# Patient Record
Sex: Female | Born: 1980 | Race: White | Hispanic: No | Smoking: Former smoker
Health system: Southern US, Community
[De-identification: ages and names within clinical notes are randomized; demographics above are authoritative.]

## PROBLEM LIST (undated history)

## (undated) ENCOUNTER — Inpatient Hospital Stay (HOSPITAL_COMMUNITY): Payer: Self-pay

## (undated) DIAGNOSIS — G709 Myoneural disorder, unspecified: Secondary | ICD-10-CM

## (undated) DIAGNOSIS — E669 Obesity, unspecified: Secondary | ICD-10-CM

## (undated) DIAGNOSIS — F319 Bipolar disorder, unspecified: Secondary | ICD-10-CM

## (undated) DIAGNOSIS — I10 Essential (primary) hypertension: Secondary | ICD-10-CM

## (undated) DIAGNOSIS — F32A Depression, unspecified: Secondary | ICD-10-CM

## (undated) DIAGNOSIS — Z8489 Family history of other specified conditions: Secondary | ICD-10-CM

## (undated) DIAGNOSIS — E119 Type 2 diabetes mellitus without complications: Secondary | ICD-10-CM

## (undated) DIAGNOSIS — K219 Gastro-esophageal reflux disease without esophagitis: Secondary | ICD-10-CM

## (undated) DIAGNOSIS — K760 Fatty (change of) liver, not elsewhere classified: Secondary | ICD-10-CM

## (undated) DIAGNOSIS — IMO0002 Reserved for concepts with insufficient information to code with codable children: Secondary | ICD-10-CM

## (undated) DIAGNOSIS — F329 Major depressive disorder, single episode, unspecified: Secondary | ICD-10-CM

## (undated) DIAGNOSIS — Z9851 Tubal ligation status: Secondary | ICD-10-CM

## (undated) DIAGNOSIS — O34219 Maternal care for unspecified type scar from previous cesarean delivery: Secondary | ICD-10-CM

## (undated) DIAGNOSIS — S329XXA Fracture of unspecified parts of lumbosacral spine and pelvis, initial encounter for closed fracture: Secondary | ICD-10-CM

## (undated) DIAGNOSIS — Z98891 History of uterine scar from previous surgery: Secondary | ICD-10-CM

## (undated) DIAGNOSIS — F419 Anxiety disorder, unspecified: Secondary | ICD-10-CM

## (undated) HISTORY — DX: Fatty (change of) liver, not elsewhere classified: K76.0

## (undated) HISTORY — DX: Obesity, unspecified: E66.9

## (undated) HISTORY — DX: Type 2 diabetes mellitus without complications: E11.9

## (undated) HISTORY — PX: CHEST TUBE INSERTION: SHX231

## (undated) HISTORY — PX: TONSILLECTOMY: SUR1361

## (undated) HISTORY — DX: Gastro-esophageal reflux disease without esophagitis: K21.9

## (undated) HISTORY — PX: TRACHEOSTOMY: SUR1362

---

## 2003-06-22 ENCOUNTER — Other Ambulatory Visit: Admission: RE | Admit: 2003-06-22 | Discharge: 2003-06-22 | Payer: Self-pay | Admitting: Obstetrics and Gynecology

## 2003-08-06 ENCOUNTER — Encounter: Admission: RE | Admit: 2003-08-06 | Discharge: 2003-08-07 | Payer: Self-pay | Admitting: Endocrinology

## 2004-02-25 ENCOUNTER — Emergency Department: Payer: Self-pay | Admitting: Emergency Medicine

## 2005-02-20 ENCOUNTER — Emergency Department: Payer: Self-pay | Admitting: Internal Medicine

## 2006-05-07 ENCOUNTER — Ambulatory Visit: Payer: Self-pay | Admitting: Vascular Surgery

## 2006-05-07 ENCOUNTER — Ambulatory Visit (HOSPITAL_COMMUNITY): Admission: RE | Admit: 2006-05-07 | Discharge: 2006-05-07 | Payer: Self-pay | Admitting: Obstetrics and Gynecology

## 2006-05-07 ENCOUNTER — Encounter: Payer: Self-pay | Admitting: Vascular Surgery

## 2006-09-01 ENCOUNTER — Inpatient Hospital Stay (HOSPITAL_COMMUNITY): Admission: AD | Admit: 2006-09-01 | Discharge: 2006-09-01 | Payer: Self-pay | Admitting: Obstetrics and Gynecology

## 2006-09-03 ENCOUNTER — Inpatient Hospital Stay (HOSPITAL_COMMUNITY): Admission: AD | Admit: 2006-09-03 | Discharge: 2006-09-03 | Payer: Self-pay | Admitting: Obstetrics and Gynecology

## 2006-09-04 ENCOUNTER — Inpatient Hospital Stay (HOSPITAL_COMMUNITY): Admission: AD | Admit: 2006-09-04 | Discharge: 2006-09-04 | Payer: Self-pay | Admitting: Obstetrics and Gynecology

## 2006-09-26 ENCOUNTER — Inpatient Hospital Stay (HOSPITAL_COMMUNITY): Admission: AD | Admit: 2006-09-26 | Discharge: 2006-09-26 | Payer: Self-pay | Admitting: Obstetrics and Gynecology

## 2006-09-28 ENCOUNTER — Inpatient Hospital Stay (HOSPITAL_COMMUNITY): Admission: RE | Admit: 2006-09-28 | Discharge: 2006-09-30 | Payer: Self-pay | Admitting: Obstetrics and Gynecology
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
## 2010-10-23 LAB — CBC
Platelets: 234
Platelets: 242
WBC: 11.5 — ABNORMAL HIGH
WBC: 9.3

## 2010-10-23 LAB — RPR: RPR Ser Ql: NONREACTIVE

## 2010-10-23 LAB — SAMPLE TO BLOOD BANK

## 2010-10-23 LAB — CCBB MATERNAL DONOR DRAW

## 2010-10-24 LAB — URINALYSIS, ROUTINE W REFLEX MICROSCOPIC
Nitrite: NEGATIVE
Protein, ur: 30 — AB
Specific Gravity, Urine: >1.03 — ABNORMAL HIGH
Urobilinogen, UA: 1

## 2010-10-24 LAB — URINE MICROSCOPIC-ADD ON

## 2011-10-05 ENCOUNTER — Encounter: Payer: Self-pay | Admitting: Internal Medicine

## 2011-10-23 ENCOUNTER — Other Ambulatory Visit: Payer: Self-pay

## 2011-10-28 ENCOUNTER — Ambulatory Visit: Payer: Self-pay | Admitting: Gastroenterology

## 2011-11-05 ENCOUNTER — Ambulatory Visit: Payer: Self-pay | Admitting: Dietician

## 2011-11-06 DIAGNOSIS — E119 Type 2 diabetes mellitus without complications: Secondary | ICD-10-CM | POA: Insufficient documentation

## 2011-12-03 ENCOUNTER — Encounter: Payer: 59 | Attending: Internal Medicine | Admitting: Dietician

## 2011-12-03 ENCOUNTER — Encounter: Payer: Self-pay | Admitting: Dietician

## 2011-12-03 VITALS — Ht 61.0 in | Wt 214.1 lb

## 2011-12-03 DIAGNOSIS — Z713 Dietary counseling and surveillance: Secondary | ICD-10-CM | POA: Insufficient documentation

## 2011-12-03 DIAGNOSIS — E119 Type 2 diabetes mellitus without complications: Secondary | ICD-10-CM | POA: Insufficient documentation

## 2011-12-03 NOTE — Patient Instructions (Addendum)
   Try to drink less diet pepsi due to the carbonic acid.  Add crystal light to the beverages.  Check some fasting glucose levels as well as post-meal and bedtime levels.    Try to record your levels and take these to MD appointment.   Try to follow the 1400 calorie diet with three carb choices (45 gm ) per meal and 15 gm snacks.

## 2011-12-03 NOTE — Progress Notes (Signed)
  Medical Nutrition Therapy:  Appt start time: 0900 end time:  1000.   Assessment:  Primary concerns today: wants to know how to eat right. Comes today with an A1C at 7.6%.  Since her last MD appointment she has lost 5.0 lbs.  Has some questions.  Expresses a desire to lose weight and to be healthier.  HYPERGLYCEMIA:  Notes that with high blood glucose, tends to be sleepy and thirsty, and her hands and feet are drier.  HYPOGLYCEMIA:  Notes at times she experiences the shakiness and when checking her blood glucose, it will be 80 mg/dl.  To treat, she "just eats something."   BLOOD GLUCOSE MONITORING:  Not consistently monitoring blood glucose.  MEDICATIONS: Medication review.      DIETARY INTAKE:  24-hr recall:  B ( AM): Up at 5:00 AM  Diet Pepsi    8:30 food:  Biscuit biscuit with egg and cheese (15 gm size) more diet pepsi or Nurtragrain bar  Snk ( AM): none  L ( PM): 11:30-12:00 balogona and egg and cheese sandwich (2 breads, white wheat) with mayo. OR leftovers.  Diet Pepsi.  Eats lunch 5 out of 7 days. Snk ( PM): 2:30-3:00 Lantz Cracker 6 pack.  Diet Pepsi D ( PM): 5:30-6:00  Tacos, soft shells, shredded cheese lettuce, tomatoes , hamburger, beans, rice and sour cream.  11/2 cups with 2 ( 8 inch flour tortilla) Diet Pepsi. Snk ( PM): sometimes popcorn or ice cream. Beverages: diet pepsi at about 1 liter per day, water  Usual physical activity: Not currently active  Estimated energy needs:  HT: 61 in  WT: 214.1 lb  BMI: 40.5 kg/m2  Adj  WT: 140 lb  (63 kg) 1300- 1400calories 150-155 g carbohydrates 100-105 g protein 35-38 g fat  Progress Towards Goal(s):  In progress.   Nutritional Diagnosis:  Vanderbilt-2.1 Inpaired nutrition utilization As related to blood glucose.  As evidenced by diagnosis of type 2 diabetes with A1C at 7.6%..    Intervention:  Nutrition review of the food groups and the impact of each on blood glucose levels.  Review of the implications of exercise/activity on  blood glucose levels.  Recommended restricting carbohydrates and more closely monitoring portion sizes.  Recommended aiming for 30-45 gm CHO per meal and 15 gm for snacks.  Recommended exploring ways to become more physically active.  Recommended that she come an use our scales on a routine basis for consistently monitoring her weight.  Recommended regular blood glucose monitoring for indications as to how her plan of action is succeeding. Provided ADA recommendations for blood glucose monitoring  Handouts given during visit include:  Living Well with Diabetes  Non-starchy Vegetable list  30 and 45 GM Carbohydrate Diet menus.  Snack list  Monitoring/Evaluation:  Dietary intake, exercise, blood glucose levels, and body weight In 8-12 weeks.Marland Kitchen

## 2011-12-13 ENCOUNTER — Encounter: Payer: Self-pay | Admitting: Dietician

## 2012-09-28 DIAGNOSIS — I10 Essential (primary) hypertension: Secondary | ICD-10-CM | POA: Insufficient documentation

## 2013-02-28 ENCOUNTER — Observation Stay (HOSPITAL_COMMUNITY)
Admission: EM | Admit: 2013-02-28 | Discharge: 2013-03-01 | Disposition: A | Discharge status: Home / Self Care | Payer: 59 | Attending: Family Medicine | Admitting: Family Medicine

## 2013-02-28 ENCOUNTER — Encounter (HOSPITAL_COMMUNITY): Payer: Self-pay | Admitting: Emergency Medicine

## 2013-02-28 ENCOUNTER — Emergency Department (HOSPITAL_COMMUNITY): Payer: 59

## 2013-02-28 DIAGNOSIS — K7689 Other specified diseases of liver: Secondary | ICD-10-CM | POA: Insufficient documentation

## 2013-02-28 DIAGNOSIS — R0602 Shortness of breath: Secondary | ICD-10-CM | POA: Insufficient documentation

## 2013-02-28 DIAGNOSIS — Z7982 Long term (current) use of aspirin: Secondary | ICD-10-CM | POA: Insufficient documentation

## 2013-02-28 DIAGNOSIS — E785 Hyperlipidemia, unspecified: Secondary | ICD-10-CM | POA: Insufficient documentation

## 2013-02-28 DIAGNOSIS — F411 Generalized anxiety disorder: Secondary | ICD-10-CM | POA: Insufficient documentation

## 2013-02-28 DIAGNOSIS — F329 Major depressive disorder, single episode, unspecified: Secondary | ICD-10-CM | POA: Insufficient documentation

## 2013-02-28 DIAGNOSIS — F17200 Nicotine dependence, unspecified, uncomplicated: Secondary | ICD-10-CM

## 2013-02-28 DIAGNOSIS — I10 Essential (primary) hypertension: Secondary | ICD-10-CM

## 2013-02-28 DIAGNOSIS — F3289 Other specified depressive episodes: Secondary | ICD-10-CM | POA: Insufficient documentation

## 2013-02-28 DIAGNOSIS — R079 Chest pain, unspecified: Principal | ICD-10-CM | POA: Insufficient documentation

## 2013-02-28 DIAGNOSIS — K219 Gastro-esophageal reflux disease without esophagitis: Secondary | ICD-10-CM | POA: Insufficient documentation

## 2013-02-28 DIAGNOSIS — E119 Type 2 diabetes mellitus without complications: Secondary | ICD-10-CM | POA: Insufficient documentation

## 2013-02-28 DIAGNOSIS — E669 Obesity, unspecified: Secondary | ICD-10-CM | POA: Insufficient documentation

## 2013-02-28 HISTORY — DX: Major depressive disorder, single episode, unspecified: F32.9

## 2013-02-28 HISTORY — DX: Essential (primary) hypertension: I10

## 2013-02-28 HISTORY — DX: Depression, unspecified: F32.A

## 2013-02-28 HISTORY — DX: Reserved for concepts with insufficient information to code with codable children: IMO0002

## 2013-02-28 HISTORY — DX: Anxiety disorder, unspecified: F41.9

## 2013-02-28 HISTORY — DX: Fracture of unspecified parts of lumbosacral spine and pelvis, initial encounter for closed fracture: S32.9XXA

## 2013-02-28 LAB — CBC
HEMATOCRIT: 43 % (ref 36.0–46.0)
Hemoglobin: 14.6 g/dL (ref 12.0–15.0)
MCH: 28.9 pg (ref 26.0–34.0)
MCHC: 34 g/dL (ref 30.0–36.0)
MCV: 85 fL (ref 78.0–100.0)
PLATELETS: 268 10*3/uL (ref 150–400)
RBC: 5.06 MIL/uL (ref 3.87–5.11)
RDW: 13.8 % (ref 11.5–15.5)
WBC: 12.3 10*3/uL — AB (ref 4.0–10.5)

## 2013-02-28 LAB — BASIC METABOLIC PANEL
BUN: 17 mg/dL (ref 6–23)
CHLORIDE: 104 meq/L (ref 96–112)
CO2: 25 meq/L (ref 19–32)
Calcium: 9 mg/dL (ref 8.4–10.5)
Creatinine, Ser: 0.63 mg/dL (ref 0.50–1.10)
GFR calc non Af Amer: >90 mL/min (ref >90)
Glucose, Bld: 235 mg/dL — ABNORMAL HIGH (ref 70–99)
POTASSIUM: 4 meq/L (ref 3.7–5.3)
Sodium: 142 mEq/L (ref 137–147)

## 2013-02-28 LAB — POCT I-STAT TROPONIN I: TROPONIN I, POC: 0.01 ng/mL (ref 0.00–0.08)

## 2013-02-28 NOTE — ED Notes (Signed)
Per pt sts that she is having chest tightness radiating down her left arm. sts started earlier after shoveling some snow. sts also she checked her BP and it was elevated. sts she took an ASA. sts some SOB. sts also she has been having some anxiety attacks over the past few weeks.

## 2013-02-28 NOTE — ED Notes (Signed)
Pt is being transported to Xray

## 2013-02-28 NOTE — ED Provider Notes (Signed)
CSN: [id number]     Arrival date & time 02/28/13  2020 History   First MD Initiated Contact with Patient 02/28/13 2040     Chief Complaint  Patient presents with  . Chest Pain    (Consider location/radiation/quality/duration/timing/severity/associated sxs/prior Treatment) HPI Comments: 33 year old female with a history of diabetes mellitus, hypertension, hyperlipidemia, obesity, tobacco use, and anxiety presents to the emergency department for chest pain. She describes the pain as a chest tightness located in her central and left chest. Patient states that symptom onset was at rest; she endorses shoveling snow earlier today. Patient has experienced similar chest pain in the past which was associated with an anxiety attack. She states she has had worsening anxiety over the past few months, but has not been taking any anxiety medication for the last 4 weeks approximately. Patient states she has been having some squeezing pain radiating down her left arm to her left elbow. She denies any modifying factors of her symptoms. She took an aspirin without relief. Symptoms associated with shortness of breath. She denies fever, syncope, jaw pain, nausea or vomiting, abdominal pain, numbness/tingling, and weakness. She endorses a FHx of ACS at unknown age in [place]; no personal ACS hx.  Patient is a 33 y.o. female presenting with chest pain. The history is provided by the patient. No language interpreter was used.  Chest Pain Associated symptoms: shortness of breath   Associated symptoms: no abdominal pain, no fever, no nausea, no numbness, not vomiting and no weakness     Past Medical History  Diagnosis Date  . GERD (gastroesophageal reflux disease)   . Fatty liver disease, nonalcoholic   . Diabetes mellitus without complication   . Obesity   . Hypertension   . Anxiety   . Depression   . Pelvis fracture   . Back fracture    Past Surgical History  Procedure Laterality Date  . Cesarean section      Family History  Problem Relation Age of Onset  . Diabetes Maternal Grandmother   . Diabetes Paternal Grandmother    History  Substance Use Topics  . Smoking status: Current Every Day Smoker -- 1.00 packs/day for 12 years    Types: Cigarettes  . Smokeless tobacco: Not on file  . Alcohol Use: No   OB History   Grav Para Term Preterm Abortions TAB SAB Ect Mult Living                 Review of Systems  Constitutional: Negative for fever.  Eyes: Negative for visual disturbance.  Respiratory: Positive for shortness of breath.   Cardiovascular: Positive for chest pain.  Gastrointestinal: Negative for nausea, vomiting and abdominal pain.  Neurological: Negative for syncope, weakness and numbness.  All other systems reviewed and are negative.     Allergies  Review of patient's allergies indicates no known allergies.  Home Medications   Current Outpatient Rx  Name  Route  Sig  Dispense  Refill  . albuterol (PROVENTIL HFA;VENTOLIN HFA) 108 (90 BASE) MCG/ACT inhaler   Inhalation   Inhale 1-2 puffs into the lungs every 6 (six) hours as needed for wheezing or shortness of breath.         Marland Kitchen aspirin 325 MG tablet   Oral   Take 325 mg by mouth once as needed (for chest pain).         Marland Kitchen lisinopril (PRINIVIL,ZESTRIL) 10 MG tablet   Oral   Take 10 mg by mouth daily.         Marland Kitchen  metFORMIN (GLUCOPHAGE-XR) 500 MG 24 hr tablet   Oral   Take 500 mg by mouth 3 (three) times daily.           BP 144/85  Pulse 95  Temp(Src) 98.7 F (37.1 C) (Oral)  Resp 24  Ht 5\' 1"  (1.549 m)  Wt 205 lb (92.987 kg)  BMI 38.75 kg/m2  SpO2 98%  Physical Exam  Nursing note and vitals reviewed. Constitutional: She is oriented to person, place, and time. She appears well-developed and well-nourished. No distress.  HENT:  Head: Normocephalic and atraumatic.  Mouth/Throat: Oropharynx is clear and moist. No oropharyngeal exudate.  Eyes: Conjunctivae and EOM are normal. Pupils are equal,  round, and reactive to light. No scleral icterus.  Neck: Normal range of motion. Neck supple.  No JVD. No carotid bruits b/l.  Cardiovascular: Normal rate, regular rhythm, normal heart sounds and intact distal pulses.   Distal radial pulses 2+ bilaterally. Intermittently tachycardic up to 103  Pulmonary/Chest: Effort normal and breath sounds normal. No respiratory distress. She has no wheezes. She has no rales.  Abdominal: Soft. There is no tenderness. There is no rebound and no guarding.  No peritoneal signs  Musculoskeletal: Normal range of motion.  Neurological: She is alert and oriented to person, place, and time.  Skin: Skin is warm and dry. No rash noted. She is not diaphoretic. No erythema. No pallor.  Psychiatric: She has a normal mood and affect. Her behavior is normal.    ED Course  Procedures (including critical care time) Labs Review Labs Reviewed  CBC - Abnormal; Notable for the following:    WBC 12.3 (*)    All other components within normal limits  BASIC METABOLIC PANEL - Abnormal; Notable for the following:    Glucose, Bld 235 (*)    All other components within normal limits  POCT I-STAT TROPONIN I  POCT I-STAT TROPONIN I   Imaging Review Dg Chest 2 View  02/28/2013   CLINICAL DATA:  Chest pain, shortness of breath.  EXAM: CHEST  2 VIEW  COMPARISON:  None.  FINDINGS: The heart size and mediastinal contours are within normal limits. Both lungs are clear. No pleural effusion or pneumothorax is noted. The visualized skeletal structures are unremarkable.  IMPRESSION: No active cardiopulmonary disease.   Electronically Signed   By: Roque Lias M.D.   On: 02/28/2013 21:48    EKG Interpretation    Date/Time:  Tuesday February 28 2013 20:22:50 EST Ventricular Rate:  107 PR Interval:  170 QRS Duration: 72 QT Interval:  326 QTC Calculation: 435 R Axis:   71 Text Interpretation:  Sinus tachycardia Otherwise normal ECG Confirmed by ZAVITZ  MD, JOSHUA ([id number]) on 02/28/2013  10:59:02 PM            MDM   Final diagnoses:  Chest pain   34 y/o female with a number of cardiac risk factors presents today for chest pain. Pain similar to prior anxiety attacks; however L arm pain is new for her which motivated her to come to the ED. Patient's ACS work up in the ED is negative today. She is chest pain free at present; only took 324 ASA at home PTA. EKG with sinus tachycardia. No STEMI or ischemic change. Doubt PE as patient without tachycardia, tachypnea, dyspnea, or hypoxia while in ED. No recent surgeries or hospitalizations. No prolonged travel. No hx of DVT/PE/coagulopathies.  Patient endorses to Dr. Jodi Mourning that chest pain began while shoveling snow and improved at rest. This  is different than the initial story I received where patient endorsed to onset and relief of symptoms while at rest. Heart score today is 3, correlating to low risk; however risk factors and presentation of symptoms warrant further inpatient obs work up for chest pain rule out. Dr. Jodi MourningZavitz to consult with hospitalist regarding admission.  Antony MaduraKelly Brenda Samano, PA-C 03/01/13 [phone]

## 2013-02-28 NOTE — ED Notes (Signed)
Pt states she wants to leave.

## 2013-03-01 DIAGNOSIS — R079 Chest pain, unspecified: Secondary | ICD-10-CM | POA: Diagnosis present

## 2013-03-01 LAB — POCT I-STAT TROPONIN I: TROPONIN I, POC: 0 ng/mL (ref 0.00–0.08)

## 2013-03-01 MED ORDER — ONDANSETRON HCL 4 MG/2ML IJ SOLN: 4.0000 mg | Freq: Three times a day (TID) | INTRAMUSCULAR | Status: DC | PRN

## 2013-03-01 NOTE — ED Provider Notes (Signed)
Medical screening examination/treatment/procedure(s) were conducted as a shared visit with non-physician practitioner(s) or resident  and myself.  I personally evaluated the patient during the encounter and agree with the findings and plan unless otherwise indicated.    I have personally reviewed any xrays and/ or EKG's with the provider and I agree with interpretation.   Chest tightness anterior since shoveling snow, resolved once stopped shoveling. Pt has had intermittent similar sxs but this is the first time with exertion.  She has been stressed recently, anxiety hx but this is more severe.  Pt has multiple risk factors for cardiac.  Patient denies blood clot history, active cancer, recent major trauma or surgery, unilateral leg swelling/ pain, recent long travel, hemoptysis or oral contraceptives.  CP resolved on arrival to ED, no cp during workup, delta troponin neg.  With exertional chest pain and risk factors I recommended observation/ tele for serial troponins and likely stress test in am.  No cp on recheck.    Spoke with Dr Onalee Huaavid regarding observation, a long discussion regarding patient's work up and risk factors and we felt patient needed serial troponins and stress test tomorrow.  Troponins neg in ED, no cp currently, I did not feel cardiology needed to be consulted at 1230 am to order a stress test for tomorrow.  Dr Onalee Huaavid stated she would not see the patient until cardiology consulted.  I felt it was more important to take care of the patient. We officially consulted Dr Onalee Huaavid to see the patient in the ED and she could make her own decision whether she wanted cardiology involved emergently after seeing the patient herself and furthermore if she felt patient safe for discharge home then she could do so from the ER herself.    Repaged and spoke with Dr Onalee Huaavid twice.  After explaining how frustrated the patient was she said she was busy with another patient and would try and find time to see the  patient in the near future.  The husband overheard the difficulties in trying to have medicine see his wife and then became upset, the couple wanted to leave and go to another hospital.  Patient now refusing to see Dr Onalee Huaavid, paged cardiology. I called Dr Tresa EndoKelly cardiology to assist with patient care, he said he would come down and see the patient to either arrange observation for stress or close outpatient follow follow up for stress test.     Filed Vitals:   03/01/13 0130  BP: 153/99  Pulse: 82  Temp:   Resp: 31    Chest pain, HTN, DM, Smoker   Enid SkeensJoshua M Terren Haberle, MD 03/01/13 [phone]

## 2013-03-01 NOTE — ED Notes (Signed)
Pt very upset. Pt states she wants to go home. Pt unhooked herself from VS machine.

## 2013-03-01 NOTE — Consult Note (Signed)
Reason for Consult: chest pain Referring Physician: Dr. Eugenia Pancoast [name] [name] is an 33 y.o. female.  HPI: 33 yo woman with PMH of GERD, T2DM, hypertension, dyslipidemia, anxiety, tobacco use who presents with chest pain. [name] [name] tells me last summer should could walk down her road 2.5 miles and back without issues but now she's limited by her legs - she has an achilles issues (left-side) and would also develop shortness of breath. 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 She will sometimes have associated shortness of breath. Earlier today she was shoveling snow and had chest pressure after a while that lasted for several minutes until she stopped. This evening around 7 pm she had left-shoulder pain to left elbow pain begin, more of a throbbing sensation with subsequent development of central chest pressure. She checked her blood pressure which was 160s/90s and spoke with her mother who called her husband and brought her to the ER. In the ER she's been chest pain free, unrevealing ECG, negative cardiac biomarkers over 3 hours. She was going to be observed overnight with plans for a stress test in the AM but there was an unforeseen delay and Cardiology was consulted to help risk stratify.   Past Medical History  Diagnosis Date  . GERD (gastroesophageal reflux disease)   . Fatty liver disease, nonalcoholic   . Diabetes mellitus without complication   . Obesity   . Hypertension   . Anxiety   . Depression   . Pelvis fracture   . Back fracture     Past Surgical History  Procedure Laterality Date  . Cesarean section      Family History  Problem Relation Age of Onset  . Diabetes Maternal Grandmother   . Diabetes Paternal Grandmother     Social History:  reports that she has been smoking Cigarettes.  She has a 12 pack-year smoking history.  She does not have any smokeless tobacco history on file. She reports that she does not drink alcohol or use illicit drugs. Family history as above, doesn't know it perfectly, no known sudden death or MIs  Allergies: No Known Allergies  Medications: I have reviewed the patient's current medications. Prior to Admission:  (Not in a hospital admission) Scheduled:   Results for orders placed during the hospital encounter of 02/28/13 (from the past 48 hour(s))  CBC     Status: Abnormal   Collection Time    02/28/13  8:28 PM      Result Value Ref Range   WBC 12.3 (*) 4.0 - 10.5 K/uL   RBC 5.06  3.87 - 5.11 MIL/uL   Hemoglobin 14.6  12.0 - 15.0 g/dL   HCT 43.0  36.0 - 46.0 %   MCV 85.0  78.0 - 100.0 fL   MCH 28.9  26.0 - 34.0 pg   MCHC 34.0  30.0 - 36.0 g/dL   RDW 13.8  11.5 - 15.5 %   Platelets 268  150 - 400 K/uL  BASIC METABOLIC PANEL     Status: Abnormal   Collection Time    02/28/13  8:28 PM      Result Value Ref Range   Sodium 142  137 - 147 mEq/L   Potassium 4.0  3.7 - 5.3 mEq/L   Chloride 104  96 - 112 mEq/L   CO2 25  19 - 32 mEq/L  Glucose, Bld 235 (*) 70 - 99 mg/dL   BUN 17  6 - 23 mg/dL   Creatinine, Ser 0.63  0.50 - 1.10 mg/dL   Calcium 9.0  8.4 - 10.5 mg/dL   GFR calc non Af Amer >90  >90 mL/min   GFR calc Af Amer >90  >90 mL/min   Comment: (NOTE)     The eGFR has been calculated using the CKD EPI equation.     This calculation has not been validated in all clinical situations.     eGFR's persistently <90 mL/min signify possible Chronic Kidney     Disease.  POCT I-STAT TROPONIN I     Status: None   Collection Time    02/28/13  8:57 PM      Result Value Ref Range   Troponin i, poc 0.01  0.00 - 0.08 ng/mL   Comment 3            Comment: Due to the release kinetics of cTnI,     a negative result within the first hours     of the onset of symptoms does not rule out     myocardial infarction with certainty.     If myocardial infarction is still suspected,      repeat the test at appropriate intervals.  POCT I-STAT TROPONIN I     Status: None   Collection Time    03/01/13 12:04 AM      Result Value Ref Range   Troponin i, poc 0.00  0.00 - 0.08 ng/mL   Comment 3            Comment: Due to the release kinetics of cTnI,     a negative result within the first hours     of the onset of symptoms does not rule out     myocardial infarction with certainty.     If myocardial infarction is still suspected,     repeat the test at appropriate intervals.    Dg Chest 2 View  02/28/2013   CLINICAL DATA:  Chest pain, shortness of breath.  EXAM: CHEST  2 VIEW  COMPARISON:  None.  FINDINGS: The heart size and mediastinal contours are within normal limits. Both lungs are clear. No pleural effusion or pneumothorax is noted. The visualized skeletal structures are unremarkable.  IMPRESSION: No active cardiopulmonary disease.   Electronically Signed   By: Sabino Dick M.D.   On: 02/28/2013 21:48    Review of Systems  Constitutional: Negative for fever, chills and weight loss.  HENT: Negative for hearing loss and tinnitus.   Eyes: Negative for blurred vision, photophobia and pain.  Respiratory: Positive for shortness of breath. Negative for cough.   Cardiovascular: Positive for chest pain. Negative for orthopnea and leg swelling.  Gastrointestinal: Negative for nausea, vomiting and abdominal pain.  Genitourinary: Negative for dysuria and frequency.  Musculoskeletal: Positive for joint pain. Negative for myalgias.  Skin: Negative for rash.  Neurological: Negative for dizziness, tingling and headaches.  Endo/Heme/Allergies: Negative for polydipsia. Does not bruise/bleed easily.  Psychiatric/Behavioral: Negative for depression and suicidal ideas. The patient is nervous/anxious.    Blood pressure 153/99, pulse 82, temperature 98.7 F (37.1 C), temperature source Oral, resp. rate 31, height _0  (1.549 m), weight 92.987 kg (205 lb), SpO2 96.00%. Physical Exam    Nursing note and vitals reviewed. Constitutional: She is oriented to person, place, and time. She appears well-developed and well-nourished. No distress.  HENT:  Head: Normocephalic and atraumatic.  Nose: Nose normal.  Mouth/Throat: Oropharynx is clear and moist. No oropharyngeal exudate.  Neck: Normal range of motion. Neck supple. No JVD present. No tracheal deviation present.  Cardiovascular: Normal rate, regular rhythm, normal heart sounds and intact distal pulses.  Exam reveals no gallop.   No murmur heard. Respiratory: Effort normal and breath sounds normal. No respiratory distress. She has no wheezes. She has no rales.  GI: Soft. Bowel sounds are normal. She exhibits no distension. There is no tenderness.  Musculoskeletal: Normal range of motion. She exhibits no edema and no tenderness.  Neurological: She is alert and oriented to person, place, and time. No cranial nerve deficit. Coordination normal.  Skin: Skin is warm and dry. No rash noted. She is not diaphoretic. No erythema.  Psychiatric: She has a normal mood and affect. Her behavior is normal.  labs reviewed as above Chest x-ray unrevealing ECG unrevealing: sinus tachycardia, 107, normal axis, no ST-T changes  Problem List Chest Pain Dyslipidemia Hypertension T2DM Anxiety Tobacco abuse Current Facility-Administered Medications  Medication Dose Route Frequency Provider Last Rate Last Dose  . ondansetron (ZOFRAN) injection 4 mg  4 mg Intravenous Q8H PRN Mariea Clonts, MD       Current Outpatient Prescriptions  Medication Sig Dispense Refill  . albuterol (PROVENTIL HFA;VENTOLIN HFA) 108 (90 BASE) MCG/ACT inhaler Inhale 1-2 puffs into the lungs every 6 (six) hours as needed for wheezing or shortness of breath.      Marland Kitchen aspirin 325 MG tablet Take 325 mg by mouth once as needed (for chest pain).      Marland Kitchen lisinopril (PRINIVIL,ZESTRIL) 10 MG tablet Take 10 mg by mouth daily.      . metFORMIN (GLUCOPHAGE-XR) 500 MG 24 hr tablet  Take 500 mg by mouth 3 (three) times daily.         Assessment/Plan: [name] [name] is a 33 yo woman with PMH of HTN, T2DM, tobacco use for 15+ years 1-2 ppd, dyslipidemia who presents with chest pain. Differential diagnosis for her chest pain is musculoskeletal, exertion (shoveling snow), anxiety, hypertension related (microvascular disease) or typical/coronary ischemic related. I favor a diagnosis of atypical chest pain related to hypertension and/or anxiety, however, her symptoms have some typical components (occurs with exertion, improves with rest, no NTG given to assess) and she has significant risk factors but early age. HEART score 3 driven by risk factors. The most conservative strategy which I presented to [name] [name] and of which I would prefer is to have a stress test in the AM (very soon). However, she is adamant about leaving after her delay in being seen and I explained her population level risk as being over-all very low driven by her young age. She understands that she has significant risk factors that substantially increase her risk. She knows that this pain may very well be ischemic and origin and we discussed warning signs and importance to return ASAP or call 911 should symptoms recur before being seen and receiving a stress test, ideally in the next several days. We also discussed strategies to quit tobacco including nicotine gum, patch and medications to assist (wellbutrin) which she will consider and speak with cardiology and/or her PCP. - continue home diabetes, hypertension, lipid  medications - follow-up and stress test ASAP with cardiology - order placed in the ER and she knows to call first thing in the AM - tobacco smoking cessation - based on testing (if negative), diet, gentle exercise/increased physical activity  [name] [name] 03/01/2013, 2:57 AM

## 2013-03-01 NOTE — Discharge Instructions (Signed)
Chest Pain (Nonspecific) °It is often hard to give a specific diagnosis for the cause of chest pain. There is always a chance that your pain could be related to something serious, such as a heart attack or a blood clot in the lungs. You need to follow up with your caregiver for further evaluation. °CAUSES  °· Heartburn. °· Pneumonia or bronchitis. °· Anxiety or stress. °· Inflammation around your heart (pericarditis) or lung (pleuritis or pleurisy). °· A blood clot in the lung. °· A collapsed lung (pneumothorax). It can develop suddenly on its own (spontaneous pneumothorax) or from injury (trauma) to the chest. °· Shingles infection (herpes zoster virus). °The chest wall is composed of bones, muscles, and cartilage. Any of these can be the source of the pain. °· The bones can be bruised by injury. °· The muscles or cartilage can be strained by coughing or overwork. °· The cartilage can be affected by inflammation and become sore (costochondritis). °DIAGNOSIS  °Lab tests or other studies, such as X-rays, electrocardiography, stress testing, or cardiac imaging, may be needed to find the cause of your pain.  °TREATMENT  °· Treatment depends on what may be causing your chest pain. Treatment may include: °· Acid blockers for heartburn. °· Anti-inflammatory medicine. °· Pain medicine for inflammatory conditions. °· Antibiotics if an infection is present. °· You may be advised to change lifestyle habits. This includes stopping smoking and avoiding alcohol, caffeine, and chocolate. °· You may be advised to keep your head raised (elevated) when sleeping. This reduces the chance of acid going backward from your stomach into your esophagus. °· Most of the time, nonspecific chest pain will improve within 2 to 3 days with rest and mild pain medicine. °HOME CARE INSTRUCTIONS  °· If antibiotics were prescribed, take your antibiotics as directed. Finish them even if you start to feel better. °· For the next few days, avoid physical  activities that bring on chest pain. Continue physical activities as directed. °· Do not smoke. °· Avoid drinking alcohol. °· Only take over-the-counter or prescription medicine for pain, discomfort, or fever as directed by your caregiver. °· Follow your caregiver's suggestions for further testing if your chest pain does not go away. °· Keep any follow-up appointments you made. If you do not go to an appointment, you could develop lasting (chronic) problems with pain. If there is any problem keeping an appointment, you must call to reschedule. °SEEK MEDICAL CARE IF:  °· You think you are having problems from the medicine you are taking. Read your medicine instructions carefully. °· Your chest pain does not go away, even after treatment. °· You develop a rash with blisters on your chest. °SEEK IMMEDIATE MEDICAL CARE IF:  °· You have increased chest pain or pain that spreads to your arm, neck, jaw, back, or abdomen. °· You develop shortness of breath, an increasing cough, or you are coughing up blood. °· You have severe back or abdominal pain, feel nauseous, or vomit. °· You develop severe weakness, fainting, or chills. °· You have a fever. °THIS IS AN EMERGENCY. Do not wait to see if the pain will go away. Get medical help at once. Call your local emergency services (911 in [place] Do not drive yourself to the hospital. °MAKE SURE YOU:  °· Understand these instructions. °· Will watch your condition. °· Will get help right away if you are not doing well or get worse. °Document Released: 10/08/2004 Document Revised: 03/23/2011 Document Reviewed: 08/04/2007 °[employer]® Patient Information ©2014 [employer]   [employer]. ° °

## 2013-03-01 NOTE — ED Notes (Signed)
Cardiology at [facility]

## 2013-03-02 NOTE — ED Provider Notes (Signed)
Medical screening examination/treatment/procedure(s) were conducted as a shared visit with non-physician practitioner(s) or resident  and myself.  I personally evaluated the patient during the encounter and agree with the findings and plan unless otherwise indicated.    I have personally reviewed any xrays and/ or EKG's with the provider and I agree with interpretation.   See separate note.  Enid SkeensJoshua M Lathaniel Legate, MD 03/02/13 [phone]

## 2013-03-16 ENCOUNTER — Encounter: Payer: Self-pay | Admitting: General Surgery

## 2013-03-16 ENCOUNTER — Encounter: Payer: Self-pay | Admitting: Cardiovascular Disease

## 2013-03-16 ENCOUNTER — Ambulatory Visit (INDEPENDENT_AMBULATORY_CARE_PROVIDER_SITE_OTHER): Payer: 59 | Admitting: Cardiovascular Disease

## 2013-03-16 VITALS — BP 120/82 | HR 64 | Ht 61.0 in | Wt 212.0 lb

## 2013-03-16 DIAGNOSIS — F99 Mental disorder, not otherwise specified: Secondary | ICD-10-CM | POA: Insufficient documentation

## 2013-03-16 DIAGNOSIS — F419 Anxiety disorder, unspecified: Secondary | ICD-10-CM | POA: Insufficient documentation

## 2013-03-16 DIAGNOSIS — F17200 Nicotine dependence, unspecified, uncomplicated: Secondary | ICD-10-CM

## 2013-03-16 DIAGNOSIS — F411 Generalized anxiety disorder: Secondary | ICD-10-CM

## 2013-03-16 DIAGNOSIS — Z720 Tobacco use: Secondary | ICD-10-CM | POA: Insufficient documentation

## 2013-03-16 DIAGNOSIS — R079 Chest pain, unspecified: Secondary | ICD-10-CM

## 2013-03-16 NOTE — Assessment & Plan Note (Signed)
Significant weekly issue Suggested behavioral health referral and counseling  F/U primary to discuss

## 2013-03-16 NOTE — Assessment & Plan Note (Signed)
Atypical  F/U stress echo

## 2013-03-16 NOTE — Addendum Note (Signed)
Addended by: Nita SellsORSON, Xadrian Craighead L on: 03/16/2013 11:01 AM   Modules accepted: Orders

## 2013-03-16 NOTE — Assessment & Plan Note (Signed)
Smoking cessation instruction/counseling given:  counseled patient on the dangers of tobacco use, advised patient to stop smoking, and reviewed strategies to maximize success 

## 2013-03-16 NOTE — Patient Instructions (Signed)
Your physician recommends that you continue on your current medications as directed. Please refer to the Current Medication list given to you today.  Your physician has requested that you have a stress echocardiogram. For further information please visit [email or web address]. Please follow instruction sheet as given.  Your physician recommends that you schedule a follow-up appointment as needed with Dr Eden EmmsNishan

## 2013-03-16 NOTE — Progress Notes (Signed)
Patient ID: [name] [name], female   DOB: 11/06/1980, 33 y.o.   MRN: [id number]  33 yo seen in ER by Fellow 2/19 for atypical chest pain  She has PMH of GERD, T2DM, hypertension, dyslipidemia, anxiety, tobacco use who presented  with chest pain. [name] [name] tells me last summer should could walk down her road 2.5 miles and back without issues but now she's limited by her legs - she has an achilles issues (left-side) and would also develop shortness of breath. 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 She will sometimes have associated shortness of breath. Earlier today she was shoveling snow and had chest pressure after a while that lasted for several minutes until she stopped. This evening around 7 pm she had left-shoulder pain to left elbow pain begin, more of a throbbing sensation with subsequent development of central chest pressure. She checked her blood pressure which was 160s/90s and spoke with her mother who called her husband and brought her to the ER. In the ER she's been chest pain free, unrevealing ECG, negative cardiac biomarkers over 3 hours. She was going to be observed overnight with plans for a stress test in the AM but  hospitalist wouldn't admit and she decided to leave  Continues to have atypical sharp SSCP in chest. Has anxiety attacks frequently during week.  No counselor  Smokes for over 25 years Pneumonia last December and quit for 3 weeks but resumed Husband smokes as well.  CXR in ER ok     ROS: Denies fever, malais, weight loss, blurry vision, decreased visual acuity, cough, sputum, SOB, hemoptysis, pleuritic pain, palpitaitons, heartburn, abdominal pain, melena, lower extremity edema, claudication, or rash.  All other systems reviewed and negative   General: Affect appropriate Obese white female  HEENT: normal Neck supple  with no adenopathy JVP normal no bruits no thyromegaly Lungs clear with no wheezing and good diaphragmatic motion Heart:  S1/S2 no murmur,rub, gallop or click PMI normal Abdomen: benighn, BS positve, no tenderness, no AAA no bruit.  No HSM or HJR Distal pulses intact with no bruits No edema Neuro non-focal Skin warm and dry No muscular weakness  Medications Current Outpatient Prescriptions  Medication Sig Dispense Refill  . ALPRAZolam (XANAX) 0.5 MG tablet Take 0.5 mg by mouth at bedtime as needed for anxiety (as needed).      . ALPRAZolam (XANAX) 1 MG tablet Take 1 mg by mouth at bedtime as needed for anxiety.      Marland Kitchen aspirin 325 MG tablet Take 325 mg by mouth once as needed (for chest pain).      Marland Kitchen lisinopril (PRINIVIL,ZESTRIL) 10 MG tablet Take 10 mg by mouth daily.      . metFORMIN (GLUCOPHAGE-XR) 500 MG 24 hr tablet Take 500 mg by mouth 3 (three) times daily.        No current facility-administered medications for this visit.    Allergies Review of patient's allergies indicates no known allergies.  Family History: Family History  Problem Relation Age of Onset  . Diabetes Maternal Grandmother   . Diabetes Paternal Grandmother     Social History: History   Social History  . Marital Status: Married    Spouse Name: N/A    Number of Children: N/A  . Years of Education: N/A   Occupational History  . Not on file.   Social  History Main Topics  . Smoking status: Current Every Day Smoker -- 1.00 packs/day for 12 years    Types: Cigarettes  . Smokeless tobacco: Not on file  . Alcohol Use: No  . Drug Use: No  . Sexual Activity: Not on file   Other Topics Concern  . Not on file   Social History Narrative  . No narrative on file    Electrocardiogram:  SR rate 107 normal   Assessment and Plan

## 2013-04-05 ENCOUNTER — Other Ambulatory Visit (HOSPITAL_COMMUNITY): Payer: 59

## 2013-11-08 LAB — OB RESULTS CONSOLE HIV ANTIBODY (ROUTINE TESTING): HIV: NONREACTIVE

## 2013-11-08 LAB — OB RESULTS CONSOLE GC/CHLAMYDIA
Chlamydia: NEGATIVE
GC PROBE AMP, GENITAL: NEGATIVE

## 2013-11-08 LAB — OB RESULTS CONSOLE ABO/RH: RH Type: POSITIVE

## 2013-11-08 LAB — OB RESULTS CONSOLE RPR: RPR: NONREACTIVE

## 2013-11-08 LAB — OB RESULTS CONSOLE HEPATITIS B SURFACE ANTIGEN: Hepatitis B Surface Ag: NEGATIVE

## 2013-11-08 LAB — OB RESULTS CONSOLE RUBELLA ANTIBODY, IGM: RUBELLA: NON-IMMUNE/NOT IMMUNE

## 2013-11-08 LAB — OB RESULTS CONSOLE ANTIBODY SCREEN: ANTIBODY SCREEN: NEGATIVE

## 2013-11-15 ENCOUNTER — Encounter: Payer: Self-pay | Admitting: *Deleted

## 2013-11-15 ENCOUNTER — Encounter: Payer: Medicaid Other | Attending: Obstetrics and Gynecology | Admitting: *Deleted

## 2013-11-15 VITALS — Ht 61.5 in | Wt 204.7 lb

## 2013-11-15 DIAGNOSIS — Z713 Dietary counseling and surveillance: Secondary | ICD-10-CM | POA: Insufficient documentation

## 2013-11-15 DIAGNOSIS — O24111 Pre-existing diabetes mellitus, type 2, in pregnancy, first trimester: Secondary | ICD-10-CM

## 2013-11-15 NOTE — Patient Instructions (Signed)
Plan:  Aim for 2-3 Carb Choices per meal (30-45 grams)   Aim for 1-2 Carbs per snack if hungry  Include protein in moderation with your meals and snacks Consider reading food labels for Total Carbohydrate of foods Call your MD for Rx for Accu chek strips and Fast clix drums Check BG before breakfast and 2 hours after each meal and record on Log Sheet Take your insulin as directed  Call MD if BG drops below 65 or goes above 140 mg/dl

## 2013-11-21 NOTE — Progress Notes (Signed)
Diabetes Self-Management Education  Visit Type:  Initial  Appt. Start Time: 1130 Appt. End Time: 1300  11/21/2013  [name], identified by name and date of birth, is a 33 y.o. female with a diagnosis of Diabetes: Type 2.  Other people present during visit:  Patient, Parent   ASSESSMENT  Height 5' 1.5" (1.562 m), weight 204 lb 11.2 oz (92.851 kg). Body mass index is 38.06 kg/(m^2).  Initial Visit Information:  Are you currently following a meal plan?: No   Are you taking your medications as prescribed?: No          Psychosocial:     Self-care barriers: None Self-management support: Doctor's office, CDE visits, Family Other persons present: Patient, Parent Patient Concerns: Nutrition/Meal planning, Medication, Monitoring Preferred Learning Style: Auditory, Hands on Learning Readiness: Change in progress  Complications:   Last HgB A1C per patient/outside source: 6.9 mg/dL How often do you check your blood sugar?: 1-2 times/day Fasting Blood glucose range (mg/dL): [phone] Have you had a dilated eye exam in the past 12 months?: Yes Have you had a dental exam in the past 12 months?: No  Diet Intake:  Breakfast: sweetened or unsweetened cereal with 2% milk OR 2 eggs occasionally with toast and PNB, diet soda Snack (morning): no Lunch: carrots or a salad, skips often in an effort to keep BG down Snack (afternoon): occasionally yogurt or granola bar Dinner: meat, avoids any starch, may have vegetable in a salad Snack (evening): no Beverage(s): diet soda or water  Exercise:  Exercise: ADL's  Individualized Plan for Diabetes Self-Management Training:   Learning Objective:  Patient will have a greater understanding of diabetes self-management.  Patient education plan per assessed needs and concerns is to attend individual sessions for     Education Topics Reviewed with Patient Today:  Definition of diabetes, type 1 and 2, and the diagnosis of diabetes (DM  2 Diabetes and pregnancy) Carbohydrate counting, Food label reading, portion sizes and measuring food., Role of diet in the treatment of diabetes and the relationship between the three main macronutrients and blood glucose level, Meal timing in regards to the patients' current diabetes medication. Role of exercise on diabetes management, blood pressure control and cardiac health. Taught/reviewed insulin injection, site rotation, insulin storage and needle disposal., Reviewed patients medication for diabetes, action, purpose, timing of dose and side effects. (Taught how to mix NPH and Regular insulin) Identified appropriate SMBG and/or A1C goals., Purpose and frequency of SMBG. (SMBG target ranges during pregnancy) Taught treatment of hypoglycemia - the 15 rule.          PATIENTS GOALS/Plan (Developed by the patient):  Nutrition: Follow meal plan discussed Medications: take my medication as prescribed Monitoring : test blood glucose pre and post meals as discussed  Plan:   Patient Instructions  Plan:  Aim for 2-3 Carb Choices per meal (30-45 grams)   Aim for 1-2 Carbs per snack if hungry  Include protein in moderation with your meals and snacks Consider reading food labels for Total Carbohydrate of foods Call your MD for Rx for Accu chek strips and Fast clix drums Check BG before breakfast and 2 hours after each meal and record on Log Sheet Take your insulin as directed  Call MD if BG drops below 65 or goes above 140 mg/dl      Expected Outcomes:  Demonstrated interest in learning. Expect positive outcomes  Education material provided: Living Well with Diabetes, Meal plan card and Carbohydrate counting sheet, GDM Handout  If problems or questions, patient to contact team via:  Phone  Future DSME appointment: PRN

## 2014-01-12 HISTORY — DX: Maternal care for unspecified type scar from previous cesarean delivery: O34.219

## 2014-03-05 ENCOUNTER — Encounter (HOSPITAL_COMMUNITY): Payer: Self-pay | Admitting: *Deleted

## 2014-03-05 ENCOUNTER — Inpatient Hospital Stay (HOSPITAL_COMMUNITY)
Admission: AD | Admit: 2014-03-05 | Discharge: 2014-03-05 | Disposition: A | Discharge status: Home / Self Care | Payer: Medicaid Other | Source: Ambulatory Visit | Attending: Obstetrics and Gynecology | Admitting: Obstetrics and Gynecology

## 2014-03-05 DIAGNOSIS — Z3A26 26 weeks gestation of pregnancy: Secondary | ICD-10-CM | POA: Insufficient documentation

## 2014-03-05 DIAGNOSIS — O9989 Other specified diseases and conditions complicating pregnancy, childbirth and the puerperium: Secondary | ICD-10-CM | POA: Insufficient documentation

## 2014-03-05 DIAGNOSIS — R109 Unspecified abdominal pain: Secondary | ICD-10-CM | POA: Insufficient documentation

## 2014-03-05 DIAGNOSIS — O4702 False labor before 37 completed weeks of gestation, second trimester: Secondary | ICD-10-CM

## 2014-03-05 LAB — URINALYSIS, ROUTINE W REFLEX MICROSCOPIC
Bilirubin Urine: NEGATIVE
Glucose, UA: NEGATIVE mg/dL
HGB URINE DIPSTICK: NEGATIVE
Ketones, ur: 15 mg/dL — AB
LEUKOCYTES UA: NEGATIVE
NITRITE: NEGATIVE
Protein, ur: NEGATIVE mg/dL
UROBILINOGEN UA: 0.2 mg/dL (ref 0.0–1.0)
pH: 5.5 (ref 5.0–8.0)

## 2014-03-05 LAB — AMNISURE RUPTURE OF MEMBRANE (ROM) NOT AT ARMC: Amnisure ROM: NEGATIVE

## 2014-03-05 LAB — POCT FERN TEST

## 2014-03-05 NOTE — MAU Note (Signed)
Urine in lab 

## 2014-03-05 NOTE — MAU Note (Signed)
Lower & mid abdominal cramping & pressure since 0630, every 25-30 minutes.  Denies bleeding or LOF.

## 2014-03-05 NOTE — Discharge Instructions (Signed)

## 2014-03-05 NOTE — MAU Note (Signed)
?   SROM @ 0815 this AM; c/o intermittent lower abdominal pressure since 0630 this AM;

## 2014-03-05 NOTE — MAU Provider Note (Signed)
Chief Complaint:  Abdominal Pain and pelvic pressure    First Provider Initiated Contact with Patient 03/05/14 1403      HPI: [name] [name] is a 34 y.o. G2P1001 at 101w3d who presents to maternity admissions reporting onset of cramping and pelvic pressure at 6 am this morning with cramp every 30 minutes to 1 hour, then a gush of fluid about an hour ago.  She reports she thinks she just peed on herself but wants to make sure.  She is having some leakage each time she feels a contractions but is not requiring a pad for this.  She reports good fetal movement, denies vaginal bleeding, vaginal itching/burning, urinary symptoms, h/a, dizziness, n/v, or fever/chills.    Past Medical History: Past Medical History  Diagnosis Date  . GERD (gastroesophageal reflux disease)   . Fatty liver disease, nonalcoholic   . Diabetes mellitus without complication   . Obesity   . Hypertension   . Anxiety   . Depression   . Pelvis fracture   . Back fracture     Past obstetric history: OB History  Gravida Para Term Preterm AB SAB TAB Ectopic Multiple Living  # Outcome Date GA Lbr Len/2nd Weight Sex Delivery Anes PTL Lv  2 Current           1 Term               Past Surgical History: Past Surgical History  Procedure Laterality Date  . Cesarean section    . Tonsillectomy      Family History: Family History  Problem Relation Age of Onset  . Diabetes Maternal Grandmother   . Diabetes Paternal Grandmother     Social History: History  Substance Use Topics  . Smoking status: Current Every Day Smoker -- 1.00 packs/day for 12 years    Types: Cigarettes  . Smokeless tobacco: Not on file  . Alcohol Use: No    Allergies: No Known Allergies  Meds:  Prescriptions prior to admission  Medication Sig Dispense Refill Last Dose  . insulin NPH Human (HUMULIN N,NOVOLIN N) 100 UNIT/ML injection Inject 12-14 Units into the skin 2 (two) times daily before a meal. Pt takes 14 units in the  AM and 12 units in the PM.   03/05/2014 at Unknown time  . insulin regular (NOVOLIN R,HUMULIN R) 100 units/mL injection Inject 10 Units into the skin 2 (two) times daily before a meal. Pt takes 10 units in the AM and 10 units in the PM.   03/05/2014 at Unknown time  . Prenatal Vit-Fe Fumarate-FA (BL PRENATAL VITAMINS PO) Take by mouth.   03/05/2014 at Unknown time    ROS: Pertinent findings in history of present illness.  Physical Exam  Blood pressure 128/64, pulse 81, temperature 97.8 F (36.6 C), temperature source Oral, resp. rate 18. GENERAL: Well-developed, well-nourished female in no acute distress.  HEENT: normocephalic HEART: normal rate RESP: normal effort ABDOMEN: Soft, non-tender, gravid appropriate for gestational age EXTREMITIES: Nontender, no edema NEURO: alert and oriented Pelvic exam: Cervix pink, visually closed, without lesion, scant white creamy discharge, negative pooling of fluid, vaginal walls and external genitalia normal Cervix 0/thick/high/posterior     FHT:  Baseline 145, moderate variability, accelerations present, no decelerations Contractions: None on toco or to palpation   Labs: Results for orders placed or performed during the hospital encounter of 03/05/14 (from the past 24 hour(s))  Urinalysis, Routine w  reflex microscopic     Status: Abnormal   Collection Time: 03/05/14 11:53 AM  Result Value Ref Range   Color, Urine YELLOW YELLOW   APPearance CLEAR CLEAR   Specific Gravity, Urine <1.005 (L) 1.005 - 1.030   pH 5.5 5.0 - 8.0   Glucose, UA NEGATIVE NEGATIVE mg/dL   Hgb urine dipstick NEGATIVE NEGATIVE   Bilirubin Urine NEGATIVE NEGATIVE   Ketones, ur 15 (A) NEGATIVE mg/dL   Protein, ur NEGATIVE NEGATIVE mg/dL   Urobilinogen, UA 0.2 0.0 - 1.0 mg/dL   Nitrite NEGATIVE NEGATIVE   Leukocytes, UA NEGATIVE NEGATIVE  Fern Test     Status: Normal   Collection Time: 03/05/14  1:28 PM  Result Value Ref Range   POCT Fern Test    Amnisure rupture of  membrane (rom)     Status: None   Collection Time: 03/05/14  2:05 PM  Result Value Ref Range   Amnisure ROM NEGATIVE     Assessment: 1. Threatened preterm labor, second trimester     Plan: Consult Dr Senaida Oresichardson Discharge home Increase PO fluids PTL precautions and fetal kick counts Return to MAU as needed      Follow-up Information    Follow up with Oliver PilaICHARDSON,KATHY W, MD.   Specialty:  Obstetrics and Gynecology   Contact information:   [address] [place] [id number] [phone]        Medication List    TAKE these medications        BL PRENATAL VITAMINS PO  Take by mouth.     insulin NPH Human 100 UNIT/ML injection  Commonly known as:  HUMULIN N,NOVOLIN N  Inject 12-14 Units into the skin 2 (two) times daily before a meal. Pt takes 14 units in the AM and 12 units in the PM.     insulin regular 100 units/mL injection  Commonly known as:  NOVOLIN R,HUMULIN R  Inject 10 Units into the skin 2 (two) times daily before a meal. Pt takes 10 units in the AM and 10 units in the PM.        Sharen CounterLisa Leftwich-Kirby Certified Nurse-Midwife 03/05/2014 3:20 PM

## 2014-04-18 ENCOUNTER — Encounter (HOSPITAL_COMMUNITY): Payer: Self-pay | Admitting: Obstetrics and Gynecology

## 2014-04-18 DIAGNOSIS — O34219 Maternal care for unspecified type scar from previous cesarean delivery: Secondary | ICD-10-CM

## 2014-05-08 ENCOUNTER — Encounter (HOSPITAL_COMMUNITY): Payer: Self-pay | Admitting: *Deleted

## 2014-05-08 ENCOUNTER — Inpatient Hospital Stay (HOSPITAL_COMMUNITY)
Admission: AD | Admit: 2014-05-08 | Discharge: 2014-05-08 | Disposition: A | Discharge status: Home / Self Care | Payer: Medicaid Other | Source: Ambulatory Visit | Attending: Obstetrics and Gynecology | Admitting: Obstetrics and Gynecology

## 2014-05-08 DIAGNOSIS — O10913 Unspecified pre-existing hypertension complicating pregnancy, third trimester: Secondary | ICD-10-CM

## 2014-05-08 DIAGNOSIS — Z3A35 35 weeks gestation of pregnancy: Secondary | ICD-10-CM | POA: Insufficient documentation

## 2014-05-08 DIAGNOSIS — O10013 Pre-existing essential hypertension complicating pregnancy, third trimester: Secondary | ICD-10-CM | POA: Insufficient documentation

## 2014-05-08 DIAGNOSIS — R030 Elevated blood-pressure reading, without diagnosis of hypertension: Secondary | ICD-10-CM | POA: Diagnosis present

## 2014-05-08 DIAGNOSIS — O24313 Unspecified pre-existing diabetes mellitus in pregnancy, third trimester: Secondary | ICD-10-CM

## 2014-05-08 DIAGNOSIS — E119 Type 2 diabetes mellitus without complications: Secondary | ICD-10-CM | POA: Diagnosis not present

## 2014-05-08 DIAGNOSIS — O24113 Pre-existing diabetes mellitus, type 2, in pregnancy, third trimester: Secondary | ICD-10-CM | POA: Diagnosis not present

## 2014-05-08 LAB — URINALYSIS, ROUTINE W REFLEX MICROSCOPIC
Bilirubin Urine: NEGATIVE
GLUCOSE, UA: 250 mg/dL — AB
HGB URINE DIPSTICK: NEGATIVE
Ketones, ur: 15 mg/dL — AB
LEUKOCYTES UA: NEGATIVE
Nitrite: NEGATIVE
PH: 6 (ref 5.0–8.0)
Protein, ur: NEGATIVE mg/dL
Specific Gravity, Urine: 1.025 (ref 1.005–1.030)
Urobilinogen, UA: 0.2 mg/dL (ref 0.0–1.0)

## 2014-05-08 LAB — COMPREHENSIVE METABOLIC PANEL
ALT: 9 U/L (ref 0–35)
AST: 14 U/L (ref 0–37)
Albumin: 3.1 g/dL — ABNORMAL LOW (ref 3.5–5.2)
Alkaline Phosphatase: 121 U/L — ABNORMAL HIGH (ref 39–117)
Anion gap: 7 (ref 5–15)
BUN: 10 mg/dL (ref 6–23)
CALCIUM: 8.7 mg/dL (ref 8.4–10.5)
CO2: 21 mmol/L (ref 19–32)
Chloride: 107 mmol/L (ref 96–112)
Creatinine, Ser: 0.44 mg/dL — ABNORMAL LOW (ref 0.50–1.10)
GFR calc non Af Amer: >90 mL/min (ref >90)
Glucose, Bld: 157 mg/dL — ABNORMAL HIGH (ref 70–99)
Potassium: 3.8 mmol/L (ref 3.5–5.1)
SODIUM: 135 mmol/L (ref 135–145)
Total Bilirubin: <0.1 mg/dL — ABNORMAL LOW (ref 0.3–1.2)
Total Protein: 6.5 g/dL (ref 6.0–8.3)

## 2014-05-08 LAB — CBC
HCT: 36.2 % (ref 36.0–46.0)
Hemoglobin: 12.6 g/dL (ref 12.0–15.0)
MCH: 28.9 pg (ref 26.0–34.0)
MCHC: 34.8 g/dL (ref 30.0–36.0)
MCV: 83 fL (ref 78.0–100.0)
Platelets: 231 10*3/uL (ref 150–400)
RBC: 4.36 MIL/uL (ref 3.87–5.11)
RDW: 14.5 % (ref 11.5–15.5)
WBC: 11.8 10*3/uL — AB (ref 4.0–10.5)

## 2014-05-08 LAB — PROTEIN / CREATININE RATIO, URINE
Creatinine, Urine: 79 mg/dL
Total Protein, Urine: <6 mg/dL

## 2014-05-08 MED ORDER — LABETALOL HCL 5 MG/ML IV SOLN: 20.0000 mg | INTRAVENOUS | Status: DC | PRN

## 2014-05-08 MED ORDER — HYDRALAZINE HCL 20 MG/ML IJ SOLN: 10.0000 mg | Freq: Once | INTRAMUSCULAR | Status: DC | PRN

## 2014-05-08 NOTE — MAU Note (Signed)
[email or web address] teaching sheet given and reviewed with patient. Patient verbalizes understanding.

## 2014-05-08 NOTE — MAU Provider Note (Signed)
Chief Complaint:  Hypertension   First Provider Initiated Contact with Patient 05/08/14 1720      HPI: [name] is a 34 y.o. G2P1001 at 4759w4d who presents to maternity admissions sent from the office for elevated BP and RUQ abdominal pain.  She has pregnancy history [facility] and preexisting DM.  She reports mild h/a off and on x 2-3 weeks, denies visual changes.  Her RUQ pain has been intermittent x 1 week described as sharp and pressure in her upper right abdomen that lasts for a few hours per day, then usually resolves.  She reports good fetal movement, denies LOF, vaginal bleeding, vaginal itching/burning, urinary symptoms, h/a, dizziness, n/v, or fever/chills.    Hypertension This is a chronic problem. The current episode started more than 1 month ago. The problem has been waxing and waning since onset. The problem is controlled. Associated symptoms include headaches. Pertinent negatives include no blurred vision, chest pain or shortness of breath. There are no associated agents to hypertension. There are no compliance problems.   Abdominal Pain This is a recurrent problem. The current episode started 1 to 4 weeks ago. The onset quality is gradual. The problem occurs intermittently. The most recent episode lasted 1 week. The problem has been waxing and waning. The pain is located in the RUQ. The pain is moderate. The quality of the pain is sharp and a sensation of fullness. The abdominal pain does not radiate. Associated symptoms include headaches. Pertinent negatives include no constipation, diarrhea, fever, nausea or vomiting. The pain is aggravated by certain positions. She has tried nothing for the symptoms.    Past Medical History: Past Medical History  Diagnosis Date  . GERD (gastroesophageal reflux disease)   . Fatty liver disease, nonalcoholic   . Diabetes mellitus without complication   . Obesity   . Hypertension   . Anxiety   . Depression   . Pelvis fracture    . Back fracture   . H/O cesarean section complicating pregnancy 04/18/2014    Past obstetric history: OB History  Gravida Para Term Preterm AB SAB TAB Ectopic Multiple Living  2 1 1       1     # Outcome Date GA Lbr Len/2nd Weight Sex Delivery Anes PTL Lv  2 Current           1 Term               Past Surgical History: Past Surgical History  Procedure Laterality Date  . Cesarean section    . Tonsillectomy    . Chest tube insertion    . Tracheostomy      Family History: Family History  Problem Relation Age of Onset  . Diabetes Maternal Grandmother   . Diabetes Paternal Grandmother     Social History: History  Substance Use Topics  . Smoking status: Current Every Day Smoker -- 1.00 packs/day for 12 years    Types: Cigarettes  . Smokeless tobacco: Never Used  . Alcohol Use: No    Allergies: No Known Allergies  Meds:  No prescriptions prior to admission    Review of Systems  Constitutional: Negative for fever.  Eyes: Negative for blurred vision.  Respiratory: Negative for shortness of breath.   Cardiovascular: Negative for chest pain.  Gastrointestinal: Positive for abdominal pain. Negative for nausea, vomiting, diarrhea and constipation.  Neurological: Positive for headaches.    Physical Exam  Blood pressure 128/61, pulse 91, temperature 97.8 F (36.6 C), temperature source  Oral, resp. rate 20, height  (1.549 m), weight 94.348 kg (208 lb).  Patient Vitals for the past 24 hrs:  BP Temp Temp src Pulse Resp Height Weight  05/08/14 1752 128/61 mmHg - - 91 20 - -  05/08/14 1737 142/81 mmHg - - 89 - - -  05/08/14 1722 145/92 mmHg - - 95 - - -  05/08/14 1707 161/94 mmHg - - 92 - - -  05/08/14 1652 147/74 mmHg - - 108 - - -  05/08/14 1637 148/93 mmHg - - 101 - - -  05/08/14 1625 153/95 mmHg - - 103 - - -  05/08/14 1601 155/80 mmHg 97.8 F (36.6 C) Oral 105 18  (1.549 m) 94.348 kg (208 lb)   GENERAL: Well-developed, well-nourished female in no acute  distress.  EYES: normal sclera/conjunctiva; no lid-lag HENT: Atraumatic, normocephalic HEART: normal rate, heart sounds, regular rhythm RESP: normal effort, lung sounds clear and equal bilaterally ABDOMEN: Soft, non-tender MUSCULOSKELETAL: Normal ROM EXTREMITIES: Nontender, no edema NEURO/PSYCH: Alert and oriented, appropriate affect    FHT:  Baseline 145 , moderate variability, accelerations present, no decelerations Contractions: None on toco or to palpation   Labs: Results for orders placed or performed during the hospital encounter of 05/08/14 (from the past 24 hour(s))  Protein / creatinine ratio, urine     Status: None   Collection Time: 05/08/14  4:05 PM  Result Value Ref Range   Creatinine, Urine 79.00 mg/dL   Total Protein, Urine <6 mg/dL   Protein Creatinine Ratio        0.00 - 0.15  Urinalysis, Routine w reflex microscopic     Status: Abnormal   Collection Time: 05/08/14  4:05 PM  Result Value Ref Range   Color, Urine YELLOW YELLOW   APPearance CLEAR CLEAR   Specific Gravity, Urine 1.025 1.005 - 1.030   pH 6.0 5.0 - 8.0   Glucose, UA 250 (A) NEGATIVE mg/dL   Hgb urine dipstick NEGATIVE NEGATIVE   Bilirubin Urine NEGATIVE NEGATIVE   Ketones, ur 15 (A) NEGATIVE mg/dL   Protein, ur NEGATIVE NEGATIVE mg/dL   Urobilinogen, UA 0.2 0.0 - 1.0 mg/dL   Nitrite NEGATIVE NEGATIVE   Leukocytes, UA NEGATIVE NEGATIVE  Comprehensive metabolic panel     Status: Abnormal   Collection Time: 05/08/14  4:19 PM  Result Value Ref Range   Sodium 135 135 - 145 mmol/L   Potassium 3.8 3.5 - 5.1 mmol/L   Chloride 107 96 - 112 mmol/L   CO2 21 19 - 32 mmol/L   Glucose, Bld 157 (H) 70 - 99 mg/dL   BUN 10 6 - 23 mg/dL   Creatinine, Ser 9.60 (L) 0.50 - 1.10 mg/dL   Calcium 8.7 8.4 - 45.4 mg/dL   Total Protein 6.5 6.0 - 8.3 g/dL   Albumin 3.1 (L) 3.5 - 5.2 g/dL   AST 14 0 - 37 U/L   ALT 9 0 - 35 U/L   Alkaline Phosphatase 121 (H) 39 - 117 U/L   Total Bilirubin <0.1 (L) 0.3 - 1.2 mg/dL    GFR calc non Af Amer >90 >90 mL/min   GFR calc Af Amer >90 >90 mL/min   Anion gap 7 5 - 15  CBC     Status: Abnormal   Collection Time: 05/08/14  4:19 PM  Result Value Ref Range   WBC 11.8 (H) 4.0 - 10.5 K/uL   RBC 4.36 3.87 - 5.11 MIL/uL   Hemoglobin 12.6 12.0 - 15.0  g/dL   HCT 16.1 09.6 - 04.5 %   MCV 83.0 78.0 - 100.0 fL   MCH 28.9 26.0 - 34.0 pg   MCHC 34.8 30.0 - 36.0 g/dL   RDW 40.9 81.1 - 91.4 %   Platelets 231 150 - 400 K/uL   Protein/Creatinine ratio too low to detect per lab results  ED Course Discussed POC with pt.  Ordered preeclampsia labs (CBC, CMP, Protein/Creatinine rato, u/a).  Reviewed lab results.    Assessment: 1. Chronic hypertension in pregnancy, third trimester   2. Preexisting diabetes complicating pregnancy in third trimester, antepartum     Plan: Consult Dr Hinton Rao. Reviewed history, assessment, and labs. Discharge home Preeclampsia precautions given Pt to continue twice weekly testing in office and complete 24 hour urine before next visit Thursday Return to MAU as needed for emergencies   Follow-up Information    Follow up with Sherian Rein, MD.   Specialty:  Obstetrics and Gynecology   Why:  As scheduled on Thursday. Complete your 24 hour urine collection to drop off at your appointment.   Contact information:   [address] [place] [address] [phone]      Sharen Counter Certified Nurse-Midwife 05/08/2014 8:41 PM

## 2014-05-08 NOTE — Discharge Instructions (Signed)

## 2014-05-08 NOTE — MAU Note (Signed)
Sent from office for htn eval.  Dizzy, spots in vision, headaches, RUQ abd pain Contractions periodically, irregular.  Denies bright red vaginal bleeding.   Decreased fetal movement Denies SROM/LOF Type 2 diabetes

## 2014-05-28 ENCOUNTER — Inpatient Hospital Stay (HOSPITAL_COMMUNITY)
Admission: AD | Admit: 2014-05-28 | Discharge: 2014-05-28 | Disposition: A | Discharge status: Home / Self Care | Payer: Medicaid Other | Source: Ambulatory Visit | Attending: Obstetrics and Gynecology | Admitting: Obstetrics and Gynecology

## 2014-05-28 ENCOUNTER — Encounter (HOSPITAL_COMMUNITY): Payer: Self-pay | Admitting: *Deleted

## 2014-05-28 DIAGNOSIS — O10013 Pre-existing essential hypertension complicating pregnancy, third trimester: Secondary | ICD-10-CM | POA: Insufficient documentation

## 2014-05-28 DIAGNOSIS — O10912 Unspecified pre-existing hypertension complicating pregnancy, second trimester: Secondary | ICD-10-CM | POA: Diagnosis not present

## 2014-05-28 DIAGNOSIS — F17210 Nicotine dependence, cigarettes, uncomplicated: Secondary | ICD-10-CM | POA: Insufficient documentation

## 2014-05-28 DIAGNOSIS — Z3A38 38 weeks gestation of pregnancy: Secondary | ICD-10-CM | POA: Insufficient documentation

## 2014-05-28 DIAGNOSIS — O09523 Supervision of elderly multigravida, third trimester: Secondary | ICD-10-CM | POA: Insufficient documentation

## 2014-05-28 DIAGNOSIS — O99333 Smoking (tobacco) complicating pregnancy, third trimester: Secondary | ICD-10-CM | POA: Diagnosis not present

## 2014-05-28 DIAGNOSIS — O10919 Unspecified pre-existing hypertension complicating pregnancy, unspecified trimester: Secondary | ICD-10-CM

## 2014-05-28 LAB — COMPREHENSIVE METABOLIC PANEL
ALBUMIN: 3 g/dL — AB (ref 3.5–5.0)
ALT: 9 U/L — AB (ref 14–54)
AST: 11 U/L — ABNORMAL LOW (ref 15–41)
Alkaline Phosphatase: 142 U/L — ABNORMAL HIGH (ref 38–126)
Anion gap: 7 (ref 5–15)
BUN: 8 mg/dL (ref 6–20)
CHLORIDE: 104 mmol/L (ref 101–111)
CO2: 24 mmol/L (ref 22–32)
Calcium: 8.5 mg/dL — ABNORMAL LOW (ref 8.9–10.3)
Creatinine, Ser: 0.51 mg/dL (ref 0.44–1.00)
GFR calc non Af Amer: >60 mL/min (ref >60)
Glucose, Bld: 86 mg/dL (ref 65–99)
POTASSIUM: 4.5 mmol/L (ref 3.5–5.1)
SODIUM: 135 mmol/L (ref 135–145)
Total Bilirubin: 0.3 mg/dL (ref 0.3–1.2)
Total Protein: 6.5 g/dL (ref 6.5–8.1)

## 2014-05-28 LAB — PROTEIN / CREATININE RATIO, URINE
Creatinine, Urine: 56 mg/dL
Total Protein, Urine: <6 mg/dL

## 2014-05-28 LAB — URINALYSIS, ROUTINE W REFLEX MICROSCOPIC
Bilirubin Urine: NEGATIVE
Glucose, UA: NEGATIVE mg/dL
Hgb urine dipstick: NEGATIVE
Ketones, ur: NEGATIVE mg/dL
LEUKOCYTES UA: NEGATIVE
Nitrite: NEGATIVE
PROTEIN: NEGATIVE mg/dL
SPECIFIC GRAVITY, URINE: 1.015 (ref 1.005–1.030)
Urobilinogen, UA: 0.2 mg/dL (ref 0.0–1.0)
pH: 6 (ref 5.0–8.0)

## 2014-05-28 LAB — LACTATE DEHYDROGENASE: LDH: 109 U/L (ref 98–192)

## 2014-05-28 LAB — CBC
HCT: 37.1 % (ref 36.0–46.0)
Hemoglobin: 12.7 g/dL (ref 12.0–15.0)
MCH: 28.8 pg (ref 26.0–34.0)
MCHC: 34.2 g/dL (ref 30.0–36.0)
MCV: 84.1 fL (ref 78.0–100.0)
Platelets: 208 10*3/uL (ref 150–400)
RBC: 4.41 MIL/uL (ref 3.87–5.11)
RDW: 14.7 % (ref 11.5–15.5)
WBC: 9.6 10*3/uL (ref 4.0–10.5)

## 2014-05-28 LAB — URIC ACID: Uric Acid, Serum: 3.5 mg/dL (ref 2.3–6.6)

## 2014-05-28 NOTE — MAU Note (Signed)
B/p elevated at office 160/110. Has slight headache no visual changes at this time but has been having them

## 2014-05-28 NOTE — Progress Notes (Signed)
For repeat C/S on Friday

## 2014-05-28 NOTE — MAU Provider Note (Signed)
History     CSN: [id number]  Arrival date and time: 05/28/14 0315   None     Chief Complaint  Patient presents with  . Hypertension   HPI Comments: [name] is a 34 y.o. Female G2P1001 at 47w3dwho presents to MAU with hypertension; she was sent from her Dr.'s office. + fetal movements, denies vaginal bleeding or leaking of fluid.  She is scheduled for a repeat cesarean section on Friday.     Hypertension This is a chronic problem. The current episode started more than 1 year ago. The problem is unchanged. The problem is controlled. Pertinent negatives include no blurred vision or headaches. There are no associated agents to hypertension. Risk factors for coronary artery disease include diabetes mellitus, family history, obesity and smoking/tobacco exposure. Past treatments include ACE inhibitors. The current treatment provides significant improvement.      OB History    Gravida Para Term Preterm AB TAB SAB Ectopic Multiple Living   _0 Past Medical History  Diagnosis Date  . GERD (gastroesophageal reflux disease)   . Fatty liver disease, nonalcoholic   . Diabetes mellitus without complication   . Obesity   . Hypertension   . Anxiety   . Depression   . Pelvis fracture   . Back fracture   . H/O cesarean section complicating pregnancy 49/04/5857   Past Surgical History  Procedure Laterality Date  . Cesarean section    . Tonsillectomy    . Chest tube insertion    . Tracheostomy      Family History  Problem Relation Age of Onset  . Diabetes Maternal Grandmother   . Diabetes Paternal Grandmother     History  Substance Use Topics  . Smoking status: Current Every Day Smoker -- 1.00 packs/day for 12 years    Types: Cigarettes  . Smokeless tobacco: Never Used  . Alcohol Use: No    Allergies: No Known Allergies  Prescriptions prior to admission  Medication Sig Dispense Refill Last Dose  . insulin NPH Human (HUMULIN N,NOVOLIN N)  100 UNIT/ML injection Inject 12-18 Units into the skin 2 (two) times daily before a meal. Pt takes 18 units in the AM and 12 units in the PM.   05/28/2014 at Unknown time  . insulin regular (NOVOLIN R,HUMULIN R) 100 units/mL injection Inject 10 Units into the skin 2 (two) times daily before a meal. Pt takes 10 units in the AM and 12 units in the PM.   05/28/2014 at Unknown time  . Prenatal Vit-Fe Fumarate-FA (BL PRENATAL VITAMINS PO) Take 1 tablet by mouth daily.    05/28/2014 at Unknown time  . sertraline (ZOLOFT) 50 MG tablet Take 50 mg by mouth daily.   05/28/2014 at Unknown time    Results for orders placed or performed during the hospital encounter of 05/28/14 (from the past 48 hour(s))  Urinalysis, Routine w reflex microscopic     Status: None   Collection Time: 05/28/14 10:00 AM  Result Value Ref Range   Color, Urine YELLOW YELLOW   APPearance CLEAR CLEAR   Specific Gravity, Urine 1.015 1.005 - 1.030   pH 6.0 5.0 - 8.0   Glucose, UA NEGATIVE NEGATIVE mg/dL   Hgb urine dipstick NEGATIVE NEGATIVE   Bilirubin Urine NEGATIVE NEGATIVE   Ketones, ur NEGATIVE NEGATIVE mg/dL   Protein, ur NEGATIVE NEGATIVE mg/dL   Urobilinogen, UA 0.2 0.0 - 1.0 mg/dL  Nitrite NEGATIVE NEGATIVE   Leukocytes, UA NEGATIVE NEGATIVE    Comment: MICROSCOPIC NOT DONE ON URINES WITH NEGATIVE PROTEIN, BLOOD, LEUKOCYTES, NITRITE, OR GLUCOSE <1000 mg/dL.  Protein / creatinine ratio, urine     Status: None   Collection Time: 05/28/14 10:00 AM  Result Value Ref Range   Creatinine, Urine 56.00 mg/dL   Total Protein, Urine <6 mg/dL    Comment: NO NORMAL RANGE ESTABLISHED FOR THIS TEST REPEATED TO VERIFY    Protein Creatinine Ratio        0.00 - 0.15 mg/mg[Cre]    Comment: RESULT BELOW REPORTABLE RANGE, UNABLE TO CALCULATE.   CBC     Status: None   Collection Time: 05/28/14 11:26 AM  Result Value Ref Range   WBC 9.6 4.0 - 10.5 K/uL   RBC 4.41 3.87 - 5.11 MIL/uL   Hemoglobin 12.7 12.0 - 15.0 g/dL   HCT 37.1 36.0  - 46.0 %   MCV 84.1 78.0 - 100.0 fL   MCH 28.8 26.0 - 34.0 pg   MCHC 34.2 30.0 - 36.0 g/dL   RDW 14.7 11.5 - 15.5 %   Platelets 208 150 - 400 K/uL  Comprehensive metabolic panel     Status: Abnormal   Collection Time: 05/28/14 11:26 AM  Result Value Ref Range   Sodium 135 135 - 145 mmol/L   Potassium 4.5 3.5 - 5.1 mmol/L   Chloride 104 101 - 111 mmol/L   CO2 24 22 - 32 mmol/L   Glucose, Bld 86 65 - 99 mg/dL   BUN 8 6 - 20 mg/dL   Creatinine, Ser 0.51 0.44 - 1.00 mg/dL   Calcium 8.5 (L) 8.9 - 10.3 mg/dL   Total Protein 6.5 6.5 - 8.1 g/dL   Albumin 3.0 (L) 3.5 - 5.0 g/dL   AST 11 (L) 15 - 41 U/L   ALT 9 (L) 14 - 54 U/L   Alkaline Phosphatase 142 (H) 38 - 126 U/L   Total Bilirubin 0.3 0.3 - 1.2 mg/dL   GFR calc non Af Amer >60 >60 mL/min   GFR calc Af Amer >60 >60 mL/min    Comment: (NOTE) The eGFR has been calculated using the CKD EPI equation. This calculation has not been validated in all clinical situations. eGFR's persistently <60 mL/min signify possible Chronic Kidney Disease.    Anion gap 7 5 - 15  Uric acid     Status: None   Collection Time: 05/28/14 11:26 AM  Result Value Ref Range   Uric Acid, Serum 3.5 2.3 - 6.6 mg/dL  Lactate dehydrogenase     Status: None   Collection Time: 05/28/14 11:26 AM  Result Value Ref Range   LDH 109 98 - 192 U/L     Review of Systems  Eyes: Negative for blurred vision.  Cardiovascular: Positive for leg swelling.  Gastrointestinal: Negative for abdominal pain.  Neurological: Negative for headaches.   Physical Exam   Blood pressure 130/72, pulse 83, temperature 97.8 F (36.6 C), temperature source Oral, resp. rate 16, height _0  (1.549 m), weight 95.255 kg (210 lb).  Physical Exam  Constitutional: She is oriented to person, place, and time. She appears well-developed and well-nourished. No distress.  HENT:  Head: Normocephalic.  Eyes: Pupils are equal, round, and reactive to light.  Neck: Neck supple.  Respiratory:  Effort normal.  GI: Soft. There is no tenderness.  Musculoskeletal: Normal range of motion. She exhibits no edema.  Neurological: She is alert and oriented to person, place,  and time. She has normal reflexes.  Negative clonus   Skin: Skin is warm. She is not diaphoretic.  Psychiatric: Her behavior is normal.    Fetal Tracing: Baseline: 130 bpm Variability: moderate  Accelerations: 15x15 Decelerations: none Toco: none  MAU Course  Procedures  None  MDM PIH labs   Assessment and Plan   A:  1. Chronic hypertension during pregnancy, antepartum    P:  Discharge home in stable condition Follow up with Dr. Ulanda Edison as scheduled Preeclampsia precautions Kick counts Return to MAU if symptoms worsen     Lezlie Lye, NP 05/28/2014 11:12 AM

## 2014-05-28 NOTE — Discharge Instructions (Signed)
Fetal Biophysical Profile °This is a test that measures five different variables of the fetus: Heart rate, breathing movement, total movement of the baby, fetal muscle tone, the amount of amniotic fluid, and the heart rate activity of the fetus. The five variables are measured individually and contribute either a 2 or a 0 to the overall scoring of the test. The measurements are as follows: °· Fetal heart rate activity. This is measured and scored in the same way as a non-stress test. The fetal heart rate is considered reactive when there are movement-associated fetal heart rate increases of at least 15 beats per minute above baseline, and 15 seconds in duration over a 20-minute period. A score of 2 is given for reactivity, and a score of 0 indicates that the fetal heart rate is non-reactive. °· Fetal breathing movements. This is scored based on fetal breathing movements and indicate fetal well-being. Their absence may indicate a low oxygen level for the fetus. Fetal breathing increases in frequency and uniformity after the 36th week of pregnancy. To earn a score of 2, the fetus must have at least one episode of fetal breathing lasting at least 60 seconds within a 30-minute observation. Absence of this breathing is scored a 0 on the BPP. °· Fetal body movements. Fetal activity is a reflection of brain integrity and function. The presence of at least three episodes of fetal movements within a 30-minute period is given a score of 2. A score of 0 is given with two or less movements in this time period. Fetal activity is highest 1 to 3 hours after the mother has eaten a meal. °· Fetal tone. In the uterus, the fetus is normally in a position of flexion. This means the head is bent down towards the knees. The fetus also stretches, rolls, and moves in the uterus. The arms, legs, trunk, and head may be flexed and extended. A score of 2 is earned when there is at least one episode of active extension with return flexion. A  score of 0 is given for slow extension with a return to only partial flexion. Fetal movement not followed by return to flexion, limbs or spine in extension, and an open fetal hand score 0. °· Amniotic fluid volume. Amniotic fluid volume has been demonstrated to be a good method of predicting fetal distress. Too little amniotic fluid has been associated with fetal abnormalities, slow uterine growth, and over due pregnancy. A score of 2 is given for this when there is at least one pocket of amniotic fluid that measures 1 cm in a specific area. A score of 0 indicates either that fluid is absent in most areas of the uterine cavity or that the largest pocket of fluid measures less than 1 cm. °PREPARATION FOR TEST °No preparation or fasting is necessary. °NORMAL FINDINGS °· A score of 8-10 points (if amniotic fluid volume is adequate). °· Possible critical values: Less than 4 may necessitate immediate delivery of fetus. °Ranges for normal findings may vary among different laboratories and hospitals. You should always check with your doctor after having lab work or other tests done to discuss the meaning of your test results and whether your values are considered within normal limits. °MEANING OF TEST  °Your caregiver will go over the test results with you and discuss the importance and meaning of your results, as well as treatment options and the need for additional tests if necessary. °OBTAINING THE TEST RESULTS  °It is your responsibility to obtain your test   results. Ask the lab or department performing the test when and how you will get your results. °Document Released: 05/01/2004 Document Revised: 03/23/2011 Document Reviewed: 12/09/2007 °[employer]® Patient Information ©2015 [employer]. This information is not intended to replace advice given to you by your health care provider. Make sure you discuss any questions you have with your health care provider. ° °

## 2014-05-30 ENCOUNTER — Encounter (HOSPITAL_COMMUNITY): Payer: Self-pay

## 2014-05-31 ENCOUNTER — Encounter (HOSPITAL_COMMUNITY): Payer: Self-pay

## 2014-05-31 ENCOUNTER — Encounter (HOSPITAL_COMMUNITY)
Admission: RE | Admit: 2014-05-31 | Discharge: 2014-05-31 | Disposition: A | Discharge status: Home / Self Care | Payer: Medicaid Other | Source: Ambulatory Visit | Attending: Obstetrics and Gynecology | Admitting: Obstetrics and Gynecology

## 2014-05-31 VITALS — BP 122/91 | HR 87 | Temp 97.5°F | Resp 18 | Ht 61.0 in | Wt 210.4 lb

## 2014-05-31 DIAGNOSIS — O34219 Maternal care for unspecified type scar from previous cesarean delivery: Secondary | ICD-10-CM

## 2014-05-31 HISTORY — DX: Family history of other specified conditions: Z84.89

## 2014-05-31 HISTORY — DX: Myoneural disorder, unspecified: G70.9

## 2014-05-31 LAB — BASIC METABOLIC PANEL
ANION GAP: 7 (ref 5–15)
BUN: 8 mg/dL (ref 6–20)
CO2: 23 mmol/L (ref 22–32)
Calcium: 8.5 mg/dL — ABNORMAL LOW (ref 8.9–10.3)
Chloride: 104 mmol/L (ref 101–111)
Creatinine, Ser: 0.48 mg/dL (ref 0.44–1.00)
GFR calc Af Amer: >60 mL/min (ref >60)
GFR calc non Af Amer: >60 mL/min (ref >60)
GLUCOSE: 100 mg/dL — AB (ref 65–99)
Potassium: 4.2 mmol/L (ref 3.5–5.1)
SODIUM: 134 mmol/L — AB (ref 135–145)

## 2014-05-31 LAB — CBC
HCT: 37.3 % (ref 36.0–46.0)
Hemoglobin: 13 g/dL (ref 12.0–15.0)
MCH: 28.9 pg (ref 26.0–34.0)
MCHC: 34.9 g/dL (ref 30.0–36.0)
MCV: 82.9 fL (ref 78.0–100.0)
PLATELETS: 207 10*3/uL (ref 150–400)
RBC: 4.5 MIL/uL (ref 3.87–5.11)
RDW: 14.6 % (ref 11.5–15.5)
WBC: 10.1 10*3/uL (ref 4.0–10.5)

## 2014-05-31 LAB — ABO/RH: ABO/RH(D): O POS
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
## 2014-05-31 NOTE — Patient Instructions (Addendum)
Your procedure is scheduled on: Jun 01, 2014  [facility] through [facility] Entrance of [facility] at: 0600 am Pick up the phone at the desk and dial [phone].  Call this number if you have problems the morning of surgery: [phone].  Remember: Do NOT eat food: after midnight tonight  Do NOT drink clear liquids after : midnight tonight  Take these medicines the morning of surgery with a SIP OF WATER: none   Do NOT wear jewelry (body piercing), metal hair clips/bobby pins,  or nail polish. Do NOT wear lotions, powders, or perfumes.  You may wear deoderant. Do NOT shave for 48 hours prior to surgery. Do NOT bring valuables to the hospital. Contacts, dentures, or bridgework may not be worn into surgery. Leave suitcase in car.  After surgery it may be brought to your room.  For patients admitted to the hospital, checkout time is 11:00 AM the day of discharge.

## 2014-06-01 ENCOUNTER — Encounter (HOSPITAL_COMMUNITY): Payer: Self-pay | Admitting: Emergency Medicine

## 2014-06-01 ENCOUNTER — Inpatient Hospital Stay (HOSPITAL_COMMUNITY): Payer: Medicaid Other | Admitting: Anesthesiology

## 2014-06-01 ENCOUNTER — Inpatient Hospital Stay (HOSPITAL_COMMUNITY)
Admission: RE | Admit: 2014-06-01 | Discharge: 2014-06-03 | DRG: 765 | Disposition: A | Discharge status: Home / Self Care | Payer: Medicaid Other | Source: Ambulatory Visit | Attending: Obstetrics and Gynecology | Admitting: Obstetrics and Gynecology

## 2014-06-01 ENCOUNTER — Encounter (HOSPITAL_COMMUNITY)
Admission: RE | Disposition: A | Discharge status: Home / Self Care | Payer: Self-pay | Source: Ambulatory Visit | Attending: Obstetrics and Gynecology

## 2014-06-01 DIAGNOSIS — O99214 Obesity complicating childbirth: Secondary | ICD-10-CM | POA: Diagnosis present

## 2014-06-01 DIAGNOSIS — O2412 Pre-existing diabetes mellitus, type 2, in childbirth: Secondary | ICD-10-CM | POA: Diagnosis present

## 2014-06-01 DIAGNOSIS — O99344 Other mental disorders complicating childbirth: Secondary | ICD-10-CM | POA: Diagnosis present

## 2014-06-01 DIAGNOSIS — Z302 Encounter for sterilization: Secondary | ICD-10-CM

## 2014-06-01 DIAGNOSIS — Z833 Family history of diabetes mellitus: Secondary | ICD-10-CM

## 2014-06-01 DIAGNOSIS — Z6839 Body mass index (BMI) 39.0-39.9, adult: Secondary | ICD-10-CM | POA: Diagnosis not present

## 2014-06-01 DIAGNOSIS — K219 Gastro-esophageal reflux disease without esophagitis: Secondary | ICD-10-CM | POA: Diagnosis present

## 2014-06-01 DIAGNOSIS — O09523 Supervision of elderly multigravida, third trimester: Secondary | ICD-10-CM | POA: Diagnosis not present

## 2014-06-01 DIAGNOSIS — Z8249 Family history of ischemic heart disease and other diseases of the circulatory system: Secondary | ICD-10-CM | POA: Diagnosis not present

## 2014-06-01 DIAGNOSIS — Z794 Long term (current) use of insulin: Secondary | ICD-10-CM

## 2014-06-01 DIAGNOSIS — O9962 Diseases of the digestive system complicating childbirth: Secondary | ICD-10-CM | POA: Diagnosis present

## 2014-06-01 DIAGNOSIS — Z3A39 39 weeks gestation of pregnancy: Secondary | ICD-10-CM | POA: Diagnosis present

## 2014-06-01 DIAGNOSIS — O3421 Maternal care for scar from previous cesarean delivery: Principal | ICD-10-CM | POA: Diagnosis present

## 2014-06-01 DIAGNOSIS — Z98891 History of uterine scar from previous surgery: Secondary | ICD-10-CM

## 2014-06-01 DIAGNOSIS — Z9851 Tubal ligation status: Secondary | ICD-10-CM

## 2014-06-01 DIAGNOSIS — F418 Other specified anxiety disorders: Secondary | ICD-10-CM | POA: Diagnosis present

## 2014-06-01 DIAGNOSIS — O1092 Unspecified pre-existing hypertension complicating childbirth: Secondary | ICD-10-CM | POA: Diagnosis present

## 2014-06-01 DIAGNOSIS — E119 Type 2 diabetes mellitus without complications: Secondary | ICD-10-CM | POA: Diagnosis present

## 2014-06-01 DIAGNOSIS — O99824 Streptococcus B carrier state complicating childbirth: Secondary | ICD-10-CM | POA: Diagnosis present

## 2014-06-01 DIAGNOSIS — O99334 Smoking (tobacco) complicating childbirth: Secondary | ICD-10-CM | POA: Diagnosis present

## 2014-06-01 DIAGNOSIS — F17210 Nicotine dependence, cigarettes, uncomplicated: Secondary | ICD-10-CM | POA: Diagnosis present

## 2014-06-01 DIAGNOSIS — O34219 Maternal care for unspecified type scar from previous cesarean delivery: Secondary | ICD-10-CM | POA: Diagnosis present

## 2014-06-01 HISTORY — DX: Tubal ligation status: Z98.51

## 2014-06-01 HISTORY — DX: History of uterine scar from previous surgery: Z98.891

## 2014-06-01 LAB — GLUCOSE, CAPILLARY
GLUCOSE-CAPILLARY: 125 mg/dL — AB (ref 65–99)
Glucose-Capillary: 102 mg/dL — ABNORMAL HIGH (ref 65–99)
Glucose-Capillary: 83 mg/dL (ref 65–99)
Glucose-Capillary: 129 mg/dL — ABNORMAL HIGH (ref 65–99)

## 2014-06-01 LAB — RPR: RPR Ser Ql: NONREACTIVE

## 2014-06-01 SURGERY — Surgical Case
Anesthesia: Spinal | Laterality: Bilateral

## 2014-06-01 MED ORDER — LACTATED RINGERS IV SOLN
INTRAVENOUS | Status: DC
  Administered 2014-06-01: 16:00:00 via INTRAVENOUS

## 2014-06-01 MED ORDER — CEFAZOLIN SODIUM-DEXTROSE 2-3 GM-% IV SOLR
2.0000 g | INTRAVENOUS | Status: AC
  Administered 2014-06-01: 2 g via INTRAVENOUS

## 2014-06-01 MED ORDER — DIPHENHYDRAMINE HCL 25 MG PO CAPS: 25.0000 mg | ORAL_CAPSULE | Freq: Four times a day (QID) | ORAL | Status: DC | PRN

## 2014-06-01 MED ORDER — OXYTOCIN 10 UNIT/ML IJ SOLN
INTRAMUSCULAR | Status: AC
  Filled 2014-06-01: qty 4

## 2014-06-01 MED ORDER — ACETAMINOPHEN 325 MG PO TABS: 650.0000 mg | ORAL_TABLET | ORAL | Status: DC | PRN

## 2014-06-01 MED ORDER — CHLOROPROCAINE HCL 3 % IJ SOLN: INTRAMUSCULAR | Status: DC | PRN

## 2014-06-01 MED ORDER — BUPIVACAINE IN DEXTROSE 0.75-8.25 % IT SOLN
INTRATHECAL | Status: DC | PRN
  Administered 2014-06-01: 1.6 mL via INTRATHECAL

## 2014-06-01 MED ORDER — SCOPOLAMINE 1 MG/3DAYS TD PT72
1.0000 | MEDICATED_PATCH | TRANSDERMAL | Status: DC
  Administered 2014-06-01: 1.5 mg via TRANSDERMAL

## 2014-06-01 MED ORDER — PHENYLEPHRINE HCL 10 MG/ML IJ SOLN
INTRAMUSCULAR | Status: DC | PRN
  Administered 2014-06-01: 80 ug via INTRAVENOUS
  Administered 2014-06-01: 40 ug via INTRAVENOUS

## 2014-06-01 MED ORDER — NALBUPHINE HCL 10 MG/ML IJ SOLN: 5.0000 mg | INTRAMUSCULAR | Status: DC | PRN

## 2014-06-01 MED ORDER — FENTANYL CITRATE (PF) 100 MCG/2ML IJ SOLN: 25.0000 ug | INTRAMUSCULAR | Status: DC | PRN

## 2014-06-01 MED ORDER — DIPHENHYDRAMINE HCL 25 MG PO CAPS: 25.0000 mg | ORAL_CAPSULE | ORAL | Status: DC | PRN

## 2014-06-01 MED ORDER — LANOLIN HYDROUS EX OINT: 1.0000 "application " | TOPICAL_OINTMENT | CUTANEOUS | Status: DC | PRN

## 2014-06-01 MED ORDER — PHENYLEPHRINE 40 MCG/ML (10ML) SYRINGE FOR IV PUSH (FOR BLOOD PRESSURE SUPPORT)
PREFILLED_SYRINGE | INTRAVENOUS | Status: AC
Start: 2014-06-01 — End: 2014-06-01
  Filled 2014-06-01: qty 10

## 2014-06-01 MED ORDER — METFORMIN HCL 500 MG PO TABS
500.0000 mg | ORAL_TABLET | Freq: Two times a day (BID) | ORAL | Status: DC
  Administered 2014-06-01 – 2014-06-03 (×4): 500 mg via ORAL
  Filled 2014-06-01 (×6): qty 1

## 2014-06-01 MED ORDER — FENTANYL CITRATE (PF) 100 MCG/2ML IJ SOLN
INTRAMUSCULAR | Status: DC | PRN
  Administered 2014-06-01: 20 ug via INTRATHECAL

## 2014-06-01 MED ORDER — SERTRALINE HCL 50 MG PO TABS
50.0000 mg | ORAL_TABLET | Freq: Every day | ORAL | Status: DC
  Administered 2014-06-01 – 2014-06-03 (×3): 50 mg via ORAL
  Filled 2014-06-01 (×4): qty 1

## 2014-06-01 MED ORDER — ONDANSETRON HCL 4 MG/2ML IJ SOLN
INTRAMUSCULAR | Status: AC
  Filled 2014-06-01: qty 2

## 2014-06-01 MED ORDER — LACTATED RINGERS IV SOLN
INTRAVENOUS | Status: DC
  Administered 2014-06-01: 08:00:00 via INTRAVENOUS

## 2014-06-01 MED ORDER — KETOROLAC TROMETHAMINE 30 MG/ML IJ SOLN
30.0000 mg | Freq: Four times a day (QID) | INTRAMUSCULAR | Status: AC | PRN
  Administered 2014-06-01: 30 mg via INTRAVENOUS
  Filled 2014-06-01: qty 1

## 2014-06-01 MED ORDER — DIPHENHYDRAMINE HCL 50 MG/ML IJ SOLN: 12.5000 mg | INTRAMUSCULAR | Status: DC | PRN

## 2014-06-01 MED ORDER — OXYCODONE-ACETAMINOPHEN 5-325 MG PO TABS
1.0000 | ORAL_TABLET | ORAL | Status: DC | PRN
  Administered 2014-06-01 – 2014-06-02 (×3): 1 via ORAL
  Filled 2014-06-01 (×3): qty 1

## 2014-06-01 MED ORDER — SIMETHICONE 80 MG PO CHEW
80.0000 mg | CHEWABLE_TABLET | Freq: Three times a day (TID) | ORAL | Status: DC
  Administered 2014-06-01 – 2014-06-03 (×5): 80 mg via ORAL
  Filled 2014-06-01 (×4): qty 1

## 2014-06-01 MED ORDER — PHENYLEPHRINE 8 MG IN D5W 100 ML (0.08MG/ML) PREMIX OPTIME
INJECTION | INTRAVENOUS | Status: DC | PRN
  Administered 2014-06-01: 60 ug/min via INTRAVENOUS

## 2014-06-01 MED ORDER — DIBUCAINE 1 % RE OINT: 1.0000 "application " | TOPICAL_OINTMENT | RECTAL | Status: DC | PRN

## 2014-06-01 MED ORDER — SODIUM CHLORIDE 0.9 % IR SOLN
Status: DC | PRN
  Administered 2014-06-01: 1000 mL

## 2014-06-01 MED ORDER — EPHEDRINE 5 MG/ML INJ
INTRAVENOUS | Status: AC
  Filled 2014-06-01: qty 10

## 2014-06-01 MED ORDER — MENTHOL 3 MG MT LOZG: 1.0000 | LOZENGE | OROMUCOSAL | Status: DC | PRN

## 2014-06-01 MED ORDER — PRENATAL MULTIVITAMIN CH
1.0000 | ORAL_TABLET | Freq: Every day | ORAL | Status: DC
  Administered 2014-06-02 – 2014-06-03 (×2): 1 via ORAL
  Filled 2014-06-01: qty 1

## 2014-06-01 MED ORDER — SCOPOLAMINE 1 MG/3DAYS TD PT72
1.0000 | MEDICATED_PATCH | Freq: Once | TRANSDERMAL | Status: DC
  Filled 2014-06-01: qty 1

## 2014-06-01 MED ORDER — CEFAZOLIN SODIUM-DEXTROSE 2-3 GM-% IV SOLR
INTRAVENOUS | Status: AC
  Filled 2014-06-01: qty 50

## 2014-06-01 MED ORDER — CEFAZOLIN SODIUM-DEXTROSE 2-3 GM-% IV SOLR: 2.0000 g | INTRAVENOUS | Status: DC

## 2014-06-01 MED ORDER — OXYTOCIN 40 UNITS IN LACTATED RINGERS INFUSION - SIMPLE MED: 62.5000 mL/h | INTRAVENOUS | Status: AC

## 2014-06-01 MED ORDER — KETOROLAC TROMETHAMINE 30 MG/ML IJ SOLN: 30.0000 mg | Freq: Four times a day (QID) | INTRAMUSCULAR | Status: AC | PRN

## 2014-06-01 MED ORDER — SIMETHICONE 80 MG PO CHEW: 80.0000 mg | CHEWABLE_TABLET | ORAL | Status: DC | PRN

## 2014-06-01 MED ORDER — NALOXONE HCL 0.4 MG/ML IJ SOLN: 0.4000 mg | INTRAMUSCULAR | Status: DC | PRN

## 2014-06-01 MED ORDER — KETOROLAC TROMETHAMINE 30 MG/ML IJ SOLN
30.0000 mg | Freq: Once | INTRAMUSCULAR | Status: AC
  Administered 2014-06-01: 30 mg via INTRAMUSCULAR

## 2014-06-01 MED ORDER — NALBUPHINE HCL 10 MG/ML IJ SOLN: 5.0000 mg | Freq: Once | INTRAMUSCULAR | Status: AC | PRN

## 2014-06-01 MED ORDER — LACTATED RINGERS IV SOLN
INTRAVENOUS | Status: DC
  Administered 2014-06-01 (×3): via INTRAVENOUS

## 2014-06-01 MED ORDER — SODIUM CHLORIDE 0.9 % IJ SOLN: 3.0000 mL | INTRAMUSCULAR | Status: DC | PRN

## 2014-06-01 MED ORDER — SCOPOLAMINE 1 MG/3DAYS TD PT72
MEDICATED_PATCH | TRANSDERMAL | Status: AC
  Administered 2014-06-01: 1.5 mg via TRANSDERMAL
  Filled 2014-06-01: qty 1

## 2014-06-01 MED ORDER — MEPERIDINE HCL 25 MG/ML IJ SOLN: 6.2500 mg | INTRAMUSCULAR | Status: DC | PRN

## 2014-06-01 MED ORDER — MORPHINE SULFATE (PF) 0.5 MG/ML IJ SOLN
INTRAMUSCULAR | Status: DC | PRN
  Administered 2014-06-01: .2 mg via INTRATHECAL

## 2014-06-01 MED ORDER — SENNOSIDES-DOCUSATE SODIUM 8.6-50 MG PO TABS
2.0000 | ORAL_TABLET | ORAL | Status: DC
  Administered 2014-06-01 – 2014-06-02 (×2): 2 via ORAL
  Filled 2014-06-01 (×2): qty 2

## 2014-06-01 MED ORDER — ONDANSETRON HCL 4 MG/2ML IJ SOLN: 4.0000 mg | Freq: Three times a day (TID) | INTRAMUSCULAR | Status: DC | PRN

## 2014-06-01 MED ORDER — DEXTROSE 5 % IV SOLN
1.0000 ug/kg/h | INTRAVENOUS | Status: DC | PRN
  Filled 2014-06-01: qty 2

## 2014-06-01 MED ORDER — IBUPROFEN 800 MG PO TABS
800.0000 mg | ORAL_TABLET | Freq: Three times a day (TID) | ORAL | Status: DC
  Administered 2014-06-01 – 2014-06-02 (×4): 800 mg via ORAL
  Filled 2014-06-01 (×4): qty 1

## 2014-06-01 MED ORDER — SIMETHICONE 80 MG PO CHEW
80.0000 mg | CHEWABLE_TABLET | ORAL | Status: DC
  Administered 2014-06-01 – 2014-06-02 (×2): 80 mg via ORAL
  Filled 2014-06-01 (×2): qty 1

## 2014-06-01 MED ORDER — MORPHINE SULFATE 0.5 MG/ML IJ SOLN
INTRAMUSCULAR | Status: AC
  Filled 2014-06-01: qty 10

## 2014-06-01 MED ORDER — WITCH HAZEL-GLYCERIN EX PADS: 1.0000 "application " | MEDICATED_PAD | CUTANEOUS | Status: DC | PRN

## 2014-06-01 MED ORDER — KETOROLAC TROMETHAMINE 30 MG/ML IJ SOLN
INTRAMUSCULAR | Status: AC
  Filled 2014-06-01: qty 1

## 2014-06-01 MED ORDER — EPHEDRINE SULFATE 50 MG/ML IJ SOLN
INTRAMUSCULAR | Status: DC | PRN
  Administered 2014-06-01: 5 mg via INTRAVENOUS

## 2014-06-01 MED ORDER — OXYCODONE-ACETAMINOPHEN 5-325 MG PO TABS
2.0000 | ORAL_TABLET | ORAL | Status: DC | PRN
  Administered 2014-06-02 – 2014-06-03 (×3): 2 via ORAL
  Filled 2014-06-01 (×3): qty 2

## 2014-06-01 MED ORDER — OXYTOCIN 10 UNIT/ML IJ SOLN
40.0000 [IU] | INTRAMUSCULAR | Status: DC | PRN
  Administered 2014-06-01: 40 [IU] via INTRAVENOUS

## 2014-06-01 MED ORDER — FENTANYL CITRATE (PF) 100 MCG/2ML IJ SOLN
INTRAMUSCULAR | Status: AC
  Filled 2014-06-01: qty 2

## 2014-06-01 MED ORDER — MEASLES, MUMPS & RUBELLA VAC ~~LOC~~ INJ
0.5000 mL | INJECTION | Freq: Once | SUBCUTANEOUS | Status: DC
  Administered 2014-06-03: 0.5 mL via SUBCUTANEOUS
  Filled 2014-06-01: qty 0.5

## 2014-06-01 MED ORDER — ZOLPIDEM TARTRATE 5 MG PO TABS
5.0000 mg | ORAL_TABLET | Freq: Every evening | ORAL | Status: DC | PRN
Start: 2014-06-01 — End: 2014-06-03

## 2014-06-01 SURGICAL SUPPLY — 37 items
APL SKNCLS STERI-STRIP NONHPOA (GAUZE/BANDAGES/DRESSINGS) ×1
BENZOIN TINCTURE PRP APPL 2/3 (GAUZE/BANDAGES/DRESSINGS) ×1 IMPLANT
CLAMP CORD UMBIL (MISCELLANEOUS) IMPLANT
CLOTH BEACON ORANGE TIMEOUT ST (SAFETY) ×2 IMPLANT
CONTAINER PREFILL 10% NBF 15ML (MISCELLANEOUS) ×2 IMPLANT
DRAPE SHEET LG 3/4 BI-LAMINATE (DRAPES) ×1 IMPLANT
DRSG OPSITE POSTOP 4X10 (GAUZE/BANDAGES/DRESSINGS) ×2 IMPLANT
DRSG TELFA 3X8 NADH (GAUZE/BANDAGES/DRESSINGS) ×2 IMPLANT
DURAPREP 26ML APPLICATOR (WOUND CARE) ×2 IMPLANT
ELECT REM PT RETURN 9FT ADLT (ELECTROSURGICAL) ×2
ELECTRODE REM PT RTRN 9FT ADLT (ELECTROSURGICAL) ×1 IMPLANT
EXTRACTOR VACUUM M CUP 4 TUBE (SUCTIONS) ×1 IMPLANT
GLOVE BIO SURGEON STRL SZ 6.5 (GLOVE) ×2 IMPLANT
GOWN STRL NON-REIN LRG LVL3 (GOWN DISPOSABLE) ×2 IMPLANT
GOWN STRL REUS W/TWL LRG LVL3 (GOWN DISPOSABLE) ×4 IMPLANT
KIT ABG SYR 3ML LUER SLIP (SYRINGE) IMPLANT
NDL HYPO 25X5/8 SAFETYGLIDE (NEEDLE) IMPLANT
NEEDLE HYPO 25X5/8 SAFETYGLIDE (NEEDLE) IMPLANT
NS IRRIG 1000ML POUR BTL (IV SOLUTION) ×2 IMPLANT
PACK C SECTION WH (CUSTOM PROCEDURE TRAY) ×2 IMPLANT
PAD DRESSING TELFA 3X8 NADH (GAUZE/BANDAGES/DRESSINGS) IMPLANT
PAD OB MATERNITY 4.3X12.25 (PERSONAL CARE ITEMS) ×2 IMPLANT
RTRCTR C-SECT PINK 25CM LRG (MISCELLANEOUS) ×2 IMPLANT
STAPLER VISISTAT 35W (STAPLE) IMPLANT
STRIP CLOSURE SKIN 1/4X4 (GAUZE/BANDAGES/DRESSINGS) ×1 IMPLANT
SUT MNCRL 0 VIOLET CTX 36 (SUTURE) ×2 IMPLANT
SUT MONOCRYL 0 CTX 36 (SUTURE) ×2
SUT PLAIN 1 NONE 54 (SUTURE) IMPLANT
SUT PLAIN 2 0 XLH (SUTURE) ×2 IMPLANT
SUT VIC AB 0 CT1 27 (SUTURE) ×4
SUT VIC AB 0 CT1 27XBRD ANBCTR (SUTURE) ×2 IMPLANT
SUT VIC AB 2-0 CT1 27 (SUTURE) ×2
SUT VIC AB 2-0 CT1 TAPERPNT 27 (SUTURE) ×1 IMPLANT
SUT VIC AB 4-0 KS 27 (SUTURE) IMPLANT
SYR BULB IRRIGATION 50ML (SYRINGE) ×2 IMPLANT
TOWEL OR 17X24 6PK STRL BLUE (TOWEL DISPOSABLE) ×2 IMPLANT
TRAY FOLEY CATH SILVER 14FR (SET/KITS/TRAYS/PACK) ×1 IMPLANT

## 2014-06-01 NOTE — Lactation Note (Signed)
This note was copied from the chart of [name] Earleen ReaperSarah Zeller. Lactation Consultation Note: Called to assist mom with latch after glucose gel protocol initiated. Mom diabetic on insulin. Baby would not latch- took a few sucks. Mom with flat nipples. Mom used manual pump and we spoon fed about 3 cc's of Colostrum.Attempted to nurse first baby but reports he didn't latch well and she never made enough milk for him. Baby off to sleep and skin to skin with mom. Bf brochure given with resources for support after DC. To call for assist prn.  Patient Name: [name] [name] ZOXWR'UToday's Date: 06/01/2014 Reason for consult: Initial assessment   Maternal Data Formula Feeding for Exclusion: No Has patient been taught Hand Expression?: Yes  Feeding Feeding Type: Breast Fed Length of feed: 4 min (sustained latch)  LATCH Score/Interventions Latch: Repeated attempts needed to sustain latch, nipple held in mouth throughout feeding, stimulation needed to elicit sucking reflex. (few sucks) Intervention(s): Adjust position;Assist with latch;Breast compression  Audible Swallowing: None Intervention(s): Skin to skin;Hand expression Intervention(s): Skin to skin;Hand expression  Type of Nipple: Flat Intervention(s): Hand pump  Comfort (Breast/Nipple): Soft / non-tender     Hold (Positioning): Assistance needed to correctly position infant at breast and maintain latch. Intervention(s): Breastfeeding basics reviewed;Skin to skin  LATCH Score: 5  Lactation Tools Discussed/Used     Consult Status Consult Status: Follow-up Date: 06/02/14 Follow-up type: In-patient    Pamelia HoitWeeks, Kimbree Casanas D 06/01/2014, 3:47 PM

## 2014-06-01 NOTE — Anesthesia Postprocedure Evaluation (Signed)
  Anesthesia Post-op Note  Patient: [name] BurlingtonSarah K [name]  Procedure(s) Performed: Procedure(s) with comments: CESAREAN SECTION WITH BILATERAL TUBAL LIGATION (Bilateral) - MD requests RNFA  Patient Location: Mother/Baby  Anesthesia Type:Spinal  Level of Consciousness: awake  Airway and Oxygen Therapy: Patient Spontanous Breathing  Post-op Pain: mild  Post-op Assessment: Patient's Cardiovascular Status Stable and Respiratory Function Stable  Post-op Vital Signs: stable  Last Vitals:  Filed Vitals:   06/01/14 1430  BP: 121/65  Pulse: 75  Temp: 36.6 C  Resp: 18    Complications: No apparent anesthesia complications

## 2014-06-01 NOTE — Op Note (Signed)
[name] [name], CART               ACCOUNT NO.:  [id number]  MEDICAL RECORD NO.:  [id number]  LOCATION:  9104                          FACILITY:  [facility]  PHYSICIAN:  Sherron Monday, MD        DATE OF BIRTH:  Jan 17, 1980  DATE OF PROCEDURE:  06/01/2014 DATE OF DISCHARGE:                              OPERATIVE REPORT   PREOPERATIVE DIAGNOSES:  Intrauterine pregnancy at 39+ weeks, history of low-transverse cesarean section, declines vaginal birth after cesarean section, desires sterilization.  POSTOPERATIVE DIAGNOSES:  Intrauterine pregnancy at 39+ weeks, history of low-transverse cesarean section, declines vaginal birth after cesarean section, desires sterilization, delivered.  PROCEDURE:  Repeat low-transverse cesarean section with bilateral tubal ligation, left salpingectomy and right partial salpingectomy.  SURGEON:  Sherron Monday, MD  ASSISTANT:  Herma Mering, RNFA.  ANESTHESIA:  Spinal.  EBL:  Approximately 800 mL.  IV FLUIDS:  3600 mL.  URINE OUTPUT:  150 mL clear urine at the end of the procedure.  FINDINGS:  Viable female infant at 7:57 a.m. with Apgars of 8 at 1 minute and 9 at 5 minutes and a weight of 7 pounds in the OR.  This is an unofficial weight.  Normal uterus, tubes and ovaries are noted.  COMPLICATIONS:  Some adhesions between the fimbria and ovary bilaterally.  PATHOLOGY:  Placenta to L and D, bilateral tubal segments to Pathology.  DESCRIPTION OF PROCEDURE:  After informed consent was reviewed with the patient including risks, benefits, and alternatives of the surgical procedure, she was transferred to the OR, placed on the table where spinal anesthesia was induced and found to be adequate.  She was then placed in a supine position with a leftward tilt.  After appropriate time-out, she was prepped and draped in the normal sterile fashion.  Her pannus was taped back.  A Pfannenstiel skin incision was made at the level approximately 2 fingerbreadths above her  pubic symphysis, carried through the underlying layer of fascia sharply.  The fascia was incised in the midline.  Next, the incision was extended laterally with Mayo scissors.  Superior aspect of the fascial incision was grasped with Kocher clamp and the rectus muscles were dissected off both bluntly and sharply.  At this time, it was noted that there was scarring to the midline and the peritoneum had been entered.  Carefully, the area of entry was outlined and extended superiorly and inferiorly with good visualization of the bladder.  Alexis skin retractor was placed carefully making sure that no bowel was entrapped.  The uterus was inspected carefully and incised in transverse fashion.  Infant was delivered in a vertex presentation with the aid of a vacuum.  Nose and mouth were suctioned.  The cord was clamped and cut.  Infant was handed off to the waiting pediatric staff.  The placenta was expressed from the uterus.  The uterus was cleared of all clot and debris.  The uterine incision was closed with a running locked in an imbricating layer of 0 Monocryl.  The gutters were cleared of all clot and debris.  The left tube was identified, followed out to the fimbriated end.  The tube was scarred to the ovary, this  was taken down with the aid of Bovie cautery. The tube was then excised and doubly ligated, was noted to be hemostatic.  After an additional suture of 3-0 Vicryl was placed at the ovary, was noted to be bleeding where the tube had been peeled. Attention was turned to the right side.  In a similar fashion, the right tube was noted to be scarred at the level of the ovary.  The decision was made to proceed with a traditional partial salpingectomy by the [place] method.  This was performed, ligated on either side with plain gut.  The peritoneum was approximated, attempted to be reapproximated and the subfascial planes with 2-0 Vicryl.  The subfascial planes were inspected, found to  be hemostatic.  The fascia was then reapproximated using 0 Vicryl in a running fashion as a single suture.  The subcuticular adipose layer was made hemostatic with Bovie cautery.  Dead space was closed with 0 Vicryl.  The skin was closed with 4-0 Vicryl in a subcuticular fashion with a Mellody Dance needle.  Benzoin and Steri-Strips were applied.  The patient tolerated the procedure well.  Sponge, lap, and needle count were correct x2 at the end of the procedure per the operating room staff.     Sherron Monday, MD     JB/MEDQ  D:  06/01/2014  T:  06/01/2014  Job:  [id number]

## 2014-06-01 NOTE — Anesthesia Preprocedure Evaluation (Addendum)
Anesthesia Evaluation  Patient identified by MRN, date of birth, ID band Patient awake    Reviewed: Allergy & Precautions, NPO status , Patient's Chart, lab work & pertinent test results  History of Anesthesia Complications Negative for: history of anesthetic complications  Airway Mallampati: II  TM Distance: >3 FB Neck ROM: Full    Dental  (+) Teeth Intact   Pulmonary neg shortness of breath, neg sleep apnea, neg COPDneg recent URI, Current Smoker,  breath sounds clear to auscultation        Cardiovascular hypertension, Pt. on medications Rhythm:Regular     Neuro/Psych PSYCHIATRIC DISORDERS Anxiety Depression  Neuromuscular disease    GI/Hepatic Neg liver ROS, GERD-  Controlled,  Endo/Other  diabetes, Insulin DependentMorbid obesity  Renal/GU negative Renal ROS     Musculoskeletal   Abdominal   Peds  Hematology negative hematology ROS (+)   Anesthesia Other Findings   Reproductive/Obstetrics (+) Pregnancy                            Anesthesia Physical Anesthesia Plan  ASA: II  Anesthesia Plan: Spinal   Post-op Pain Management:    Induction:   Airway Management Planned:   Additional Equipment: None  Intra-op Plan:   Post-operative Plan:   Informed Consent: I have reviewed the patients History and Physical, chart, labs and discussed the procedure including the risks, benefits and alternatives for the proposed anesthesia with the patient or authorized representative who has indicated his/her understanding and acceptance.   Dental advisory given  Plan Discussed with: CRNA and Surgeon  Anesthesia Plan Comments:         Anesthesia Quick Evaluation

## 2014-06-01 NOTE — Interval H&P Note (Signed)
History and Physical Interval Note:  06/01/2014 7:10 AM  [name] [name]  has presented today for surgery, with the diagnosis of Repeat C/Section and Sterilization, [phone], [phone]  The various methods of treatment have been discussed with the patient and family. After consideration of risks, benefits and other options for treatment, the patient has consented to  Procedure(s) with comments: CESAREAN SECTION WITH BILATERAL TUBAL LIGATION (Bilateral) - MD requests RNFA as a surgical intervention .  The patient's history has been reviewed, patient examined, no change in status, stable for surgery.  I have reviewed the patient's chart and labs.  Questions were answered to the patient's satisfaction.     Bovard-Stuckert, [place]

## 2014-06-01 NOTE — Transfer of Care (Signed)
Immediate Anesthesia Transfer of Care Note  Patient: [name] BurlingtonSarah K Gil  Procedure(s) Performed: Procedure(s) with comments: CESAREAN SECTION WITH BILATERAL TUBAL LIGATION (Bilateral) - MD requests RNFA  Patient Location: PACU  Anesthesia Type:Spinal  Level of Consciousness: awake, alert  and oriented  Airway & Oxygen Therapy: Patient Spontanous Breathing  Post-op Assessment: Report given to RN  Post vital signs: Reviewed  Last Vitals:  Filed Vitals:   06/01/14 0609  BP: 150/91  Pulse: 99  Temp: 36.6 C  Resp: 16    Complications: No apparent anesthesia complications

## 2014-06-01 NOTE — Anesthesia Postprocedure Evaluation (Signed)
  Anesthesia Post-op Note  Patient: [name] BurlingtonSarah K Auzenne  Procedure(s) Performed: Procedure(s) with comments: CESAREAN SECTION WITH BILATERAL TUBAL LIGATION (Bilateral) - MD requests RNFA  Patient Location: PACU  Anesthesia Type:Spinal  Level of Consciousness: awake  Airway and Oxygen Therapy: Patient Spontanous Breathing  Post-op Pain: none  Post-op Assessment: Post-op Vital signs reviewed, Patient's Cardiovascular Status Stable, Respiratory Function Stable, Patent Airway, No signs of Nausea or vomiting and Pain level controlled  Post-op Vital Signs: Reviewed and stable  Last Vitals:  Filed Vitals:   06/01/14 1220  BP: 125/65  Pulse: 60  Temp: 36.7 C  Resp: 18    Complications: No apparent anesthesia complications

## 2014-06-01 NOTE — Brief Op Note (Signed)
06/01/2014  8:58 AM  PATIENT:  [name] [name]  34 y.o. female  PRE-OPERATIVE DIAGNOSIS:  Repeat C/Section and Sterilization, [phone], [phone]  POST-OPERATIVE DIAGNOSIS:  Repeat C/Section and Sterilization, [phone], [phone]  PROCEDURE:  Procedure(s) with comments: CESAREAN SECTION WITH BILATERAL TUBAL LIGATION (Bilateral) - MD requests RNFA  SURGEON:  Surgeon(s) and Role:    * Sherian ReinJody Bovard-Stuckert, MD - Primary  ASSISTANTS: Karmen StabsSumner, Tracie, RNFA  ANESTHESIA:   spinal  EBL:  Total I/O In: 3600 [I.V.:3600] Out: 950 [Urine:150; Blood:800]  FINDINGS: viable female infant at 7:57am, apgars 8/9, wt 7# (unofficial), nl PP anat  BLOOD ADMINISTERED:none  DRAINS: Urinary Catheter (Foley)   LOCAL MEDICATIONS USED:  NONE  SPECIMEN:  Source of Specimen:  placenta, B tubal segments  DISPOSITION OF SPECIMEN:  L&D and pathology  COUNTS:  YES  TOURNIQUET:  * No tourniquets in log *  DICTATION: .Other Dictation: Dictation Number [id number]  PLAN OF CARE: Admit to inpatient   PATIENT DISPOSITION:  PACU - hemodynamically stable.   Delay start of Pharmacological VTE agent (>24hrs) due to surgical blood loss or risk of bleeding: not applicable

## 2014-06-01 NOTE — Progress Notes (Signed)
Patient ID: [name] BurlingtonSarah K Zarling, female   DOB: 1980-03-15, 34 y.o.   MRN: [id number] Pt appears stable after surgery She is awake and comfortable. Output acceptable

## 2014-06-01 NOTE — Anesthesia Procedure Notes (Signed)
Spinal Patient location during procedure: OB Staffing Anesthesiologist: Miran Kautzman, CHRIS Preanesthetic Checklist Completed: patient identified, surgical consent, pre-op evaluation, timeout performed, IV checked, risks and benefits discussed and monitors and equipment checked Spinal Block Patient position: sitting Prep: site prepped and draped and DuraPrep Patient monitoring: heart rate, cardiac monitor, continuous pulse ox and blood pressure Approach: midline Location: L3-4 Injection technique: single-shot Needle Needle type: Pencan  Needle gauge: 24 G Needle length: 10 cm Assessment Sensory level: T4   

## 2014-06-01 NOTE — Lactation Note (Signed)
This note was copied from the chart of [name] Earleen ReaperSarah Will. Lactation Consultation Note Follow up visit at 11 hours of age.  Called to room by NSY Rn assisting with low blood sugar of 36.  Baby is latched with Rn assist/hold on right breast and hand pumping on left breast.  LC assisted mom with hand expression on left breast.  Baby came off the breast several times and was spoon fed a total of about 2 mls.  Few swallows audible during feeding.  Mom has soft breasts with large flat nipples.  Breasts are widely spaced.  MOm reports having regular cycles in the past year but not previously.  LC has concerns about breast development and PCOS possibly undiagnosed.  MOm does report having sensitive breasts during pregnancy.  Mom reports not having milk come in with 36 year old after 1 week attempt.  Mom is also type II diabetic with insulin use during pregnancy.  Mom is recovering from c/s without a support person at bedside.  Discussed pumping although mom is holding baby STS now.  Fitted mom with a #24 NS with instructions on use and mom is able to return demonstration.  Mom is aware baby may need to be supplemented with formula if medically necessary.    Patient Name: [name] Earleen ReaperSarah Mayfield [id number]'s Date: 06/01/2014 Reason for consult: Initial assessment   Maternal Data Formula Feeding for Exclusion: No Has patient been taught Hand Expression?: Yes Does the patient have breastfeeding experience prior to this delivery?: Yes  Feeding Feeding Type: Breast Milk Length of feed: 10 min  LATCH Score/Interventions Latch: Repeated attempts needed to sustain latch, nipple held in mouth throughout feeding, stimulation needed to elicit sucking reflex. (few sucks) Intervention(s): Adjust position;Assist with latch;Breast compression  Audible Swallowing: None Intervention(s): Skin to skin;Hand expression Intervention(s): Skin to skin;Hand expression  Type of Nipple: Flat Intervention(s): Hand pump  Comfort  (Breast/Nipple): Soft / non-tender     Hold (Positioning): Assistance needed to correctly position infant at breast and maintain latch. Intervention(s): Breastfeeding basics reviewed;Skin to skin  LATCH Score: 5  Lactation Tools Discussed/Used     Consult Status Consult Status: Follow-up Date: 06/02/14 Follow-up type: In-patient    Jannifer RodneyShoptaw, Sailor Haughn Lynn 06/01/2014, 7:03 PM

## 2014-06-01 NOTE — Addendum Note (Signed)
Addendum  created 06/01/14 1623 by Renford DillsJanet L Nawal Burling, CRNA   Modules edited: Notes Section   Notes Section:  File: [id number]

## 2014-06-02 LAB — TYPE AND SCREEN
ABO/RH(D): O POS
Antibody Screen: NEGATIVE
UNIT DIVISION: 0
Unit division: 0

## 2014-06-02 LAB — GLUCOSE, CAPILLARY
GLUCOSE-CAPILLARY: 153 mg/dL — AB (ref 65–99)
Glucose-Capillary: 101 mg/dL — ABNORMAL HIGH (ref 65–99)
Glucose-Capillary: 112 mg/dL — ABNORMAL HIGH (ref 65–99)
Glucose-Capillary: 172 mg/dL — ABNORMAL HIGH (ref 65–99)
Glucose-Capillary: 86 mg/dL (ref 65–99)

## 2014-06-02 LAB — CBC
HCT: 31.4 % — ABNORMAL LOW (ref 36.0–46.0)
Hemoglobin: 10.8 g/dL — ABNORMAL LOW (ref 12.0–15.0)
MCH: 28.5 pg (ref 26.0–34.0)
MCHC: 34.4 g/dL (ref 30.0–36.0)
MCV: 82.8 fL (ref 78.0–100.0)
Platelets: 179 10*3/uL (ref 150–400)
RBC: 3.79 MIL/uL — AB (ref 3.87–5.11)
RDW: 14.5 % (ref 11.5–15.5)
WBC: 8.1 10*3/uL (ref 4.0–10.5)

## 2014-06-02 LAB — BIRTH TISSUE RECOVERY COLLECTION (PLACENTA DONATION)

## 2014-06-02 NOTE — Progress Notes (Signed)
On assessment, noted that right hand was puffy around IV site.  Determined infiltration had occurred.  Stopped fluids and d/c'd IV site.  D/t low urine output in urinary catheter, a new IV site was started in the left forearm.  The right hand was elevated.  MD on call, Dr. Ambrose MantleHenley was notified.  No new orders at that time. Will continue to monitor.

## 2014-06-02 NOTE — Progress Notes (Signed)
Patient ID: [name] BurlingtonSarah K Tuley, female   DOB: [birth year], 34 y.o.   MRN: [id number] #1 afebrile Blood sugars normal Output excellent No complaints. She has voided and  tolerated solid food

## 2014-06-03 LAB — GLUCOSE, CAPILLARY: GLUCOSE-CAPILLARY: 110 mg/dL — AB (ref 65–99)

## 2014-06-03 MED ORDER — MEASLES, MUMPS & RUBELLA VAC ~~LOC~~ INJ
0.5000 mL | INJECTION | Freq: Once | SUBCUTANEOUS | Status: DC
  Filled 2014-06-03: qty 0.5

## 2014-06-03 MED ORDER — CEPHALEXIN 500 MG PO CAPS: 500.0000 mg | ORAL_CAPSULE | Freq: Three times a day (TID) | ORAL | Status: DC

## 2014-06-03 MED ORDER — PRENATAL MULTIVITAMIN CH: 1.0000 | ORAL_TABLET | Freq: Every day | ORAL | Status: DC

## 2014-06-03 MED ORDER — IBUPROFEN 800 MG PO TABS: 800.0000 mg | ORAL_TABLET | Freq: Three times a day (TID) | ORAL | Status: DC | PRN

## 2014-06-03 MED ORDER — METFORMIN HCL 500 MG PO TABS: 500.0000 mg | ORAL_TABLET | Freq: Two times a day (BID) | ORAL | Status: DC

## 2014-06-03 MED ORDER — OXYCODONE-ACETAMINOPHEN 5-325 MG PO TABS: 1.0000 | ORAL_TABLET | Freq: Four times a day (QID) | ORAL | Status: DC | PRN

## 2014-06-03 NOTE — Discharge Summary (Signed)
Obstetric Discharge Summary Reason for Admission: cesarean section Prenatal Procedures: none Intrapartum Procedures: cesarean: low cervical, transverse and tubal ligation Postpartum Procedures: none Complications-Operative and Postpartum: none and incisional erythema (cellulitis?) HEMOGLOBIN  Date Value Ref Range Status  06/02/2014 10.8* 12.0 - 15.0 g/dL Final   HCT  Date Value Ref Range Status  06/02/2014 31.4* 36.0 - 46.0 % Final    Physical Exam:  General: alert and no distress Lochia: appropriate Uterine Fundus: firm Incision: healing well, erythema (bruising vs cellulitis) DVT Evaluation: No evidence of DVT seen on physical exam.  Discharge Diagnoses: Term Pregnancy-delivered  Discharge Information: Date: 06/03/2014 Activity: pelvic rest Diet: routine Medications: PNV, Ibuprofen, Percocet and Keflex Condition: stable Instructions: refer to practice specific booklet Discharge to: home Follow-up Information    Follow up with Bovard-Stuckert, [place], MD. Schedule an appointment as soon as possible for a visit in 1 week.   Specialty:  Obstetrics and Gynecology   Why:  for incision check and circumciaion, 2 weeks for incision check, 6 weeks for full postpartum check   Contact information:   [address] [place] [address] [phone]       Newborn Data: Live born female  Birth Weight: 7 lb 1.1 oz (3205 g) APGAR: 8, 9  Home with mother.  Bovard-Stuckert, Kristena Wilhelmi 06/03/2014, 11:46 AM

## 2014-06-03 NOTE — Progress Notes (Signed)
CLINICAL SOCIAL WORK MATERNAL/CHILD NOTE  Patient Details  Name: [name] Earleen ReaperSarah [name] MRN: [id number] Date of Birth: 06/01/2016  Date: 06/03/2014  Clinical Social Worker Initiating Note: Johnnye Lanaumi Daymion Nazaire, LCSWDate/ Time Initiated: 06/03/14/1000   Child's Name: [name] [name]   Legal Guardian:  (Parents D[place] and Jewel BaizePhillip Tyminski)   Need for Interpreter: None   Date of Referral: 06/02/14   Reason for Referral: Other (Comment)   [facility]   Address: [address] [place], [place] [address]  Phone number:  [phone])   Household Members: Minor Children (maternal grandparents)   Natural Supports (not living in the home): Immediate Family, Extended Family   Professional Supports:None   Employment: (MOB is employed)   Type of Work:     Education:     Surveyor, quantityinancial Resources:Medicaid   Other Resources: Information systems managerChild Support, WIC, Sales executiveood Stamps    Cultural/Religious Considerations Which May Impact Care: none noted  Strengths: Ability to meet basic needs , Home prepared for child    Risk Factors/Current Problems:  (At risk for PP Depression)   Cognitive State: Alert    Mood/Affect: Happy , Bright , Interested    CSW Assessment: Acknowledged order for social work consult to assess mother's hx of depression and anxiety. Parents also separated when mother was [redacted] weeks pregnant. Informed that they had been married for 19 years and all of a sudden he decided that he did not want to be married anymore. Mother states that the separation was unexpected. Informed that he did come for the delivery, but him being here was awkward. They have one other dependent age 367. Mother reports hx of anxiety, depression and panic attacks. Informed that she has a strong family history of depression, and she has been dealing with depression and anxiety for most of her life. Informed that she has been treated with  therapy and medication, but is currently only being treated with medication. Mother states that maternal and paternal relatives have been very supportive, and she expect them to continue to be supportive. She's currently living with her parents. Spoke with mother regarding the importance of self-care. She became emotional at times during the conversation. Provided supportive feedback and allowed her to vent. No acute social concerns noted or reported at this time. Mother informed of social work Surveyor, miningavailability.  CSW Plan/Description:    Education - PP Depression information and resources No further intervention required No barriers to discharge  Henretter Piekarski J, LCSW 06/03/2014, 2:31 PM

## 2014-06-03 NOTE — Progress Notes (Signed)
Subjective: Postpartum Day 2: Cesarean Delivery Patient reports incisional pain, tolerating PO and no problems voiding.  Desires early discharge.    Objective: Vital signs in last 24 hours: Temp:  [97.6 F (36.4 C)-98.4 F (36.9 C)] 97.6 F (36.4 C) (05/22 0615) Pulse Rate:  [64-65] 64 (05/22 0615) Resp:  [17-20] 17 (05/22 0615) BP: (117-147)/(60-82) 117/82 mmHg (05/22 0615)  Physical Exam:  General: alert and no distress Lochia: appropriate Uterine Fundus: firm Incision: healing well, some erythema, bruising (?cellulitis) DVT Evaluation: No evidence of DVT seen on physical exam.   Recent Labs  06/02/14 0543  HGB 10.8*  HCT 31.4*    Assessment/Plan: Status post Cesarean section. Doing well postoperatively.  Discharge home with standard precautions and return to clinic in 1,2,and 6 weeks.  Bovard-Stuckert, [name] [name] 06/03/2014, 11:39 AM

## 2014-06-04 ENCOUNTER — Encounter (HOSPITAL_COMMUNITY): Payer: Self-pay | Admitting: Obstetrics and Gynecology

## 2015-05-08 ENCOUNTER — Ambulatory Visit (HOSPITAL_COMMUNITY)
Admission: EM | Admit: 2015-05-08 | Discharge: 2015-05-08 | Disposition: A | Discharge status: Home / Self Care | Payer: Medicaid Other | Attending: Emergency Medicine | Admitting: Emergency Medicine

## 2015-05-08 ENCOUNTER — Encounter (HOSPITAL_COMMUNITY): Payer: Self-pay | Admitting: Emergency Medicine

## 2015-05-08 DIAGNOSIS — W57XXXA Bitten or stung by nonvenomous insect and other nonvenomous arthropods, initial encounter: Secondary | ICD-10-CM

## 2015-05-08 DIAGNOSIS — T148 Other injury of unspecified body region: Secondary | ICD-10-CM | POA: Diagnosis not present

## 2015-05-08 DIAGNOSIS — S86012A Strain of left Achilles tendon, initial encounter: Secondary | ICD-10-CM

## 2015-05-08 DIAGNOSIS — S86002A Unspecified injury of left Achilles tendon, initial encounter: Secondary | ICD-10-CM | POA: Diagnosis not present

## 2015-05-08 MED ORDER — TRIAMCINOLONE ACETONIDE 0.1 % EX CREA: 1.0000 "application " | TOPICAL_CREAM | Freq: Two times a day (BID) | CUTANEOUS | Status: DC

## 2015-05-08 NOTE — Discharge Instructions (Signed)
Ankle Sprain You have an injury to the achilles tendon. It is a good sign that you are able to range her foot and point her foot down. This indicates that your tendon is grossly intact. It does not mean that there may be a partial tear or rupture in a portion of the tendon.continue to wear the cam walker and apply cold compresses to the heel. Limit her weightbearing. Call the orthopedist on page one for an appointment to see early next week. If, in the meantime there is any worsening or new symptom you may return. It is best that you make this follow-up appointment to a sure that your tendon is completely intact. An ankle sprain is an injury to the strong, fibrous tissues (ligaments) that hold the bones of your ankle joint together.  CAUSES An ankle sprain is usually caused by a fall or by twisting your ankle. Ankle sprains most commonly occur when you step on the outer edge of your foot, and your ankle turns inward. People who participate in sports are more prone to these types of injuries.  SYMPTOMS   Pain in your ankle. The pain may be present at rest or only when you are trying to stand or walk.  Swelling.  Bruising. Bruising may develop immediately or within 1 to 2 days after your injury.  Difficulty standing or walking, particularly when turning corners or changing directions. DIAGNOSIS  Your caregiver will ask you details about your injury and perform a physical exam of your ankle to determine if you have an ankle sprain. During the physical exam, your caregiver will press on and apply pressure to specific areas of your foot and ankle. Your caregiver will try to move your ankle in certain ways. An X-ray exam may be done to be sure a bone was not broken or a ligament did not separate from one of the bones in your ankle (avulsion fracture).  TREATMENT  Certain types of braces can help stabilize your ankle. Your caregiver can make a recommendation for this. Your caregiver may recommend the  use of medicine for pain. If your sprain is severe, your caregiver may refer you to a surgeon who helps to restore function to parts of your skeletal system (orthopedist) or a physical therapist. HOME CARE INSTRUCTIONS   Apply ice to your injury for 1-2 days or as directed by your caregiver. Applying ice helps to reduce inflammation and pain.  Put ice in a plastic bag.  Place a towel between your skin and the bag.  Leave the ice on for 15-20 minutes at a time, every 2 hours while you are awake.  Only take over-the-counter or prescription medicines for pain, discomfort, or fever as directed by your caregiver.  Elevate your injured ankle above the level of your heart as much as possible for 2-3 days.  If your caregiver recommends crutches, use them as instructed. Gradually put weight on the affected ankle. Continue to use crutches or a cane until you can walk without feeling pain in your ankle.  If you have a plaster splint, wear the splint as directed by your caregiver. Do not rest it on anything harder than a pillow for the first 24 hours. Do not put weight on it. Do not get it wet. You may take it off to take a shower or bath.  You may have been given an elastic bandage to wear around your ankle to provide support. If the elastic bandage is too tight (you have numbness or tingling  in your foot or your foot becomes cold and blue), adjust the bandage to make it comfortable.  If you have an air splint, you may blow more air into it or let air out to make it more comfortable. You may take your splint off at night and before taking a shower or bath. Wiggle your toes in the splint several times per day to decrease swelling. SEEK MEDICAL CARE IF:   You have rapidly increasing bruising or swelling.  Your toes feel extremely cold or you lose feeling in your foot.  Your pain is not relieved with medicine. SEEK IMMEDIATE MEDICAL CARE IF:  Your toes are numb or blue.  You have severe pain that  is increasing. MAKE SURE YOU:   Understand these instructions.  Will watch your condition.  Will get help right away if you are not doing well or get worse.   This information is not intended to replace advice given to you by your health care provider. Make sure you discuss any questions you have with your health care provider.   Document Released: 12/29/2004 Document Revised: 01/19/2014 Document Reviewed: 01/10/2011 Elsevier Interactive Patient Education 2016 [employer]  Cryotherapy Cryotherapy means treatment with cold. Ice or gel packs can be used to reduce both pain and swelling. Ice is the most helpful within the first 24 to 48 hours after an injury or flare-up from overusing a muscle or joint. Sprains, strains, spasms, burning pain, shooting pain, and aches can all be eased with ice. Ice can also be used when recovering from surgery. Ice is effective, has very few side effects, and is safe for most people to use. PRECAUTIONS  Ice is not a safe treatment option for people with:  Raynaud phenomenon. This is a condition affecting small blood vessels in the extremities. Exposure to cold may cause your problems to return.  Cold hypersensitivity. There are many forms of cold hypersensitivity, including:  Cold urticaria. Red, itchy hives appear on the skin when the tissues begin to warm after being iced.  Cold erythema. This is a red, itchy rash caused by exposure to cold.  Cold hemoglobinuria. Red blood cells break down when the tissues begin to warm after being iced. The hemoglobin that carry oxygen are passed into the urine because they cannot combine with blood proteins fast enough.  Numbness or altered sensitivity in the area being iced. If you have any of the following conditions, do not use ice until you have discussed cryotherapy with your caregiver:  Heart conditions, such as arrhythmia, angina, or chronic heart disease.  High blood pressure.  Healing wounds or open  skin in the area being iced.  Current infections.  Rheumatoid arthritis.  Poor circulation.  Diabetes. Ice slows the blood flow in the region it is applied. This is beneficial when trying to stop inflamed tissues from spreading irritating chemicals to surrounding tissues. However, if you expose your skin to cold temperatures for too long or without the proper protection, you can damage your skin or nerves. Watch for signs of skin damage due to cold. HOME CARE INSTRUCTIONS Follow these tips to use ice and cold packs safely.  Place a dry or damp towel between the ice and skin. A damp towel will cool the skin more quickly, so you may need to shorten the time that the ice is used.  For a more rapid response, add gentle compression to the ice.  Ice for no more than 10 to 20 minutes at a time. The bonier  the area you are icing, the less time it will take to get the benefits of ice.  Check your skin after 5 minutes to make sure there are no signs of a poor response to cold or skin damage.  Rest 20 minutes or more between uses.  Once your skin is numb, you can end your treatment. You can test numbness by very lightly touching your skin. The touch should be so light that you do not see the skin dimple from the pressure of your fingertip. When using ice, most people will feel these normal sensations in this order: cold, burning, aching, and numbness.  Do not use ice on someone who cannot communicate their responses to pain, such as small children or people with dementia. HOW TO MAKE AN ICE PACK Ice packs are the most common way to use ice therapy. Other methods include ice massage, ice baths, and cryosprays. Muscle creams that cause a cold, tingly feeling do not offer the same benefits that ice offers and should not be used as a substitute unless recommended by your caregiver. To make an ice pack, do one of the following:  Place crushed ice or a bag of frozen vegetables in a sealable plastic bag.  Squeeze out the excess air. Place this bag inside another plastic bag. Slide the bag into a pillowcase or place a damp towel between your skin and the bag.  Mix 3 parts water with 1 part rubbing alcohol. Freeze the mixture in a sealable plastic bag. When you remove the mixture from the freezer, it will be slushy. Squeeze out the excess air. Place this bag inside another plastic bag. Slide the bag into a pillowcase or place a damp towel between your skin and the bag. SEEK MEDICAL CARE IF:  You develop white spots on your skin. This may give the skin a blotchy (mottled) appearance.  Your skin turns blue or pale.  Your skin becomes waxy or hard.  Your swelling gets worse. MAKE SURE YOU:   Understand these instructions.  Will watch your condition.  Will get help right away if you are not doing well or get worse.   This information is not intended to replace advice given to you by your health care provider. Make sure you discuss any questions you have with your health care provider.   Document Released: 08/25/2010 Document Revised: 01/19/2014 Document Reviewed: 08/25/2010 Elsevier Interactive Patient Education 2016 Else[employer] Inc[employer]  Tendon Injury Tendons are strong, cordlike structures that connect muscle to bone. Tendons are made up of woven fibers, like a rope. A tendon injury is a tear (rupture) of the tendon. The rupture may be partial (only a few of the fibers in your tendon rupture) or complete (your entire tendon ruptures). CAUSES  Tendon injuries can be caused by high-stress activities, such as sports. They also can be caused by a repetitive injury or by a single injury from an excessive, rapid force. SYMPTOMS  Symptoms of tendon injury include pain when you move the joint close to the tendon. Other symptoms are swelling, redness, and warmth. DIAGNOSIS  Tendon injuries often can be diagnosed by physical exam. However, sometimes an X-ray exam or advanced imaging, such as magnetic  resonance imaging (MRI), is necessary to determine the extent of the injury. TREATMENT  Partial tendon ruptures often can be treated with immobilization. A splint, bandage, or removable brace usually is used to immobilize the injured tendon. Most injured tendons need to be immobilized for 1-2 months before they are completely  healed. Complete tendon ruptures may require surgical reattachment.   This information is not intended to replace advice given to you by your health care provider. Make sure you discuss any questions you have with your health care provider.   Document Released: 02/06/2004 Document Revised: 12/18/2010 Document Reviewed: 03/22/2011 Elsevier Interactive Patient Education [employer] Inc[employer]

## 2015-05-08 NOTE — ED Provider Notes (Signed)
CSN: [id number]     Arrival date & time 05/08/15  1430 History   First MD Initiated Contact with Patient 05/08/15 1521     Chief Complaint  Patient presents with  . Ankle Injury  . Insect Bite   (Consider location/radiation/quality/duration/timing/severity/associated sxs/prior Treatment) HPI Comments: 35 year old female states she was out hiking approximately 2 weeks ago and "rolled" her left ankle. She is complaining of pain to the posterior aspect of the left ankle. At the time of the incident she continued hiking. After that. At time shehas continued ambulating in the past 2 weeks with weightbearing. The pain has increased as well as localized tenderness. She states the pain is quite sharp tickly with ambulation and plantarflexion.  Second complaint is that of a probable insect bite to the right deltoid. It has increased in size and it is mildly erythematous andhighly pruritic.  Patient is a 35 y.o. female presenting with lower extremity injury.  Ankle Injury Pertinent negatives include no chest pain.    Past Medical History  Diagnosis Date  . GERD (gastroesophageal reflux disease)   . Fatty liver disease, nonalcoholic   . Diabetes mellitus without complication (HCC)   . Obesity   . Hypertension   . Anxiety   . Depression   . Pelvis fracture (HCC)   . Back fracture   . H/O cesarean section complicating pregnancy 04/18/2014  . Family history of adverse reaction to anesthesia     sister gets nausea and vomitting and itching on her face. Around her nose and mouth.  . Neuromuscular disorder (HCC)     eye twitch on left eye   . S/P cesarean section 06/01/2014  . S/P tubal ligation 06/01/2014   Past Surgical History  Procedure Laterality Date  . Cesarean section    . Tonsillectomy    . Chest tube insertion    . Tracheostomy    . Cesarean section with bilateral tubal ligation Bilateral 06/01/2014    Procedure: CESAREAN SECTION WITH BILATERAL TUBAL LIGATION;  Surgeon: Sherian ReinJody  Bovard-Stuckert, MD;  Location: [facility];  Service: Obstetrics;  Laterality: Bilateral;  MD requests RNFA   Family History  Problem Relation Age of Onset  . Diabetes Maternal Grandmother   . Diabetes Paternal Grandmother    Social History  Substance Use Topics  . Smoking status: Current Every Day Smoker -- 1.00 packs/day for 12 years    Types: Cigarettes  . Smokeless tobacco: Never Used  . Alcohol Use: No   OB History    Gravida Para Term Preterm AB TAB SAB Ectopic Multiple Living   2 2 2       0 2     Review of Systems  Constitutional: Negative.  Negative for fever and fatigue.  HENT: Negative.   Respiratory: Negative.   Cardiovascular: Negative for chest pain.  Gastrointestinal: Negative.   Musculoskeletal: Positive for gait problem. Negative for myalgias, joint swelling and neck pain.  Skin: Negative for rash.       As per history of present illness.  Neurological: Negative.   Psychiatric/Behavioral: Negative.     Allergies  Review of patient's allergies indicates no known allergies.  Home Medications   Prior to Admission medications   Medication Sig Start Date End Date Taking? Authorizing Provider  hydrOXYzine (VISTARIL) 25 MG capsule Take 25 mg by mouth 3 (three) times daily as needed.   Yes Historical Provider, MD  ibuprofen (ADVIL,MOTRIN) 800 MG tablet Take 1 tablet (800 mg total) by mouth every 8 (eight) hours as  needed. 06/03/14  Yes Jody Bovard-Stuckert, MD  lamoTRIgine (LAMICTAL) 25 MG tablet Take 25 mg by mouth daily.   Yes Historical Provider, MD  lisinopril (PRINIVIL,ZESTRIL) 10 MG tablet Take 10 mg by mouth daily.   Yes Historical Provider, MD  insulin NPH Human (HUMULIN N,NOVOLIN N) 100 UNIT/ML injection Inject 12-18 Units into the skin 2 (two) times daily before a meal. Pt takes 18 units in the AM and 12 units in the PM.    Historical Provider, MD  insulin regular (NOVOLIN R,HUMULIN R) 100 units/mL injection Inject 10 Units into the skin 2 (two) times daily  before a meal. Pt takes 10 units in the AM and 12 units in the PM.    Historical Provider, MD  metFORMIN (GLUCOPHAGE) 500 MG tablet Take 1 tablet (500 mg total) by mouth 2 (two) times daily with a meal. 06/03/14   Sherian Rein, MD  oxyCODONE-acetaminophen (PERCOCET/ROXICET) 5-325 MG per tablet Take 1-2 tablets by mouth every 6 (six) hours as needed (for pain scale greater than 7). 06/03/14   Sherian Rein, MD  Prenatal Vit-Fe Fumarate-FA (BL PRENATAL VITAMINS PO) Take 1 tablet by mouth daily.     Historical Provider, MD  sertraline (ZOLOFT) 50 MG tablet Take 50 mg by mouth daily.    Historical Provider, MD  triamcinolone cream (KENALOG) 0.1 % Apply 1 application topically 2 (two) times daily. 05/08/15   Hayden Rasmussen, NP   Meds Ordered and Administered this Visit  Medications - No data to display  BP 142/83 mmHg  Pulse 74  Temp(Src) 98.6 F (37 C) (Oral)  Resp 16  SpO2 99%  LMP 05/06/2015 (Approximate) No data found.   Physical Exam  Constitutional: She is oriented to person, place, and time. She appears well-developed and well-nourished. No distress.  Eyes: EOM are normal.  Neck: Normal range of motion. Neck supple.  Cardiovascular: Normal rate.   Pulmonary/Chest: Effort normal. No respiratory distress.  Musculoskeletal: Normal range of motion. She exhibits tenderness. She exhibits no edema.  There is no swelling, deformity, pain or tenderness to the foot. It is no bony tenderness to the ankle. No swelling. There is marked tenderness to the Achilles tendon. It is not inflamed. There are no visible or palpable demarcations or deformities that clearly indicate a rupture. Increased pain of the Achilles tendon with plantar flexion. Distal neurovascular motor sensory is intact. Pedal pulses 2+  Neurological: She is alert and oriented to person, place, and time. She exhibits normal muscle tone.  Skin: Skin is warm and dry.  Located over the right mid deltoid there is an annular  area of erythema with several small vesicles within the margins. Diameter is approximately 1.5 cm.It is well marginated. No lymphangitis or surrounding erythema or cellulitis. No drainage. No purulence or bleeding.  Psychiatric: She has a normal mood and affect.  Nursing note and vitals reviewed.   ED Course  Procedures (including critical care time)  Labs Review Labs Reviewed - No data to display  Imaging Review No results found.   Visual Acuity Review  Right Eye Distance:   Left Eye Distance:   Bilateral Distance:    Right Eye Near:   Left Eye Near:    Bilateral Near:         MDM   1. Achilles tendon injury, left, initial encounter   2. Achilles tendon sprain, left, initial encounter   3. Insect bite    You have an injury to the achilles tendon. It is a good sign  that you are able to range her foot and point her foot down. This indicates that your tendon is grossly intact. It does not mean that there may be a partial tear or rupture in a portion of the tendon.continue to wear the cam walker and apply cold compresses to the heel. Limit her weightbearing. Call the orthopedist on page one for an appointment to see early next week. If, in the meantime there is any worsening or new symptom you may return. It is best that you make this follow-up appointment to a sure that your tendon is completely intact.  Cam walker, ice, elevation,  Meds ordered this encounter  Medications  . lisinopril (PRINIVIL,ZESTRIL) 10 MG tablet    Sig: Take 10 mg by mouth daily.  . hydrOXYzine (VISTARIL) 25 MG capsule    Sig: Take 25 mg by mouth 3 (three) times daily as needed.  . lamoTRIgine (LAMICTAL) 25 MG tablet    Sig: Take 25 mg by mouth daily.  Marland Kitchen triamcinolone cream (KENALOG) 0.1 %    Sig: Apply 1 application topically 2 (two) times daily.    Dispense:  30 g    Refill:  0    Order Specific Question:  Supervising Provider    Answer:  Charm [employer] [id number]   First 3 above are chronic  meds.    Hayden Rasmussen, NP 05/08/15 [phone]

## 2015-05-08 NOTE — ED Notes (Signed)
Pt was camping two weeks ago when she tripped and twisted her left ankle.  She reports the ankle is still swollen and is a 10/10 pain with ambulation.  She reports the pain is in the back of her heel and achilles area.  She also got an insect bite on the back of her right upper arm on the trip and it is getting bigger and has small blisters on it.

## 2015-07-30 ENCOUNTER — Emergency Department (HOSPITAL_COMMUNITY)
Admission: EM | Admit: 2015-07-30 | Discharge: 2015-07-30 | Disposition: A | Discharge status: Home / Self Care | Payer: Medicaid Other | Attending: Emergency Medicine | Admitting: Emergency Medicine

## 2015-07-30 ENCOUNTER — Encounter (HOSPITAL_COMMUNITY): Payer: Self-pay | Admitting: Emergency Medicine

## 2015-07-30 DIAGNOSIS — I10 Essential (primary) hypertension: Secondary | ICD-10-CM | POA: Insufficient documentation

## 2015-07-30 DIAGNOSIS — N390 Urinary tract infection, site not specified: Secondary | ICD-10-CM | POA: Insufficient documentation

## 2015-07-30 DIAGNOSIS — Z794 Long term (current) use of insulin: Secondary | ICD-10-CM | POA: Diagnosis not present

## 2015-07-30 DIAGNOSIS — F17210 Nicotine dependence, cigarettes, uncomplicated: Secondary | ICD-10-CM | POA: Insufficient documentation

## 2015-07-30 DIAGNOSIS — Z7984 Long term (current) use of oral hypoglycemic drugs: Secondary | ICD-10-CM | POA: Diagnosis not present

## 2015-07-30 DIAGNOSIS — E119 Type 2 diabetes mellitus without complications: Secondary | ICD-10-CM | POA: Diagnosis not present

## 2015-07-30 LAB — URINALYSIS, ROUTINE W REFLEX MICROSCOPIC
BILIRUBIN URINE: NEGATIVE
Glucose, UA: NEGATIVE mg/dL
KETONES UR: NEGATIVE mg/dL
Nitrite: POSITIVE — AB
Protein, ur: 30 mg/dL — AB
Specific Gravity, Urine: 1.018 (ref 1.005–1.030)
pH: 5.5 (ref 5.0–8.0)

## 2015-07-30 LAB — URINE MICROSCOPIC-ADD ON

## 2015-07-30 LAB — POC URINE PREG, ED: PREG TEST UR: NEGATIVE

## 2015-07-30 MED ORDER — PHENAZOPYRIDINE HCL 200 MG PO TABS: 200.0000 mg | ORAL_TABLET | Freq: Three times a day (TID) | ORAL | Status: DC | PRN

## 2015-07-30 MED ORDER — CEPHALEXIN 250 MG PO CAPS: 250.0000 mg | ORAL_CAPSULE | Freq: Four times a day (QID) | ORAL | Status: DC

## 2015-07-30 NOTE — ED Provider Notes (Signed)
CSN: [id number]     Arrival date & time 07/30/15  1032 History   First MD Initiated Contact with Patient 07/30/15 1322     Chief Complaint  Patient presents with  . Urinary Tract Infection    (Consider location/radiation/quality/duration/timing/severity/associated sxs/prior Treatment) HPI   [name] [name] is a 35-y/o female who presents with dysuria x 2 days. She says it feels like "razor blades" when she urinates and that she has noticed blood when she wipes. She denies urgency or increased frequency. She also complains of lower abdominal pain. Pain does not radiate elsewhere. She denies fever, chills or change in appetite. She denies history of kidney stones. She last had sex 2 days ago and denies increased vaginal discharge. She is only sexually active with her fiance and is not concerned about STDs. She has not tried anything for pain.   Past Medical History  Diagnosis Date  . GERD (gastroesophageal reflux disease)   . Fatty liver disease, nonalcoholic   . Diabetes mellitus without complication (HCC)   . Obesity   . Hypertension   . Anxiety   . Depression   . Pelvis fracture (HCC)   . Back fracture   . H/O cesarean section complicating pregnancy 04/18/2014  . Family history of adverse reaction to anesthesia     sister gets nausea and vomitting and itching on her face. Around her nose and mouth.  . Neuromuscular disorder (HCC)     eye twitch on left eye   . S/P cesarean section 06/01/2014  . S/P tubal ligation 06/01/2014   Past Surgical History  Procedure Laterality Date  . Cesarean section    . Tonsillectomy    . Chest tube insertion    . Tracheostomy    . Cesarean section with bilateral tubal ligation Bilateral 06/01/2014    Procedure: CESAREAN SECTION WITH BILATERAL TUBAL LIGATION;  Surgeon: Sherian Rein, MD;  Location: [facility];  Service: Obstetrics;  Laterality: Bilateral;  MD requests RNFA   Family History  Problem Relation Age of Onset  . Diabetes Maternal  Grandmother   . Diabetes Paternal Grandmother    Social History  Substance Use Topics  . Smoking status: Current Every Day Smoker -- 1.00 packs/day for 12 years    Types: Cigarettes  . Smokeless tobacco: Never Used  . Alcohol Use: No   OB History    Gravida Para Term Preterm AB TAB SAB Ectopic Multiple Living   2 2 2       0 2     Review of Systems  Constitutional: Negative for fever, chills, diaphoresis and appetite change.  HENT: Negative for congestion and rhinorrhea.   Respiratory: Negative for cough and shortness of breath.   Cardiovascular: Negative for chest pain.  Gastrointestinal: Negative for nausea, vomiting, diarrhea and constipation.  Genitourinary: Positive for hematuria. Negative for vaginal discharge.  Musculoskeletal: Negative for myalgias and back pain.  Skin: Negative for rash.      Allergies  Review of patient's allergies indicates no known allergies.  Home Medications   Prior to Admission medications   Medication Sig Start Date End Date Taking? Authorizing Provider  cephALEXin (KEFLEX) 250 MG capsule Take 1 capsule (250 mg total) by mouth 4 (four) times daily. 07/30/15   Hillary Percell Boston, MD  hydrOXYzine (VISTARIL) 25 MG capsule Take 25 mg by mouth 3 (three) times daily as needed.    Historical Provider, MD  ibuprofen (ADVIL,MOTRIN) 800 MG tablet Take 1 tablet (800 mg total) by mouth every 8 (eight) hours  as needed. 06/03/14   Jody Bovard-Stuckert, MD  insulin NPH Human (HUMULIN N,NOVOLIN N) 100 UNIT/ML injection Inject 12-18 Units into the skin 2 (two) times daily before a meal. Pt takes 18 units in the AM and 12 units in the PM.    Historical Provider, MD  insulin regular (NOVOLIN R,HUMULIN R) 100 units/mL injection Inject 10 Units into the skin 2 (two) times daily before a meal. Pt takes 10 units in the AM and 12 units in the PM.    Historical Provider, MD  lamoTRIgine (LAMICTAL) 25 MG tablet Take 25 mg by mouth daily.    Historical Provider, MD   lisinopril (PRINIVIL,ZESTRIL) 10 MG tablet Take 10 mg by mouth daily.    Historical Provider, MD  metFORMIN (GLUCOPHAGE) 500 MG tablet Take 1 tablet (500 mg total) by mouth 2 (two) times daily with a meal. 06/03/14   Sherian Rein, MD  oxyCODONE-acetaminophen (PERCOCET/ROXICET) 5-325 MG per tablet Take 1-2 tablets by mouth every 6 (six) hours as needed (for pain scale greater than 7). 06/03/14   Sherian Rein, MD  phenazopyridine (PYRIDIUM) 200 MG tablet Take 1 tablet (200 mg total) by mouth 3 (three) times daily as needed for pain. Can take for 2 days while also on antibiotic. 07/30/15   Hillary Percell Boston, MD  Prenatal Vit-Fe Fumarate-FA (BL PRENATAL VITAMINS PO) Take 1 tablet by mouth daily.     Historical Provider, MD  sertraline (ZOLOFT) 50 MG tablet Take 50 mg by mouth daily.    Historical Provider, MD  triamcinolone cream (KENALOG) 0.1 % Apply 1 application topically 2 (two) times daily. 05/08/15   Hayden Rasmussen, NP   BP 139/69 mmHg  Pulse 69  Temp(Src) 97.8 F (36.6 C) (Oral)  Resp 16  SpO2 98% Physical Exam  Constitutional: She appears well-developed and well-nourished. She appears distressed.  HENT:  Small 0.5x0.5 cm flesh colored lesion of L posterior oropharynx.   Cardiovascular: Normal rate, regular rhythm, normal heart sounds and intact distal pulses.   No murmur heard. Pulmonary/Chest: Effort normal and breath sounds normal. No respiratory distress. She has no wheezes. She has no rales.  Abdominal: Soft. Bowel sounds are normal. She exhibits no distension. There is tenderness (mild diffuse). There is no rebound and no guarding.  Skin: Skin is warm and dry. She is not diaphoretic.  Nursing note and vitals reviewed.   ED Course  Procedures (including critical care time) Labs Review Labs Reviewed  URINALYSIS, ROUTINE W REFLEX MICROSCOPIC (NOT AT [facility]) - Abnormal; Notable for the following:    APPearance CLOUDY (*)    Hgb urine dipstick LARGE (*)    Protein,  ur 30 (*)    Nitrite POSITIVE (*)    Leukocytes, UA MODERATE (*)    All other components within normal limits  URINE MICROSCOPIC-ADD ON - Abnormal; Notable for the following:    Squamous Epithelial / LPF 6-30 (*)    Bacteria, UA MANY (*)    All other components within normal limits  POC URINE PREG, ED    Imaging Review No results found. I have personally reviewed and evaluated these images and lab results as part of my medical decision-making.   EKG Interpretation None      MDM   Final diagnoses:  UTI (lower urinary tract infection)   Pt presented with dysuria, found to have UTI with nitrites and leukocytes on UA. Prescribed keflex 250 mg qid x 7 days and pyridium x 2 days for discomfort. Also referred to ENT due  to small lesion of left oropharynx, concerning due to patient's smoking history.  Dani GobbleHillary Fitzgerald, MD [facility], PGY-2   [facility] Hospital[facility] Moen Fitzgerald, MD 07/31/15 [id number]  Blane OharaJoshua Zavitz, MD 08/03/15 0000

## 2015-07-30 NOTE — ED Notes (Signed)
Pt sts UTI sx with dysuria and hematuria starting yesterday

## 2015-07-30 NOTE — Discharge Instructions (Signed)

## 2015-10-10 ENCOUNTER — Encounter (HOSPITAL_COMMUNITY): Payer: Self-pay

## 2015-10-10 ENCOUNTER — Emergency Department (HOSPITAL_COMMUNITY): Payer: Self-pay

## 2015-10-10 ENCOUNTER — Emergency Department (HOSPITAL_COMMUNITY)
Admission: EM | Admit: 2015-10-10 | Discharge: 2015-10-10 | Disposition: A | Discharge status: Home / Self Care | Payer: Self-pay | Attending: Emergency Medicine | Admitting: Emergency Medicine

## 2015-10-10 DIAGNOSIS — F419 Anxiety disorder, unspecified: Secondary | ICD-10-CM

## 2015-10-10 DIAGNOSIS — F17210 Nicotine dependence, cigarettes, uncomplicated: Secondary | ICD-10-CM | POA: Insufficient documentation

## 2015-10-10 DIAGNOSIS — Z7984 Long term (current) use of oral hypoglycemic drugs: Secondary | ICD-10-CM | POA: Insufficient documentation

## 2015-10-10 DIAGNOSIS — Z79899 Other long term (current) drug therapy: Secondary | ICD-10-CM | POA: Insufficient documentation

## 2015-10-10 DIAGNOSIS — R079 Chest pain, unspecified: Secondary | ICD-10-CM

## 2015-10-10 DIAGNOSIS — E119 Type 2 diabetes mellitus without complications: Secondary | ICD-10-CM | POA: Insufficient documentation

## 2015-10-10 DIAGNOSIS — I10 Essential (primary) hypertension: Secondary | ICD-10-CM | POA: Insufficient documentation

## 2015-10-10 DIAGNOSIS — M5442 Lumbago with sciatica, left side: Secondary | ICD-10-CM

## 2015-10-10 DIAGNOSIS — R55 Syncope and collapse: Secondary | ICD-10-CM

## 2015-10-10 LAB — URINALYSIS, ROUTINE W REFLEX MICROSCOPIC
BILIRUBIN URINE: NEGATIVE
Glucose, UA: NEGATIVE mg/dL
Hgb urine dipstick: NEGATIVE
KETONES UR: NEGATIVE mg/dL
Leukocytes, UA: NEGATIVE
NITRITE: NEGATIVE
PROTEIN: NEGATIVE mg/dL
Specific Gravity, Urine: 1.004 — ABNORMAL LOW (ref 1.005–1.030)
pH: 6 (ref 5.0–8.0)

## 2015-10-10 LAB — POC URINE PREG, ED: PREG TEST UR: NEGATIVE

## 2015-10-10 LAB — CBC WITH DIFFERENTIAL/PLATELET
BASOS ABS: 0 10*3/uL (ref 0.0–0.1)
Basophils Relative: 0 %
EOS ABS: 0 10*3/uL (ref 0.0–0.7)
EOS PCT: 0 %
HCT: 43.7 % (ref 36.0–46.0)
HEMOGLOBIN: 14.1 g/dL (ref 12.0–15.0)
Lymphocytes Relative: 24 %
Lymphs Abs: 2.5 10*3/uL (ref 0.7–4.0)
MCH: 28 pg (ref 26.0–34.0)
MCHC: 32.3 g/dL (ref 30.0–36.0)
MCV: 86.7 fL (ref 78.0–100.0)
MONOS PCT: 4 %
Monocytes Absolute: 0.4 10*3/uL (ref 0.1–1.0)
NEUTROS PCT: 72 %
Neutro Abs: 7.3 10*3/uL (ref 1.7–7.7)
PLATELETS: 292 10*3/uL (ref 150–400)
RBC: 5.04 MIL/uL (ref 3.87–5.11)
RDW: 13.8 % (ref 11.5–15.5)
WBC: 10.2 10*3/uL (ref 4.0–10.5)

## 2015-10-10 LAB — I-STAT TROPONIN, ED
TROPONIN I, POC: 0 ng/mL (ref 0.00–0.08)
TROPONIN I, POC: 0 ng/mL (ref 0.00–0.08)

## 2015-10-10 LAB — BASIC METABOLIC PANEL
Anion gap: 8 (ref 5–15)
BUN: 5 mg/dL — ABNORMAL LOW (ref 6–20)
CALCIUM: 9.3 mg/dL (ref 8.9–10.3)
CO2: 26 mmol/L (ref 22–32)
CREATININE: 0.61 mg/dL (ref 0.44–1.00)
Chloride: 107 mmol/L (ref 101–111)
GFR calc non Af Amer: >60 mL/min (ref >60)
Glucose, Bld: 118 mg/dL — ABNORMAL HIGH (ref 65–99)
Potassium: 4.9 mmol/L (ref 3.5–5.1)
Sodium: 141 mmol/L (ref 135–145)

## 2015-10-10 MED ORDER — ONDANSETRON HCL 4 MG/2ML IJ SOLN
4.0000 mg | Freq: Once | INTRAMUSCULAR | Status: AC
  Administered 2015-10-10: 4 mg via INTRAVENOUS
  Filled 2015-10-10: qty 2

## 2015-10-10 MED ORDER — PREDNISONE 20 MG PO TABS: 40.0000 mg | ORAL_TABLET | Freq: Every day | ORAL | 0 refills | Status: DC

## 2015-10-10 MED ORDER — IBUPROFEN 800 MG PO TABS: 800.0000 mg | ORAL_TABLET | Freq: Three times a day (TID) | ORAL | 0 refills | Status: DC

## 2015-10-10 MED ORDER — CYCLOBENZAPRINE HCL 10 MG PO TABS: 10.0000 mg | ORAL_TABLET | Freq: Two times a day (BID) | ORAL | 0 refills | Status: DC | PRN

## 2015-10-10 MED ORDER — HYDROXYZINE HCL 25 MG PO TABS: 25.0000 mg | ORAL_TABLET | Freq: Three times a day (TID) | ORAL | 0 refills | Status: DC | PRN

## 2015-10-10 MED ORDER — KETOROLAC TROMETHAMINE 30 MG/ML IJ SOLN
30.0000 mg | Freq: Once | INTRAMUSCULAR | Status: AC
  Administered 2015-10-10: 30 mg via INTRAVENOUS
  Filled 2015-10-10: qty 1

## 2015-10-10 MED ORDER — MORPHINE SULFATE (PF) 4 MG/ML IV SOLN
4.0000 mg | Freq: Once | INTRAVENOUS | Status: AC
  Administered 2015-10-10: 4 mg via INTRAVENOUS
  Filled 2015-10-10: qty 1

## 2015-10-10 MED ORDER — LORAZEPAM 2 MG/ML IJ SOLN
1.0000 mg | Freq: Once | INTRAMUSCULAR | Status: AC
  Administered 2015-10-10: 1 mg via INTRAVENOUS
  Filled 2015-10-10: qty 1

## 2015-10-10 MED ORDER — PREDNISONE 20 MG PO TABS
60.0000 mg | ORAL_TABLET | Freq: Once | ORAL | Status: AC
  Administered 2015-10-10: 60 mg via ORAL
  Filled 2015-10-10: qty 3

## 2015-10-10 MED ORDER — ASPIRIN 81 MG PO CHEW
324.0000 mg | CHEWABLE_TABLET | Freq: Once | ORAL | Status: AC
  Administered 2015-10-10: 324 mg via ORAL
  Filled 2015-10-10: qty 4

## 2015-10-10 MED ORDER — SODIUM CHLORIDE 0.9 % IV BOLUS (SEPSIS)
1000.0000 mL | Freq: Once | INTRAVENOUS | Status: AC
  Administered 2015-10-10: 1000 mL via INTRAVENOUS

## 2015-10-10 MED ORDER — OXYCODONE-ACETAMINOPHEN 5-325 MG PO TABS: 1.0000 | ORAL_TABLET | ORAL | 0 refills | Status: DC | PRN

## 2015-10-10 NOTE — ED Triage Notes (Signed)
Pt presents with witnessed syncopal episode while at work today.  Pt reports feeling "shaky" getting chest pain and shortness of breath before passing out.  On arrival, pt doubled over expressing need to void, specimen obtained.  Pt reports low back pain and BLE numbness for a few weeks.  Pt endorses weakness.  Pt reports she has been out of her meds for a few months.

## 2015-10-10 NOTE — ED Notes (Signed)
PA informed pt requesting nausea and pain medication.

## 2015-10-10 NOTE — ED Provider Notes (Signed)
[facility]-EMERGENCY DEPT Provider Note   CSN: [id number] Arrival date & time: 10/10/15  1001     History   Chief Complaint Chief Complaint  Patient presents with  . Loss of Consciousness    HPI [name] [name] is a 35 y.o. female.  HPI   Patient is a 35 year old female, current smoker, with history of diabetes, hypertension, depression, anxiety, she presents emergency department for witnessed syncopal episode which happened this morning while at work. Patient states that she was shaky with some central chest pain and shortness of breath prior to loss of consciousness. There is no information available of any seizure-like activity or known down time.  When EMS arrived patient was awake and alert and she is brought to the ER and did report continuing to feel "shaky", feeling anxious with chest pressure. She also reports 3 days of left low back pain that radiates down her left leg which is severe and causing some intermittent numbness and tingling in her legs. She also states that her legs felt like they had bugs crawling deep inside them.  Her back pain is reproducible with movement and palpation.  It is severe rated 10/10, described as burning and shooting.  She denies weakness in lower Shoney's or difficulty ambulating but it does hurt her low back and left buttocks to walk. She believes her chest pressure is related to anxiety. She currently does not short of breath she denies cough, chills, sweats, fever or wheeze. She endorses nausea.  Denies vomiting, diarrhea, abdominal pain, dysuria, hematuria, vaginal symptoms. She is also out of her medications for diabetes, blood pressure and anxiety and depression for the past 3 months.     Past Medical History:  Diagnosis Date  . Anxiety   . Back fracture   . Depression   . Diabetes mellitus without complication (HCC)   . Family history of adverse reaction to anesthesia    sister gets nausea and vomitting and itching on her face. Around her nose  and mouth.  . Fatty liver disease, nonalcoholic   . GERD (gastroesophageal reflux disease)   . H/O cesarean section complicating pregnancy 04/18/2014  . Hypertension   . Neuromuscular disorder (HCC)    eye twitch on left eye   . Obesity   . Pelvis fracture (HCC)   . S/P cesarean section 06/01/2014  . S/P tubal ligation 06/01/2014    Patient Active Problem List   Diagnosis Date Noted  . S/P cesarean section 06/01/2014  . S/P tubal ligation 06/01/2014  . H/O cesarean section complicating pregnancy 04/18/2014  . Anxiety 03/16/2013  . Smoking 03/16/2013  . Acute chest pain 03/01/2013    Past Surgical History:  Procedure Laterality Date  . CESAREAN SECTION    . CESAREAN SECTION WITH BILATERAL TUBAL LIGATION Bilateral 06/01/2014   Procedure: CESAREAN SECTION WITH BILATERAL TUBAL LIGATION;  Surgeon: Sherian ReinJody Bovard-Stuckert, MD;  Location: [facility];  Service: Obstetrics;  Laterality: Bilateral;  MD requests RNFA  . CHEST TUBE INSERTION    . TONSILLECTOMY    . TRACHEOSTOMY      OB History    Gravida Para Term Preterm AB Living   2 2 2     2    SAB TAB Ectopic Multiple Live Births         0 2       Home Medications    Prior to Admission medications   Medication Sig Start Date End Date Taking? Authorizing Provider  oxyCODONE-acetaminophen (PERCOCET/ROXICET) 5-325 MG per tablet Take 1-2  tablets by mouth every 6 (six) hours as needed (for pain scale greater than 7). 06/03/14  Yes Jody Bovard-Stuckert, MD  cyclobenzaprine (FLEXERIL) 10 MG tablet Take 1 tablet (10 mg total) by mouth 2 (two) times daily as needed for muscle spasms. 10/10/15   Danelle Berry, PA-C  hydrOXYzine (ATARAX/VISTARIL) 25 MG tablet Take 1 tablet (25 mg total) by mouth every 8 (eight) hours as needed for anxiety. 10/10/15   Danelle Berry, PA-C  ibuprofen (ADVIL,MOTRIN) 800 MG tablet Take 1 tablet (800 mg total) by mouth 3 (three) times daily. 10/10/15   Danelle Berry, PA-C  metFORMIN (GLUCOPHAGE) 500 MG tablet Take 1 tablet (500  mg total) by mouth 2 (two) times daily with a meal. Patient not taking: Reported on 10/10/2015 06/03/14   Sherian Rein, MD  oxyCODONE-acetaminophen (PERCOCET) 5-325 MG tablet Take 1 tablet by mouth every 4 (four) hours as needed. 10/10/15   Danelle Berry, PA-C  predniSONE (DELTASONE) 20 MG tablet Take 2 tablets (40 mg total) by mouth daily. 10/10/15   Danelle Berry, PA-C  triamcinolone cream (KENALOG) 0.1 % Apply 1 application topically 2 (two) times daily. Patient not taking: Reported on 10/10/2015 05/08/15   Hayden Rasmussen, NP    Family History Family History  Problem Relation Age of Onset  . Diabetes Maternal Grandmother   . Diabetes Paternal Grandmother     Social History Social History  Substance Use Topics  . Smoking status: Current Every Day Smoker    Packs/day: 1.00    Years: 12.00    Types: Cigarettes  . Smokeless tobacco: Never Used  . Alcohol use No     Allergies   Review of patient's allergies indicates no known allergies.   Review of Systems Review of Systems  All other systems reviewed and are negative.    Physical Exam Updated Vital Signs BP 119/73   Pulse 60   Temp 98.2 F (36.8 C) (Oral)   Resp 20   Ht 5\' 1"  (1.549 m)   Wt 104.3 kg   LMP 09/27/2015 (Approximate)   SpO2 96%   BMI 43.46 kg/m   Physical Exam  Constitutional: She is oriented to person, place, and time. She appears well-developed and well-nourished. No distress.  Well appearing female, nontoxic in appearance, intermittently tearful, NAD  HENT:  Head: Normocephalic and atraumatic.  Right Ear: External ear normal.  Left Ear: External ear normal.  Nose: Nose normal.  Mouth/Throat: Oropharynx is clear and moist.  Eyes: Conjunctivae and EOM are normal. Pupils are equal, round, and reactive to light. Right eye exhibits no discharge. Left eye exhibits no discharge. No scleral icterus.  Neck: Normal range of motion. Neck supple. No JVD present. No tracheal deviation present.    Cardiovascular: Normal rate, regular rhythm, normal heart sounds and intact distal pulses.  Exam reveals no gallop and no friction rub.   No murmur heard. No lower extremity edema, symmetrical pulses, radial 2+, dorsal pedis and posterior tibialis 2+  Pulmonary/Chest: Effort normal and breath sounds normal. No stridor. No respiratory distress. She has no wheezes. She has no rales. She exhibits no tenderness.  Abdominal: Soft. Normal appearance and bowel sounds are normal. She exhibits no distension and no mass. There is no tenderness. There is no rigidity, no rebound, no guarding and no CVA tenderness.  Musculoskeletal: Normal range of motion. She exhibits no edema.       Thoracic back: She exhibits tenderness. She exhibits normal range of motion, no bony tenderness and no spasm.  Lumbar back: She exhibits tenderness. She exhibits normal range of motion, no bony tenderness and no spasm.       Back:  Lymphadenopathy:    She has no cervical adenopathy.  Neurological: She is alert and oriented to person, place, and time. She exhibits normal muscle tone. Coordination normal.  Normal sensation to light touch in all extremities, 5/5 strength bilaterally dorsiflexion and plantarflexion  Skin: Skin is warm and dry. Capillary refill takes less than 2 seconds. No rash noted. She is not diaphoretic. No erythema. No pallor.  Psychiatric: She has a normal mood and affect. Her behavior is normal. Judgment and thought content normal.  Nursing note and vitals reviewed.    ED Treatments / Results  Labs (all labs ordered are listed, but only abnormal results are displayed) Labs Reviewed  BASIC METABOLIC PANEL - Abnormal; Notable for the following:       Result Value   Glucose, Bld 118 (*)    BUN 5 (*)    All other components within normal limits  URINALYSIS, ROUTINE W REFLEX MICROSCOPIC (NOT AT [facility]) - Abnormal; Notable for the following:    Specific Gravity, Urine 1.004 (*)    All other  components within normal limits  CBC WITH DIFFERENTIAL/PLATELET  Rosezena Sensor, ED  POC URINE PREG, ED  I-STAT TROPOININ, ED    EKG  EKG Interpretation  Date/Time:  Thursday October 10 2015 10:14:33 EDT Ventricular Rate:  71 PR Interval:    QRS Duration: 71 QT Interval:  381 QTC Calculation: 414 R Axis:   56 Text Interpretation:  Sinus rhythm Normal ECG Unchanged from 02/28/2013 Confirmed by DELO  MD, DOUGLAS ([id number]) on 10/10/2015 10:51:07 AM       Radiology Dg Chest 2 View  Result Date: 10/10/2015 CLINICAL DATA:  Syncopal episode today.  Chest pressure. EXAM: CHEST  2 VIEW COMPARISON:  02/28/2013 FINDINGS: The heart size and mediastinal contours are within normal limits. Both lungs are clear. The visualized skeletal structures are unremarkable. IMPRESSION: Negative.  No active cardiopulmonary disease. Electronically Signed   By: Myles Rosenthal M.D.   On: 10/10/2015 13:08    Procedures Procedures (including critical care time)  Medications Ordered in ED Medications  sodium chloride 0.9 % bolus 1,000 mL (0 mLs Intravenous Stopped 10/10/15 1431)  ondansetron (ZOFRAN) injection 4 mg (4 mg Intravenous Given 10/10/15 1220)  morphine 4 MG/ML injection 4 mg (4 mg Intravenous Given 10/10/15 1220)  LORazepam (ATIVAN) injection 1 mg (1 mg Intravenous Given 10/10/15 1220)  aspirin chewable tablet 324 mg (324 mg Oral Given 10/10/15 1219)  ketorolac (TORADOL) 30 MG/ML injection 30 mg (30 mg Intravenous Given 10/10/15 1430)  predniSONE (DELTASONE) tablet 60 mg (60 mg Oral Given 10/10/15 1430)     Initial Impression / Assessment and Plan / ED Course  I have reviewed the triage vital signs and the nursing notes.  Pertinent labs & imaging results that were available during my care of the patient were reviewed by me and considered in my medical decision making (see chart for details).  Clinical Course   Pt with syncopal episode, had "shakiness" CP and SOB prior to LOC.  No head injury,  unknown down time.  She is a current smoker, history of diabetes hypertension, she states she is currently on medications because her insurance.  Syncope/CP work up.  Troponin was negative 2, chest x-ray negative.  EKG normal sinus rhythm. Her chest pain was relieved with anxiolytics.   She does have low left back pain.  No neurological deficits and normal neuro exam.  Patient can walk but states is painful.  No loss of bowel or bladder control.  No concern for cauda equina.  No fever, night sweats, weight loss, h/o cancer, IVDU.  RICE protocol and pain medicine indicated and discussed with patient.   Did not refill her medications, she is only on metformin in the chart and blood pressure and blood sugars were normal, outpt can eval and determine need to restart medications. Case management consulted routinely to help call her and establish a follow-up visit. She was given cardiology referral for follow-up of syncope.  Given vistaril for anxiety.  She was discharged home in good condition, stable vital signs   Final Clinical Impressions(s) / ED Diagnoses   Final diagnoses:  Syncope and collapse  Chest pain, unspecified chest pain type  Anxiety  Left-sided low back pain with left-sided sciatica    New Prescriptions Discharge Medication List as of 10/10/2015  3:48 PM    START taking these medications   Details  cyclobenzaprine (FLEXERIL) 10 MG tablet Take 1 tablet (10 mg total) by mouth 2 (two) times daily as needed for muscle spasms., Starting Thu 10/10/2015, Print    hydrOXYzine (ATARAX/VISTARIL) 25 MG tablet Take 1 tablet (25 mg total) by mouth every 8 (eight) hours as needed for anxiety., Starting Thu 10/10/2015, Print    ibuprofen (ADVIL,MOTRIN) 800 MG tablet Take 1 tablet (800 mg total) by mouth 3 (three) times daily., Starting Thu 10/10/2015, Print    !! oxyCODONE-acetaminophen (PERCOCET) 5-325 MG tablet Take 1 tablet by mouth every 4 (four) hours as needed., Starting Thu  10/10/2015, Print    predniSONE (DELTASONE) 20 MG tablet Take 2 tablets (40 mg total) by mouth daily., Starting Thu 10/10/2015, Print     !! - Potential duplicate medications found. Please discuss with provider.       Danelle Berry, PA-C 10/10/15 1639    Geoffery Lyons, MD 10/11/15 [phone]

## 2015-10-26 IMAGING — CR DG CHEST 2V
2 series · 2 of 2 positions shown · non-contrast
Comparison: None.

CLINICAL DATA: Chest pain, shortness of breath.

EXAM:
CHEST  2 VIEW

[w chest pa]
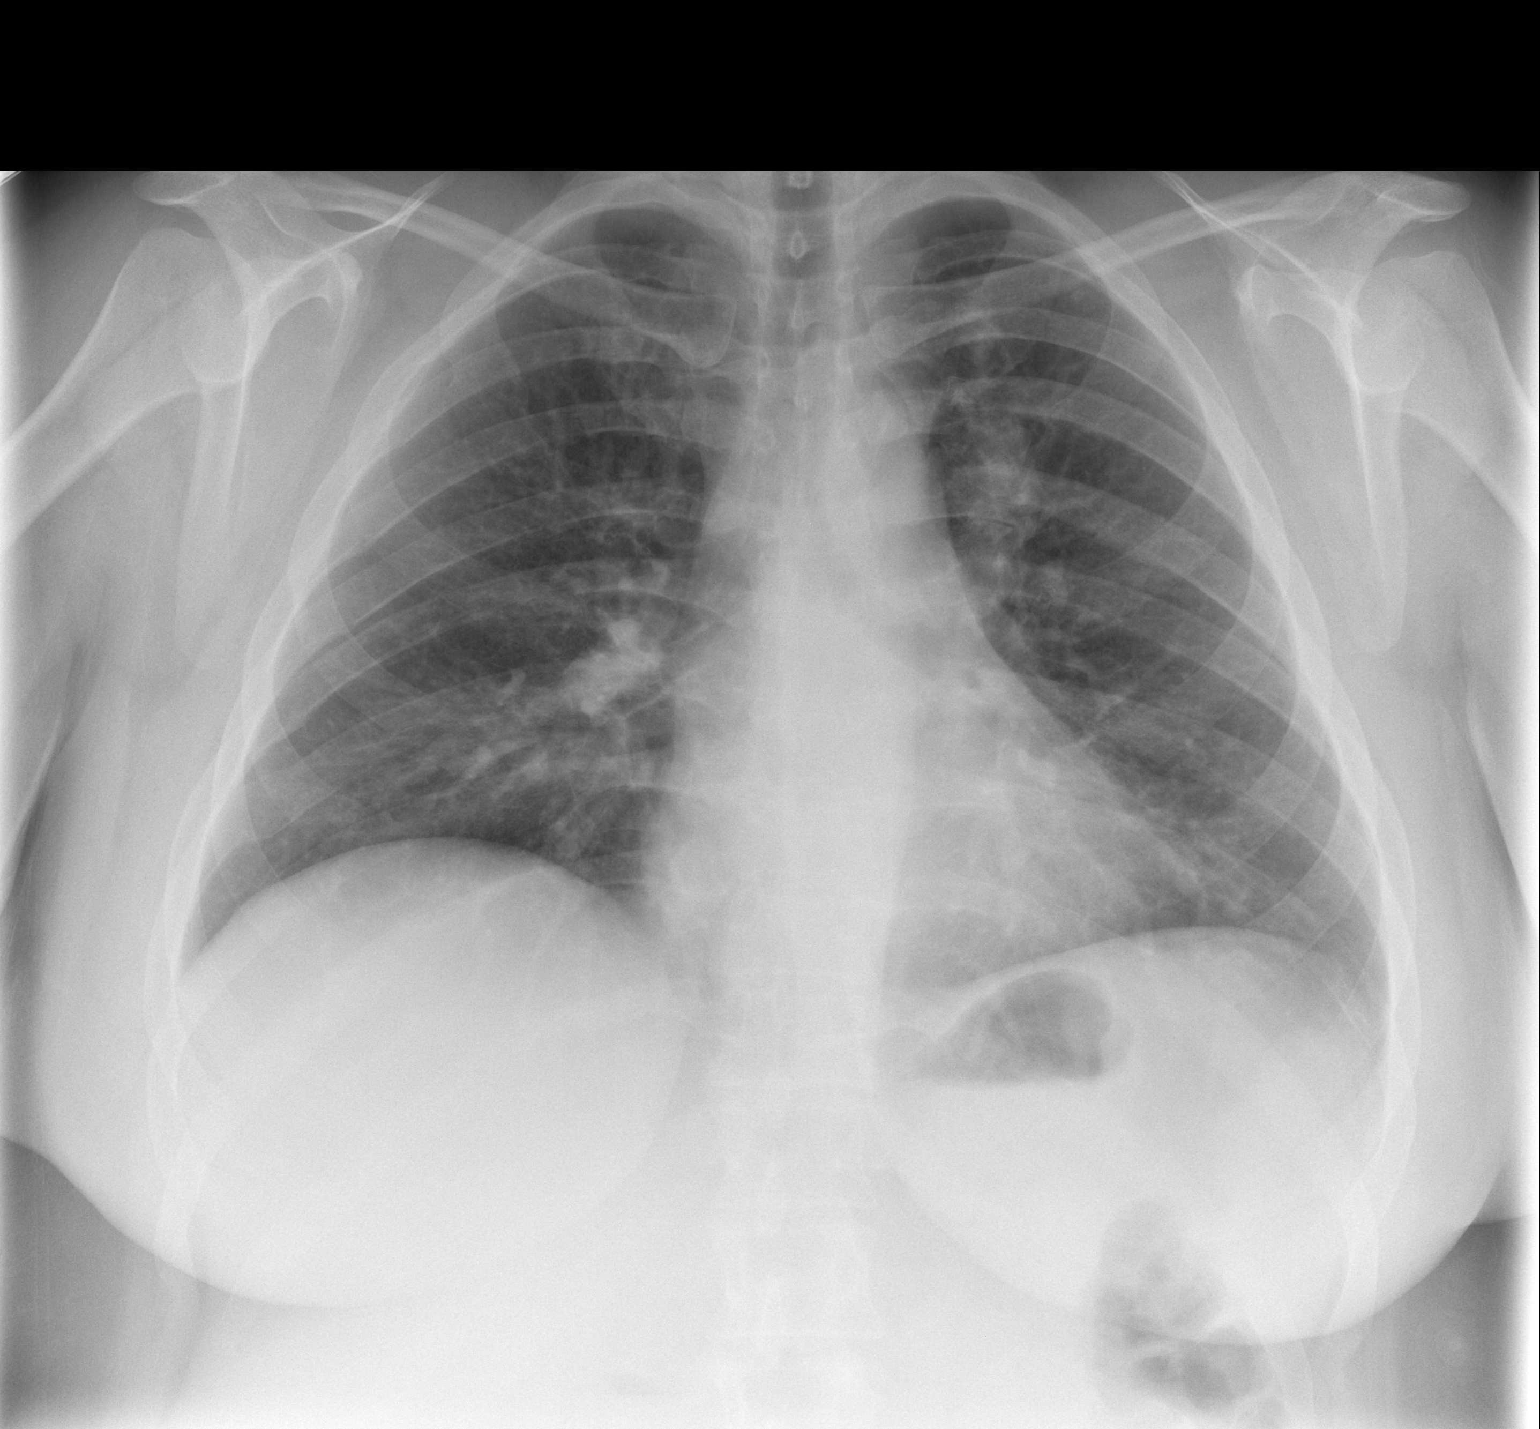

[w chest lat]
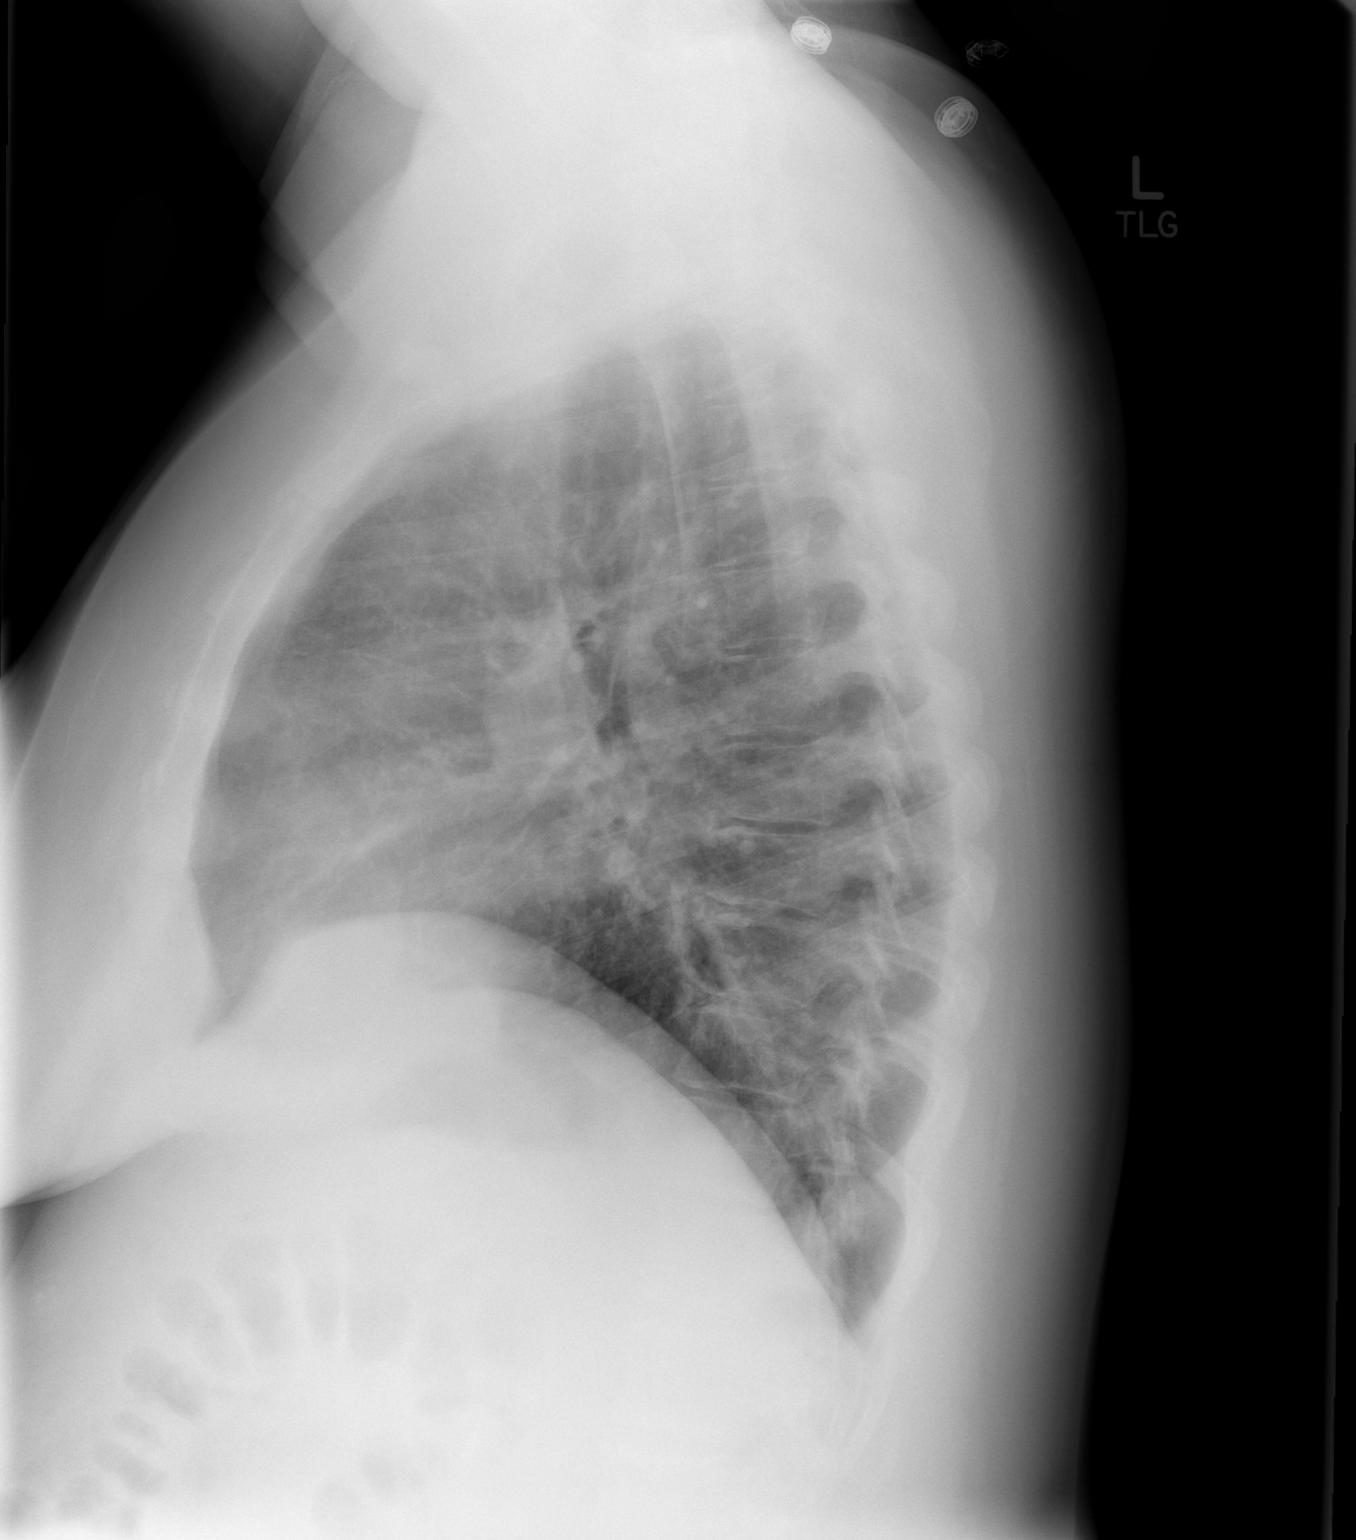

[2 of 2 positions shown; findings below may reference images not displayed]

FINDINGS: The heart size and mediastinal contours are within normal limits.
Both lungs are clear. No pleural effusion or pneumothorax is noted.
The visualized skeletal structures are unremarkable.
IMPRESSION: No active cardiopulmonary disease.

## 2015-12-19 ENCOUNTER — Emergency Department (HOSPITAL_COMMUNITY)
Admission: EM | Admit: 2015-12-19 | Discharge: 2015-12-19 | Disposition: A | Discharge status: Home / Self Care | Payer: Medicaid Other | Attending: Emergency Medicine | Admitting: Emergency Medicine

## 2015-12-19 ENCOUNTER — Encounter (HOSPITAL_COMMUNITY): Payer: Self-pay | Admitting: *Deleted

## 2015-12-19 DIAGNOSIS — J069 Acute upper respiratory infection, unspecified: Secondary | ICD-10-CM | POA: Insufficient documentation

## 2015-12-19 DIAGNOSIS — B9789 Other viral agents as the cause of diseases classified elsewhere: Secondary | ICD-10-CM

## 2015-12-19 DIAGNOSIS — E119 Type 2 diabetes mellitus without complications: Secondary | ICD-10-CM | POA: Insufficient documentation

## 2015-12-19 DIAGNOSIS — I10 Essential (primary) hypertension: Secondary | ICD-10-CM | POA: Insufficient documentation

## 2015-12-19 DIAGNOSIS — F17210 Nicotine dependence, cigarettes, uncomplicated: Secondary | ICD-10-CM | POA: Insufficient documentation

## 2015-12-19 DIAGNOSIS — Z79899 Other long term (current) drug therapy: Secondary | ICD-10-CM | POA: Insufficient documentation

## 2015-12-19 DIAGNOSIS — Z7984 Long term (current) use of oral hypoglycemic drugs: Secondary | ICD-10-CM | POA: Insufficient documentation

## 2015-12-19 MED ORDER — PREDNISONE 20 MG PO TABS: 40.0000 mg | ORAL_TABLET | Freq: Every day | ORAL | 0 refills | Status: DC

## 2015-12-19 MED ORDER — ALBUTEROL SULFATE HFA 108 (90 BASE) MCG/ACT IN AERS
2.0000 | INHALATION_SPRAY | Freq: Four times a day (QID) | RESPIRATORY_TRACT | Status: DC
  Administered 2015-12-19: 2 via RESPIRATORY_TRACT
  Filled 2015-12-19: qty 6.7

## 2015-12-19 MED ORDER — PREDNISONE 20 MG PO TABS
60.0000 mg | ORAL_TABLET | ORAL | Status: AC
  Administered 2015-12-19: 60 mg via ORAL
  Filled 2015-12-19: qty 3

## 2015-12-19 MED ORDER — PSEUDOEPHEDRINE HCL ER 120 MG PO TB12: 120.0000 mg | ORAL_TABLET | Freq: Two times a day (BID) | ORAL | 0 refills | Status: DC

## 2015-12-19 MED ORDER — PSEUDOEPHEDRINE HCL 30 MG PO TABS
30.0000 mg | ORAL_TABLET | Freq: Once | ORAL | Status: AC
  Administered 2015-12-19: 30 mg via ORAL
  Filled 2015-12-19: qty 1

## 2015-12-19 MED ORDER — METFORMIN HCL 500 MG PO TABS: 500.0000 mg | ORAL_TABLET | Freq: Two times a day (BID) | ORAL | 0 refills | Status: DC

## 2015-12-19 MED ORDER — LISINOPRIL 20 MG PO TABS: 20.0000 mg | ORAL_TABLET | Freq: Every day | ORAL | 0 refills | Status: DC

## 2015-12-19 NOTE — ED Triage Notes (Signed)
Pt states sinus congestion, cough, sore throat x 2 weeks that is not improving.

## 2015-12-19 NOTE — Discharge Instructions (Signed)
As discussed, today's evaluation has been largely reassuring.  However, with your upper respiratory illness this point to take all medication as directed, specifically, prednisone and Sudafed.  In addition, please use the provided albuterol inhaler every 4 hours for the next 2 days, then as needed.  Equally important is that you obtain assistance with smoking cessation and find a primary care physician.  Return here for concerning changes in your condition.

## 2015-12-19 NOTE — ED Provider Notes (Signed)
[facility]-EMERGENCY DEPT Provider Note   CSN: [id number] Arrival date & time: 12/19/15  1230  By signing my name below, I, Freida Busmaniana Omoyeni, attest that this documentation has been prepared under the direction and in the presence of Gerhard Munchobert Braelyn Bordonaro, MD . Electronically Signed: Freida Busmaniana Omoyeni, Scribe. 12/19/2015. 1:04 PM.  History   Chief Complaint Chief Complaint  Patient presents with  . URI    The history is provided by the patient. No language interpreter was used.     HPI Comments:  [name] [name] is a 35 y.o. female who presents to the Emergency Department complaining of nasal congestion x 2 weeks. She reports associated worsening cough, sore throat, HA, and CP secondary to cough. She also notes chest congestion x 3 days. No fever. She has taken dayquil and nyquil without relief. Pt is a current smoker   Pt has a h/o  DM  and HTN and  is not compliant with her meds. She states she ran out and does not have a PCP to follow up with. She takes 20mg  lisinopril BID and Metformin ER 500mg  3 times daily.     Past Medical History:  Diagnosis Date  . Anxiety   . Back fracture   . Depression   . Diabetes mellitus without complication (HCC)   . Family history of adverse reaction to anesthesia    sister gets nausea and vomitting and itching on her face. Around her nose and mouth.  . Fatty liver disease, nonalcoholic   . GERD (gastroesophageal reflux disease)   . H/O cesarean section complicating pregnancy 04/18/2014  . Hypertension   . Neuromuscular disorder (HCC)    eye twitch on left eye   . Obesity   . Pelvis fracture (HCC)   . S/P cesarean section 06/01/2014  . S/P tubal ligation 06/01/2014    Patient Active Problem List   Diagnosis Date Noted  . S/P cesarean section 06/01/2014  . S/P tubal ligation 06/01/2014  . H/O cesarean section complicating pregnancy 04/18/2014  . Anxiety 03/16/2013  . Smoking 03/16/2013  . Acute chest pain 03/01/2013    Past Surgical History:    Procedure Laterality Date  . CESAREAN SECTION    . CESAREAN SECTION WITH BILATERAL TUBAL LIGATION Bilateral 06/01/2014   Procedure: CESAREAN SECTION WITH BILATERAL TUBAL LIGATION;  Surgeon: Sherian ReinJody Bovard-Stuckert, MD;  Location: [facility];  Service: Obstetrics;  Laterality: Bilateral;  MD requests RNFA  . CHEST TUBE INSERTION    . TONSILLECTOMY    . TRACHEOSTOMY      OB History    Gravida Para Term Preterm AB Living   2 2 2     2    SAB TAB Ectopic Multiple Live Births         0 2       Home Medications    Prior to Admission medications   Medication Sig Start Date End Date Taking? Authorizing Provider  cyclobenzaprine (FLEXERIL) 10 MG tablet Take 1 tablet (10 mg total) by mouth 2 (two) times daily as needed for muscle spasms. 10/10/15   Danelle BerryLeisa Tapia, PA-C  hydrOXYzine (ATARAX/VISTARIL) 25 MG tablet Take 1 tablet (25 mg total) by mouth every 8 (eight) hours as needed for anxiety. 10/10/15   Danelle BerryLeisa Tapia, PA-C  ibuprofen (ADVIL,MOTRIN) 800 MG tablet Take 1 tablet (800 mg total) by mouth 3 (three) times daily. 10/10/15   Danelle BerryLeisa Tapia, PA-C  metFORMIN (GLUCOPHAGE) 500 MG tablet Take 1 tablet (500 mg total) by mouth 2 (two) times daily with a meal.  Patient not taking: Reported on 10/10/2015 06/03/14   Sherian ReinJody Bovard-Stuckert, MD  oxyCODONE-acetaminophen (PERCOCET) 5-325 MG tablet Take 1 tablet by mouth every 4 (four) hours as needed. 10/10/15   Danelle BerryLeisa Tapia, PA-C  oxyCODONE-acetaminophen (PERCOCET/ROXICET) 5-325 MG per tablet Take 1-2 tablets by mouth every 6 (six) hours as needed (for pain scale greater than 7). 06/03/14   Augusto GambleJody Bovard-Stuckert, MD  predniSONE (DELTASONE) 20 MG tablet Take 2 tablets (40 mg total) by mouth daily. 10/10/15   Danelle BerryLeisa Tapia, PA-C  triamcinolone cream (KENALOG) 0.1 % Apply 1 application topically 2 (two) times daily. Patient not taking: Reported on 10/10/2015 05/08/15   Hayden Rasmussenavid Mabe, NP    Family History Family History  Problem Relation Age of Onset  . Diabetes Maternal Grandmother    . Diabetes Paternal Grandmother     Social History Social History  Substance Use Topics  . Smoking status: Current Every Day Smoker    Packs/day: 1.00    Years: 12.00    Types: Cigarettes  . Smokeless tobacco: Never Used  . Alcohol use No     Allergies   Patient has no known allergies.   Review of Systems Review of Systems  Constitutional:       Per HPI, otherwise negative  HENT:       Per HPI, otherwise negative  Respiratory:       Per HPI, otherwise negative  Cardiovascular:       Per HPI, otherwise negative  Gastrointestinal: Negative for vomiting.  Endocrine:       Negative aside from HPI  Genitourinary:       Neg aside from HPI   Musculoskeletal:       Per HPI, otherwise negative  Skin: Negative.   Neurological: Negative for syncope.     Physical Exam Updated Vital Signs BP (!) 166/101 (BP Location: Right Arm)   Pulse 90   Temp 97.7 F (36.5 C) (Oral)   Resp 20   Ht 5\' 1"  (1.549 m)   Wt 250 lb (113.4 kg)   LMP 12/09/2015   SpO2 99%   BMI 47.24 kg/m   Physical Exam  Constitutional: She is oriented to person, place, and time. She appears well-developed and well-nourished. No distress.  HENT:  Head: Normocephalic and atraumatic.  Mouth/Throat: Oropharynx is clear and moist.  Eyes: Conjunctivae and EOM are normal.  Cardiovascular: Normal rate and regular rhythm.   Pulmonary/Chest: Effort normal and breath sounds normal. No stridor. No respiratory distress. She has no wheezes.  Slightly diminished bilaterally   Abdominal: She exhibits no distension.  Musculoskeletal: She exhibits no edema.  Lymphadenopathy:    She has no cervical adenopathy.  Neurological: She is alert and oriented to person, place, and time. No cranial nerve deficit.  Skin: Skin is warm and dry.  Psychiatric: She has a normal mood and affect.  Nursing note and vitals reviewed.    ED Treatments / Results  DIAGNOSTIC STUDIES:  Oxygen Saturation is 99% on RA, normal by my  interpretation.    COORDINATION OF CARE:  1:01 PM The patient was counseled on the dangers of tobacco abuse and advised to quit smoking. I spent 5 minutes in counseling of smoking cessation strategies. Discussed discharge plan with pt at bedside and pt agreed to plan.     Procedures Procedures (including critical care time)  Medications Ordered in ED Medications - No data to display   Initial Impression / Assessment and Plan / ED Course  I have reviewed the triage vital  signs and the nursing notes.  Pertinent labs & imaging results that were available during my care of the patient were reviewed by me and considered in my medical decision making (see chart for details).  Clinical Course     Generally well-appearing patient presents with 2 weeks of ongoing upper respiratory infection like symptoms. Here no evidence for pneumonia, bacteremia, sepsis, patient is awake and alert, with clear posterior oropharynx. Patient does smoke, and this is likely contributing to her prolonged course of illness. Patient started on a course of decongestant, steroids, bronchodilator, will follow-up with primary care.  Final Clinical Impressions(s) / ED Diagnoses   Final diagnoses:  Viral URI with cough    I personally performed the services described in this documentation, which was scribed in my presence. The recorded information has been reviewed and is accurate.       Gerhard Munch, MD 12/19/15 [id number]

## 2015-12-19 NOTE — ED Notes (Signed)
Declined W/C at D/C and was escorted to lobby by RN. 

## 2015-12-19 NOTE — Discharge Planning (Signed)
Pt up for discharge. EDCM reviewed chart for possible CM needs.  No needs identified or communicated.  

## 2015-12-19 NOTE — ED Notes (Signed)
Pt giving UA in RR 10

## 2017-06-17 ENCOUNTER — Emergency Department (HOSPITAL_COMMUNITY)
Admission: EM | Admit: 2017-06-17 | Discharge: 2017-06-17 | Disposition: A | Discharge status: Home / Self Care | Payer: Self-pay | Attending: Emergency Medicine | Admitting: Emergency Medicine

## 2017-06-17 ENCOUNTER — Encounter (HOSPITAL_COMMUNITY): Payer: Self-pay | Admitting: Emergency Medicine

## 2017-06-17 DIAGNOSIS — I10 Essential (primary) hypertension: Secondary | ICD-10-CM | POA: Insufficient documentation

## 2017-06-17 DIAGNOSIS — F17210 Nicotine dependence, cigarettes, uncomplicated: Secondary | ICD-10-CM | POA: Insufficient documentation

## 2017-06-17 DIAGNOSIS — Z7984 Long term (current) use of oral hypoglycemic drugs: Secondary | ICD-10-CM | POA: Insufficient documentation

## 2017-06-17 DIAGNOSIS — L03116 Cellulitis of left lower limb: Secondary | ICD-10-CM | POA: Insufficient documentation

## 2017-06-17 DIAGNOSIS — W57XXXA Bitten or stung by nonvenomous insect and other nonvenomous arthropods, initial encounter: Secondary | ICD-10-CM | POA: Insufficient documentation

## 2017-06-17 DIAGNOSIS — Z79899 Other long term (current) drug therapy: Secondary | ICD-10-CM | POA: Insufficient documentation

## 2017-06-17 DIAGNOSIS — E119 Type 2 diabetes mellitus without complications: Secondary | ICD-10-CM | POA: Insufficient documentation

## 2017-06-17 MED ORDER — DOXYCYCLINE HYCLATE 100 MG PO CAPS: 100.0000 mg | ORAL_CAPSULE | Freq: Two times a day (BID) | ORAL | 0 refills | Status: DC

## 2017-06-17 NOTE — ED Provider Notes (Signed)
`  Patient placed in Quick Look pathway, seen and evaluated   Chief Complaint: tick pbite, left ankle erythema, pain and swelling  HPI:   Pt reports tick bite on left ankle about 2 weeks ago, pt removed tick. 1 week ago developed red rash over area of tick bite, which has been getting progressively larger since. She reports localized pain over ankle. Denies and fevers, chills or generalized malaise.  ROS: + rash, ankle pain & swelling, - fevers, chills, generalized malaise  Physical Exam:   Gen: No distress  Neuro: Awake and Alert  Skin: Warm    Focused Exam: 4 x 3 cm erythematous patch over the left lateral ankle with mild surrounding swelling, 2+ DP pulse, pt able to dorsiflex and plantar flex   Initiation of care has begun. The patient has been counseled on the process, plan, and necessity for staying for the completion/evaluation, and the remainder of the medical screening examination    Legrand RamsFord, Avish Torry N, PA-C 06/17/17 [id number]    Gwyneth SproutPlunkett, Whitney, MD 06/19/17 [phone]

## 2017-06-17 NOTE — ED Notes (Signed)
Pt verbalizes understanding of d/c instructions. Pt received prescriptions. Pt ambulatory at d/c with all belongings.  

## 2017-06-17 NOTE — ED Provider Notes (Signed)
[facility] EMERGENCY DEPARTMENT Provider Note   CSN: [id number] Arrival date & time: 06/17/17  1421     History   Chief Complaint Chief Complaint  Patient presents with  . Insect Bite    HPI [name] [name] is a 37 y.o. female.  HPI   Patient is a 37 year old female with a history of type 2 diabetes, hypertension, GERD who presents to the emergency department for evaluation of tick bite.  Patient states that she pulled a small tick out of her left lateral ankle about 2 weeks ago.  States she was able to completely remove the tick.  Reports that about a week later she noticed redness and swelling over where the tick had bit her.  States that the redness and swelling has increased in size over the past 4 days.  She reports burning pain overlying.  Worsened with moving the ankle. Pain is constant and about 7/10 in severity. She has tried soaking the foot in warm water, using ice packs, applying cortisone cream and also Neosporin without improvement.  She denies fevers, chills, myalgias, arthralgias, headache, rash elsewhere.  Known else at home has similar rash.  Past Medical History:  Diagnosis Date  . Anxiety   . Back fracture   . Depression   . Diabetes mellitus without complication (HCC)   . Family history of adverse reaction to anesthesia    sister gets nausea and vomitting and itching on her face. Around her nose and mouth.  . Fatty liver disease, nonalcoholic   . GERD (gastroesophageal reflux disease)   . H/O cesarean section complicating pregnancy 04/18/2014  . Hypertension   . Neuromuscular disorder (HCC)    eye twitch on left eye   . Obesity   . Pelvis fracture (HCC)   . S/P cesarean section 06/01/2014  . S/P tubal ligation 06/01/2014    Patient Active Problem List   Diagnosis Date Noted  . S/P cesarean section 06/01/2014  . S/P tubal ligation 06/01/2014  . H/O cesarean section complicating pregnancy 04/18/2014  . Anxiety 03/16/2013  . Smoking  03/16/2013  . Acute chest pain 03/01/2013    Past Surgical History:  Procedure Laterality Date  . CESAREAN SECTION    . CESAREAN SECTION WITH BILATERAL TUBAL LIGATION Bilateral 06/01/2014   Procedure: CESAREAN SECTION WITH BILATERAL TUBAL LIGATION;  Surgeon: Sherian Rein, MD;  Location: [facility];  Service: Obstetrics;  Laterality: Bilateral;  MD requests RNFA  . CHEST TUBE INSERTION    . TONSILLECTOMY    . TRACHEOSTOMY       OB History    Gravida  2   Para  2   Term  2   Preterm      AB      Living  2     SAB      TAB      Ectopic      Multiple  0   Live Births  2            Home Medications    Prior to Admission medications   Medication Sig Start Date End Date Taking? Authorizing Provider  cyclobenzaprine (FLEXERIL) 10 MG tablet Take 1 tablet (10 mg total) by mouth 2 (two) times daily as needed for muscle spasms. 10/10/15   Danelle Berry, PA-C  hydrOXYzine (ATARAX/VISTARIL) 25 MG tablet Take 1 tablet (25 mg total) by mouth every 8 (eight) hours as needed for anxiety. 10/10/15   Danelle Berry, PA-C  ibuprofen (ADVIL,MOTRIN) 800 MG tablet  Take 1 tablet (800 mg total) by mouth 3 (three) times daily. 10/10/15   Danelle Berry, PA-C  lisinopril (PRINIVIL,ZESTRIL) 20 MG tablet Take 1 tablet (20 mg total) by mouth daily. 12/19/15   Gerhard Munch, MD  metFORMIN (GLUCOPHAGE) 500 MG tablet Take 1 tablet (500 mg total) by mouth 2 (two) times daily with a meal. 12/19/15   Gerhard Munch, MD  oxyCODONE-acetaminophen (PERCOCET) 5-325 MG tablet Take 1 tablet by mouth every 4 (four) hours as needed. 10/10/15   Danelle Berry, PA-C  oxyCODONE-acetaminophen (PERCOCET/ROXICET) 5-325 MG per tablet Take 1-2 tablets by mouth every 6 (six) hours as needed (for pain scale greater than 7). 06/03/14   Bovard-Stuckert, Augusto Gamble, MD  predniSONE (DELTASONE) 20 MG tablet Take 2 tablets (40 mg total) by mouth daily with breakfast. For the next four days 12/19/15   Gerhard Munch, MD    pseudoephedrine (SUDAFED 12 HOUR) 120 MG 12 hr tablet Take 1 tablet (120 mg total) by mouth 2 (two) times daily. Use twice daily for five days 12/19/15   Gerhard Munch, MD  triamcinolone cream (KENALOG) 0.1 % Apply 1 application topically 2 (two) times daily. Patient not taking: Reported on 10/10/2015 05/08/15   Hayden Rasmussen, NP    Family History Family History  Problem Relation Age of Onset  . Diabetes Maternal Grandmother   . Diabetes Paternal Grandmother     Social History Social History   Tobacco Use  . Smoking status: Current Every Day Smoker    Packs/day: 1.00    Years: 12.00    Pack years: 12.00    Types: Cigarettes  . Smokeless tobacco: Never Used  Substance Use Topics  . Alcohol use: No  . Drug use: No     Allergies   Patient has no known allergies.   Review of Systems Review of Systems  Constitutional: Negative for chills and fever.  Gastrointestinal: Negative for abdominal pain, nausea and vomiting.  Musculoskeletal: Negative for arthralgias and myalgias.  Skin: Positive for color change and rash.  Neurological: Negative for weakness, numbness and headaches.  Psychiatric/Behavioral: Negative for agitation.     Physical Exam Updated Vital Signs BP (!) 160/104 (BP Location: Right Arm)   Pulse 73   Temp 98.2 F (36.8 C) (Oral)   Resp 16   LMP 03/30/2017   SpO2 100%   Physical Exam  Constitutional: She appears well-developed and well-nourished. No distress.  HENT:  Head: Normocephalic and atraumatic.  Eyes: Right eye exhibits no discharge. Left eye exhibits no discharge.  Pulmonary/Chest: Effort normal. No respiratory distress.  Musculoskeletal:  Left lateral ankle with 3xm x 4cm erythematous patch which is tender and warm to the touch. Mild swelling surrounding. No drainage, fluctuance or break in skin. Full active ROM of the ankle. DP pulses 2+ bilaterally. Sensation to light touch intact in distal left lower extremity.   Neurological: She is  alert. Coordination normal.  Skin: Skin is warm and dry. Capillary refill takes less than 2 seconds. She is not diaphoretic.  Psychiatric: She has a normal mood and affect. Her behavior is normal.  Nursing note and vitals reviewed.    ED Treatments / Results  Labs (all labs ordered are listed, but only abnormal results are displayed) Labs Reviewed  ROCKY MTN SPOTTED FVR ABS PNL(IGG+IGM)  EHRLICHIA ANTIBODY PANEL  B. BURGDORFI ANTIBODIES    EKG None  Radiology No results found.  Procedures Procedures (including critical care time)  Medications Ordered in ED Medications - No data to display  Initial Impression / Assessment and Plan / ED Course  I have reviewed the triage vital signs and the nursing notes.  Pertinent labs & imaging results that were available during my care of the patient were reviewed by me and considered in my medical decision making (see chart for details).  Presents to the emergency department with erythema, warmth and induration noted on the lateral aspect of her left ankle.  Of note, she pulled a tick off of the same ankle about 2 weeks ago.  No fevers, chills, headache, arthralgias or rash elsewhere.  On exam, findings consistent with cellulitis.  Do not suspect septic joint given patient afebrile, no joint effusion and full ankle ROM without tenderness. Will treat with doxycycline for cellulitis and to cover for potential tickborne illness. Send out labs collected including [facility] spotted fever antibodies, Ehrlichia antibody and B burgdorfi antibodies.  She does not have a PCP, have given her information to establish care with [facility] to follow-up on her blood tests.  Have also counseled her to follow-up on her blood pressure which was elevated in the ER today.  Discussed reasons to return to the emergency department she agrees and appears reliable for follow-up.   Final Clinical Impressions(s) / ED Diagnoses   Final diagnoses:  Tick bite,  initial encounter  Cellulitis of left lower extremity    ED Discharge Orders        Ordered    doxycycline (VIBRAMYCIN) 100 MG capsule  2 times daily     06/17/17 1727       Kellie ShropshireShrosbree, Emily J, PA-C 06/18/17 0152    Terrilee FilesButler, Michael C, MD 06/19/17 1235

## 2017-06-17 NOTE — Discharge Instructions (Addendum)
As we discussed please take anabiotic twice a day for the next 10 days.  Please take all of your antibiotics until finished!   You may develop abdominal discomfort or diarrhea from the antibiotic.  You may help offset this with probiotics which you can buy or get in yogurt. Do not eat  or take the probiotics until 2 hours after your antibiotic.   Please use phone number listed below to contact [facility] to establish care.  Return to the emergency department if you have any new or concerning symptoms like fever, body aches, worsening rash.

## 2017-06-17 NOTE — ED Triage Notes (Signed)
Pt states she was bitten by a tick two weeks ago on her left ankle. A week later she noticed swelling, redness around the site. It has continued to get more red/swollen over the past two weeks.

## 2017-06-18 LAB — EHRLICHIA ANTIBODY PANEL
E CHAFFEENSIS AB, IGG: NEGATIVE
E chaffeensis (HGE) Ab, IgM: NEGATIVE
E. Chaffeensis (HME) IgM Titer: NEGATIVE
E.Chaffeensis (HME) IgG: NEGATIVE

## 2017-06-18 LAB — B. BURGDORFI ANTIBODIES: B burgdorferi Ab IgG+IgM: <0.91 {ISR} (ref 0.00–0.90)

## 2017-06-19 LAB — ROCKY MTN SPOTTED FVR ABS PNL(IGG+IGM)
RMSF IgG: NEGATIVE
RMSF IgM: 0.3 index (ref 0.00–0.89)

## 2017-09-30 ENCOUNTER — Encounter (HOSPITAL_COMMUNITY): Payer: Self-pay

## 2017-09-30 ENCOUNTER — Emergency Department (HOSPITAL_COMMUNITY): Payer: Self-pay

## 2017-09-30 ENCOUNTER — Other Ambulatory Visit: Payer: Self-pay

## 2017-09-30 ENCOUNTER — Emergency Department (HOSPITAL_COMMUNITY)
Admission: EM | Admit: 2017-09-30 | Discharge: 2017-09-30 | Disposition: A | Discharge status: Home / Self Care | Payer: Self-pay | Attending: Emergency Medicine | Admitting: Emergency Medicine

## 2017-09-30 DIAGNOSIS — E119 Type 2 diabetes mellitus without complications: Secondary | ICD-10-CM | POA: Insufficient documentation

## 2017-09-30 DIAGNOSIS — Z7984 Long term (current) use of oral hypoglycemic drugs: Secondary | ICD-10-CM | POA: Insufficient documentation

## 2017-09-30 DIAGNOSIS — I10 Essential (primary) hypertension: Secondary | ICD-10-CM | POA: Insufficient documentation

## 2017-09-30 DIAGNOSIS — R531 Weakness: Secondary | ICD-10-CM | POA: Insufficient documentation

## 2017-09-30 DIAGNOSIS — F17210 Nicotine dependence, cigarettes, uncomplicated: Secondary | ICD-10-CM | POA: Insufficient documentation

## 2017-09-30 DIAGNOSIS — Z79899 Other long term (current) drug therapy: Secondary | ICD-10-CM | POA: Insufficient documentation

## 2017-09-30 DIAGNOSIS — Z9114 Patient's other noncompliance with medication regimen: Secondary | ICD-10-CM | POA: Insufficient documentation

## 2017-09-30 DIAGNOSIS — G43109 Migraine with aura, not intractable, without status migrainosus: Secondary | ICD-10-CM | POA: Insufficient documentation

## 2017-09-30 LAB — RAPID URINE DRUG SCREEN, HOSP PERFORMED
Amphetamines: NOT DETECTED
BARBITURATES: NOT DETECTED
Benzodiazepines: NOT DETECTED
COCAINE: NOT DETECTED
Opiates: NOT DETECTED
Tetrahydrocannabinol: NOT DETECTED

## 2017-09-30 LAB — DIFFERENTIAL
Abs Immature Granulocytes: 0 10*3/uL (ref 0.0–0.1)
Basophils Absolute: 0 10*3/uL (ref 0.0–0.1)
Basophils Relative: 0 %
EOS ABS: 0.1 10*3/uL (ref 0.0–0.7)
EOS PCT: 1 %
Immature Granulocytes: 0 %
LYMPHS ABS: 3 10*3/uL (ref 0.7–4.0)
Lymphocytes Relative: 31 %
MONO ABS: 0.4 10*3/uL (ref 0.1–1.0)
MONOS PCT: 5 %
Neutro Abs: 6.2 10*3/uL (ref 1.7–7.7)
Neutrophils Relative %: 63 %

## 2017-09-30 LAB — COMPREHENSIVE METABOLIC PANEL
ALT: 13 U/L (ref 0–44)
ANION GAP: 10 (ref 5–15)
AST: 15 U/L (ref 15–41)
Albumin: 3.5 g/dL (ref 3.5–5.0)
Alkaline Phosphatase: 74 U/L (ref 38–126)
BILIRUBIN TOTAL: 0.3 mg/dL (ref 0.3–1.2)
BUN: 9 mg/dL (ref 6–20)
CO2: 23 mmol/L (ref 22–32)
Calcium: 8.9 mg/dL (ref 8.9–10.3)
Chloride: 103 mmol/L (ref 98–111)
Creatinine, Ser: 0.65 mg/dL (ref 0.44–1.00)
Glucose, Bld: 318 mg/dL — ABNORMAL HIGH (ref 70–99)
POTASSIUM: 3.8 mmol/L (ref 3.5–5.1)
Sodium: 136 mmol/L (ref 135–145)
TOTAL PROTEIN: 6.2 g/dL — AB (ref 6.5–8.1)

## 2017-09-30 LAB — I-STAT CHEM 8, ED
BUN: 9 mg/dL (ref 6–20)
CREATININE: 0.4 mg/dL — AB (ref 0.44–1.00)
Calcium, Ion: 1.18 mmol/L (ref 1.15–1.40)
Chloride: 102 mmol/L (ref 98–111)
GLUCOSE: 318 mg/dL — AB (ref 70–99)
HCT: 42 % (ref 36.0–46.0)
HEMOGLOBIN: 14.3 g/dL (ref 12.0–15.0)
Potassium: 3.8 mmol/L (ref 3.5–5.1)
Sodium: 138 mmol/L (ref 135–145)
TCO2: 24 mmol/L (ref 22–32)

## 2017-09-30 LAB — URINALYSIS, ROUTINE W REFLEX MICROSCOPIC
Bilirubin Urine: NEGATIVE
Hgb urine dipstick: NEGATIVE
Ketones, ur: NEGATIVE mg/dL
LEUKOCYTES UA: NEGATIVE
Nitrite: NEGATIVE
PH: 6 (ref 5.0–8.0)
Protein, ur: NEGATIVE mg/dL
SPECIFIC GRAVITY, URINE: 1.024 (ref 1.005–1.030)

## 2017-09-30 LAB — I-STAT BETA HCG BLOOD, ED (MC, WL, AP ONLY): I-stat hCG, quantitative: <5 m[IU]/mL (ref <5)

## 2017-09-30 LAB — I-STAT TROPONIN, ED: TROPONIN I, POC: 0 ng/mL (ref 0.00–0.08)

## 2017-09-30 LAB — CBG MONITORING, ED
GLUCOSE-CAPILLARY: 148 mg/dL — AB (ref 70–99)
Glucose-Capillary: 315 mg/dL — ABNORMAL HIGH (ref 70–99)

## 2017-09-30 LAB — ETHANOL: Alcohol, Ethyl (B): <10 mg/dL (ref <10)

## 2017-09-30 LAB — CBC
HCT: 42.8 % (ref 36.0–46.0)
Hemoglobin: 14.1 g/dL (ref 12.0–15.0)
MCH: 28 pg (ref 26.0–34.0)
MCHC: 32.9 g/dL (ref 30.0–36.0)
MCV: 85.1 fL (ref 78.0–100.0)
PLATELETS: 286 10*3/uL (ref 150–400)
RBC: 5.03 MIL/uL (ref 3.87–5.11)
RDW: 13.4 % (ref 11.5–15.5)
WBC: 9.8 10*3/uL (ref 4.0–10.5)

## 2017-09-30 LAB — POC URINE PREG, ED: PREG TEST UR: NEGATIVE

## 2017-09-30 LAB — PROTIME-INR
INR: 0.97
Prothrombin Time: 12.8 seconds (ref 11.4–15.2)

## 2017-09-30 LAB — APTT: aPTT: 28 seconds (ref 24–36)

## 2017-09-30 MED ORDER — IOPAMIDOL (ISOVUE-370) INJECTION 76%
INTRAVENOUS | Status: AC
  Filled 2017-09-30: qty 50

## 2017-09-30 MED ORDER — BUTALBITAL-APAP-CAFFEINE 50-325-40 MG PO TABS: 1.0000 | ORAL_TABLET | Freq: Four times a day (QID) | ORAL | 0 refills | Status: DC | PRN

## 2017-09-30 MED ORDER — METOCLOPRAMIDE HCL 5 MG/ML IJ SOLN
10.0000 mg | Freq: Once | INTRAMUSCULAR | Status: AC
  Administered 2017-09-30: 10 mg via INTRAVENOUS
  Filled 2017-09-30: qty 2

## 2017-09-30 MED ORDER — IOPAMIDOL (ISOVUE-370) INJECTION 76%
100.0000 mL | Freq: Once | INTRAVENOUS | Status: AC
  Administered 2017-09-30: 50 mL via INTRAVENOUS

## 2017-09-30 MED ORDER — SODIUM CHLORIDE 0.9 % IV BOLUS
1000.0000 mL | Freq: Once | INTRAVENOUS | Status: AC
  Administered 2017-09-30: 1000 mL via INTRAVENOUS

## 2017-09-30 MED ORDER — LORAZEPAM 2 MG/ML IJ SOLN
1.0000 mg | INTRAMUSCULAR | Status: DC | PRN
  Administered 2017-09-30: 1 mg via INTRAVENOUS
  Filled 2017-09-30: qty 1

## 2017-09-30 MED ORDER — KETOROLAC TROMETHAMINE 30 MG/ML IJ SOLN
30.0000 mg | Freq: Once | INTRAMUSCULAR | Status: AC
  Administered 2017-09-30: 30 mg via INTRAVENOUS
  Filled 2017-09-30: qty 1

## 2017-09-30 MED ORDER — DIPHENHYDRAMINE HCL 50 MG/ML IJ SOLN
25.0000 mg | Freq: Once | INTRAMUSCULAR | Status: AC
  Administered 2017-09-30: 25 mg via INTRAVENOUS
  Filled 2017-09-30: qty 1

## 2017-09-30 MED ORDER — METFORMIN HCL 1000 MG PO TABS: 1000.0000 mg | ORAL_TABLET | Freq: Two times a day (BID) | ORAL | 0 refills | Status: DC

## 2017-09-30 MED ORDER — LISINOPRIL 40 MG PO TABS: 40.0000 mg | ORAL_TABLET | Freq: Every day | ORAL | 0 refills | Status: DC

## 2017-09-30 NOTE — ED Triage Notes (Signed)
Pt states that she is supposed to be on BP medications but has been out. Pt reports right sided numbness that started last night with a headache X3 days. Pt has left sided facial muscle spasms which she reports is chronic for her.

## 2017-09-30 NOTE — ED Notes (Signed)
MRI called for patient. Transport staff in route to get patient

## 2017-09-30 NOTE — ED Notes (Addendum)
Reports headache X 3 days with seeing spots "im seeing things floating", reports  numbness and tingling since last night, "sharp pain and constant throbbing"  on the back of her right side head-patient took Excerdrine and Aleve without relieve. Patient is a current smoker, she reports that she ran out of her blood pressure and diabetes medication 3 months ago.

## 2017-09-30 NOTE — ED Provider Notes (Signed)
[facility] EMERGENCY DEPARTMENT Provider Note   CSN: [id number] Arrival date & time: 09/30/17  0844     History   Chief Complaint Chief Complaint  Patient presents with  . Numbness    HPI [name] [name] is a 37 y.o. female.  The history is provided by the patient. No language interpreter was used.     37 year old female with hx of DM, HTN, smoker here with headache x 3 days and R side numbness.  Numbness started last night.  Patient describes headache as a throbbing sensation to the back of her head with occasional sharp shooting pain down to her neck and shoulder.  Headache has been persistent for about 3 to 4 days however last night she also noticed numbness sensation primarily to the entire right side of her body.  She also reports seeing spots in her right eye without any blurry vision or double vision.  She tries taking Excedrin and Aleve without relief.  She went to sleep and this morning symptoms still persist prompting her to come to ED.  Patient also mentioned that she ran out of her usual medication 34 months ago because she is currently does not have a primary care provider after moving to this area.  She mentioned having similar symptoms like this last year, evaluate in hospital but left AGAINST MEDICAL ADVICE before being fully evaluated for potential stroke.  She admits to smoke approximately approximately a day but denies alcohol use drug use.  No report of fever, chills, cold symptoms, or rash.  Does admits to having increasing stress at work.  Ran out of meds 3-4 months.  Headache occipital, throbbing with occasional sharp pain down to neck and shoulder.  R sided numbness started last night.  Headache worse today.  Seeing spot in R eye but no diplopia or blurred vision.  Increase stress at work. No F/C.  Took exedrine and aleve without relief.   CN III-XII intact.  No facial droop.  Decreased sensation to R side.  Normal finger to nose, decreased R grip  strength.  Has normal gait but drag R foot.  Normal Heel to shin. No pronator drift.      Past Medical History:  Diagnosis Date  . Anxiety   . Back fracture   . Depression   . Diabetes mellitus without complication (HCC)   . Family history of adverse reaction to anesthesia    sister gets nausea and vomitting and itching on her face. Around her nose and mouth.  . Fatty liver disease, nonalcoholic   . GERD (gastroesophageal reflux disease)   . H/O cesarean section complicating pregnancy 04/18/2014  . Hypertension   . Neuromuscular disorder (HCC)    eye twitch on left eye   . Obesity   . Pelvis fracture (HCC)   . S/P cesarean section 06/01/2014  . S/P tubal ligation 06/01/2014    Patient Active Problem List   Diagnosis Date Noted  . S/P cesarean section 06/01/2014  . S/P tubal ligation 06/01/2014  . H/O cesarean section complicating pregnancy 04/18/2014  . Anxiety 03/16/2013  . Smoking 03/16/2013  . Acute chest pain 03/01/2013    Past Surgical History:  Procedure Laterality Date  . CESAREAN SECTION    . CESAREAN SECTION WITH BILATERAL TUBAL LIGATION Bilateral 06/01/2014   Procedure: CESAREAN SECTION WITH BILATERAL TUBAL LIGATION;  Surgeon: Sherian Rein, MD;  Location: [facility];  Service: Obstetrics;  Laterality: Bilateral;  MD requests RNFA  . CHEST TUBE  INSERTION    . TONSILLECTOMY    . TRACHEOSTOMY       OB History    Gravida  2   Para  2   Term  2   Preterm      AB      Living  2     SAB      TAB      Ectopic      Multiple  0   Live Births  2            Home Medications    Prior to Admission medications   Medication Sig Start Date End Date Taking? Authorizing Provider  cyclobenzaprine (FLEXERIL) 10 MG tablet Take 1 tablet (10 mg total) by mouth 2 (two) times daily as needed for muscle spasms. 10/10/15   Danelle Berry, PA-C  doxycycline (VIBRAMYCIN) 100 MG capsule Take 1 capsule (100 mg total) by mouth 2 (two) times daily. 06/17/17    Kellie Shropshire, PA-C  hydrOXYzine (ATARAX/VISTARIL) 25 MG tablet Take 1 tablet (25 mg total) by mouth every 8 (eight) hours as needed for anxiety. 10/10/15   Danelle Berry, PA-C  ibuprofen (ADVIL,MOTRIN) 800 MG tablet Take 1 tablet (800 mg total) by mouth 3 (three) times daily. 10/10/15   Danelle Berry, PA-C  lisinopril (PRINIVIL,ZESTRIL) 20 MG tablet Take 1 tablet (20 mg total) by mouth daily. 12/19/15   Gerhard Munch, MD  metFORMIN (GLUCOPHAGE) 500 MG tablet Take 1 tablet (500 mg total) by mouth 2 (two) times daily with a meal. 12/19/15   Gerhard Munch, MD  oxyCODONE-acetaminophen (PERCOCET) 5-325 MG tablet Take 1 tablet by mouth every 4 (four) hours as needed. 10/10/15   Danelle Berry, PA-C  oxyCODONE-acetaminophen (PERCOCET/ROXICET) 5-325 MG per tablet Take 1-2 tablets by mouth every 6 (six) hours as needed (for pain scale greater than 7). 06/03/14   Bovard-Stuckert, Augusto Gamble, MD  predniSONE (DELTASONE) 20 MG tablet Take 2 tablets (40 mg total) by mouth daily with breakfast. For the next four days 12/19/15   Gerhard Munch, MD  pseudoephedrine (SUDAFED 12 HOUR) 120 MG 12 hr tablet Take 1 tablet (120 mg total) by mouth 2 (two) times daily. Use twice daily for five days 12/19/15   Gerhard Munch, MD  triamcinolone cream (KENALOG) 0.1 % Apply 1 application topically 2 (two) times daily. Patient not taking: Reported on 10/10/2015 05/08/15   Hayden Rasmussen, NP    Family History Family History  Problem Relation Age of Onset  . Diabetes Maternal Grandmother   . Diabetes Paternal Grandmother     Social History Social History   Tobacco Use  . Smoking status: Current Every Day Smoker    Packs/day: 1.00    Years: 12.00    Pack years: 12.00    Types: Cigarettes  . Smokeless tobacco: Never Used  Substance Use Topics  . Alcohol use: No  . Drug use: No     Allergies   Patient has no known allergies.   Review of Systems Review of Systems  All other systems reviewed and are  negative.    Physical Exam Updated Vital Signs BP (!) 166/95   Pulse 96   Temp 97.8 F (36.6 C) (Oral)   Resp 20   Ht 5\' 1"  (1.549 m)   Wt 95.3 kg   LMP 09/01/2017   SpO2 100%   BMI 39.68 kg/m   Physical Exam  Constitutional: She is oriented to person, place, and time. She appears well-developed and well-nourished. No distress.  Patient appears  anxious but nontoxic in appearance  HENT:  Head: Atraumatic.  Eyes: Pupils are equal, round, and reactive to light. Conjunctivae and EOM are normal.  Neck: Normal range of motion. Neck supple.  No nuchal rigidity  Cardiovascular: Normal rate and regular rhythm.  Pulmonary/Chest: Effort normal and breath sounds normal.  Abdominal: Soft. Bowel sounds are normal. She exhibits no distension. There is no tenderness.  Neurological: She is alert and oriented to person, place, and time.  Neurologic exam:  Speech clear, pupils equal round reactive to light, extraocular movements intact  Normal peripheral visual fields Cranial nerves III through XII normal including no facial droop. However periodically pt squint L side of face when not talking. Follows commands, moves all extremities x4, Decreased strength and sensation to R side of body as compare to L. Coordination is poor on R side with finger to nose, normal heel to shin. Rapid alternating movements delay Right pronator drift Gait normal   Skin: No rash noted.  Psychiatric: She has a normal mood and affect.  Nursing note and vitals reviewed.    ED Treatments / Results  Labs (all labs ordered are listed, but only abnormal results are displayed) Labs Reviewed  COMPREHENSIVE METABOLIC PANEL - Abnormal; Notable for the following components:      Result Value   Glucose, Bld 318 (*)    Total Protein 6.2 (*)    All other components within normal limits  URINALYSIS, ROUTINE W REFLEX MICROSCOPIC - Abnormal; Notable for the following components:   Glucose, UA >=500 (*)    Bacteria,  UA RARE (*)    All other components within normal limits  I-STAT CHEM 8, ED - Abnormal; Notable for the following components:   Creatinine, Ser 0.40 (*)    Glucose, Bld 318 (*)    All other components within normal limits  CBG MONITORING, ED - Abnormal; Notable for the following components:   Glucose-Capillary 315 (*)    All other components within normal limits  CBG MONITORING, ED - Abnormal; Notable for the following components:   Glucose-Capillary 148 (*)    All other components within normal limits  ETHANOL  APTT  CBC  DIFFERENTIAL  RAPID URINE DRUG SCREEN, HOSP PERFORMED  PROTIME-INR  POC URINE PREG, ED  I-STAT TROPONIN, ED  I-STAT BETA HCG BLOOD, ED ([facility], [facility], [facility] ONLY)    EKG EKG Interpretation  Date/Time:  Thursday September 30 2017 08:50:45 EDT Ventricular Rate:  91 PR Interval:    QRS Duration: 75 QT Interval:  363 QTC Calculation: 447 R Axis:   53 Text Interpretation:  Sinus rhythm Low voltage, precordial leads Baseline wander in lead(s) II III aVR aVL aVF V3 V4 V5 Confirmed by Donnetta Hutching ([id number]) on 09/30/2017 9:27:36 AM   Radiology Ct Angio Head W Or Wo Contrast  Result Date: 09/30/2017 CLINICAL DATA:  37 year old female who is out of her blood pressure medications. Headache for 3 days with numbness beginning last night. EXAM: CT ANGIOGRAPHY HEAD AND NECK TECHNIQUE: Multidetector CT imaging of the head and neck was performed using the standard protocol during bolus administration of intravenous contrast. Multiplanar CT image reconstructions and MIPs were obtained to evaluate the vascular anatomy. Carotid stenosis measurements (when applicable) are obtained utilizing NASCET criteria, using the distal internal carotid diameter as the denominator. CONTRAST:  50mL ISOVUE-370 IOPAMIDOL (ISOVUE-370) INJECTION 76% COMPARISON:  Head CT without contrast 1001 hours today. Brain MRI 03/05/2010. FINDINGS: CTA NECK Skeleton: Some carious dentition is noted (left maxillary posterior  molar). Incidental  oral tongue piercing. Nonspecific reversal of cervical lordosis. No other osseous abnormality identified. Upper chest: Negative visible lungs and mediastinum. Other neck: Negative. No neck mass or lymphadenopathy. Visualized orbits and scalp soft tissues are within normal limits. Aortic arch: Normal, 3 vessel arch configuration. Right carotid system: Motion artifact at the distal right CCA level, otherwise normal right CCA. Widely patent right carotid bifurcation. Tortuous but otherwise negative cervical right ICA. Left carotid system: Motion artifact at the distal left CCA level. Normal left CCA otherwise. Motion also affects the left carotid bifurcation and proximal left ICA, but these appear to be widely patent. Tortuous but otherwise negative more distal cervical left ICA. Vertebral arteries: Normal proximal right subclavian artery and right vertebral artery origin. Patent right vertebral artery to the skull base with some tortuosity (V3 segment) but no stenosis. Normal proximal left subclavian artery and left vertebral artery origin. Mildly tortuous left V1 segment. Patent left vertebral artery to the skull base without stenosis. CTA HEAD Posterior circulation: Codominant distal vertebral arteries are patent to the vertebrobasilar junction with mild tortuosity but no stenosis. Both PICA origins appear patent. Patent basilar artery without stenosis. Patent SCA and left PCA origins. Fetal right PCA origin. Left Posterior communicating artery is diminutive or absent. Bilateral PCA branches are within normal limits. Anterior circulation: Patent ICA siphons no siphon atherosclerosis or stenosis. Normal ophthalmic and right posterior communicating artery origins. Patent carotid termini. Normal MCA and ACA origins. Anterior communicating artery within normal limits. The right ACA is mildly dominant. Bilateral ACA branches are within normal limits. Left MCA M1 segment, left MCA trifurcation, and left  MCA branches are within normal limits. Right MCA M1 segment, bifurcation, and right MCA branches are within normal limits. Venous sinuses: Patent on the delayed images. Anatomic variants: Fetal right PCA origin.  Dominant right ACA. Delayed phase: No abnormal enhancement identified. Gray-white matter differentiation is stable and normal. Review of the MIP images confirms the above findings IMPRESSION: 1. Motion artifact affecting detail of both carotid bifurcations in the neck. The bilateral cervical carotid arteries otherwise appear normal except for tortuosity. 2. Normal vertebral arteries aside from tortuosity. 3. Normal intracranial CTA: Negative for large vessel occlusion, negative for intracranial aneurysm. 4. Stable and negative CT appearance of the brain since 1002 hours today. Electronically Signed   By: Odessa Fleming M.D.   On: 09/30/2017 11:16   Ct Head Wo Contrast  Result Date: 09/30/2017 CLINICAL DATA:  Headaches for several days EXAM: CT HEAD WITHOUT CONTRAST TECHNIQUE: Contiguous axial images were obtained from the base of the skull through the vertex without intravenous contrast. COMPARISON:  03/05/2010 MRI of the brain FINDINGS: Brain: No evidence of acute infarction, hemorrhage, hydrocephalus, extra-axial collection or mass lesion/mass effect. Vascular: No hyperdense vessel or unexpected calcification. Skull: Normal. Negative for fracture or focal lesion. Sinuses/Orbits: No acute finding. Other: None. IMPRESSION: No acute intracranial abnormality noted. Electronically Signed   By: Alcide Clever M.D.   On: 09/30/2017 10:06   Ct Angio Neck W And/or Wo Contrast  Result Date: 09/30/2017 CLINICAL DATA:  37 year old female who is out of her blood pressure medications. Headache for 3 days with numbness beginning last night. EXAM: CT ANGIOGRAPHY HEAD AND NECK TECHNIQUE: Multidetector CT imaging of the head and neck was performed using the standard protocol during bolus administration of intravenous  contrast. Multiplanar CT image reconstructions and MIPs were obtained to evaluate the vascular anatomy. Carotid stenosis measurements (when applicable) are obtained utilizing NASCET criteria, using the distal internal carotid  diameter as the denominator. CONTRAST:  50mL ISOVUE-370 IOPAMIDOL (ISOVUE-370) INJECTION 76% COMPARISON:  Head CT without contrast 1001 hours today. Brain MRI 03/05/2010. FINDINGS: CTA NECK Skeleton: Some carious dentition is noted (left maxillary posterior molar). Incidental oral tongue piercing. Nonspecific reversal of cervical lordosis. No other osseous abnormality identified. Upper chest: Negative visible lungs and mediastinum. Other neck: Negative. No neck mass or lymphadenopathy. Visualized orbits and scalp soft tissues are within normal limits. Aortic arch: Normal, 3 vessel arch configuration. Right carotid system: Motion artifact at the distal right CCA level, otherwise normal right CCA. Widely patent right carotid bifurcation. Tortuous but otherwise negative cervical right ICA. Left carotid system: Motion artifact at the distal left CCA level. Normal left CCA otherwise. Motion also affects the left carotid bifurcation and proximal left ICA, but these appear to be widely patent. Tortuous but otherwise negative more distal cervical left ICA. Vertebral arteries: Normal proximal right subclavian artery and right vertebral artery origin. Patent right vertebral artery to the skull base with some tortuosity (V3 segment) but no stenosis. Normal proximal left subclavian artery and left vertebral artery origin. Mildly tortuous left V1 segment. Patent left vertebral artery to the skull base without stenosis. CTA HEAD Posterior circulation: Codominant distal vertebral arteries are patent to the vertebrobasilar junction with mild tortuosity but no stenosis. Both PICA origins appear patent. Patent basilar artery without stenosis. Patent SCA and left PCA origins. Fetal right PCA origin. Left  Posterior communicating artery is diminutive or absent. Bilateral PCA branches are within normal limits. Anterior circulation: Patent ICA siphons no siphon atherosclerosis or stenosis. Normal ophthalmic and right posterior communicating artery origins. Patent carotid termini. Normal MCA and ACA origins. Anterior communicating artery within normal limits. The right ACA is mildly dominant. Bilateral ACA branches are within normal limits. Left MCA M1 segment, left MCA trifurcation, and left MCA branches are within normal limits. Right MCA M1 segment, bifurcation, and right MCA branches are within normal limits. Venous sinuses: Patent on the delayed images. Anatomic variants: Fetal right PCA origin.  Dominant right ACA. Delayed phase: No abnormal enhancement identified. Gray-white matter differentiation is stable and normal. Review of the MIP images confirms the above findings IMPRESSION: 1. Motion artifact affecting detail of both carotid bifurcations in the neck. The bilateral cervical carotid arteries otherwise appear normal except for tortuosity. 2. Normal vertebral arteries aside from tortuosity. 3. Normal intracranial CTA: Negative for large vessel occlusion, negative for intracranial aneurysm. 4. Stable and negative CT appearance of the brain since 1002 hours today. Electronically Signed   By: Odessa Fleming M.D.   On: 09/30/2017 11:16   Mr Brain Wo Contrast  Result Date: 09/30/2017 CLINICAL DATA:  37 year old female who is out of her blood pressure medications. Headache for 3 days with numbness beginning last night. EXAM: MRI HEAD WITHOUT CONTRAST TECHNIQUE: Multiplanar, multiecho pulse sequences of the brain and surrounding structures were obtained without intravenous contrast. COMPARISON:  CTA head and neck earlier today.  Brain MRI 03/05/2010. FINDINGS: Study is intermittently degraded by motion artifact despite repeated imaging attempts. Brain: No restricted diffusion to suggest acute infarction. No midline  shift, mass effect, evidence of mass lesion, ventriculomegaly, extra-axial collection or acute intracranial hemorrhage. Cervicomedullary junction and pituitary are within normal limits. Scattered small nonspecific foci of subcortical cerebral white matter T2 and FLAIR hyperintensity in the right hemisphere are mild but increased since 2012 (series 15, image 17). Elsewhere gray and white matter signal is stable and normal throughout the brain. No cortical encephalomalacia or chronic cerebral  blood products identified. The deep gray matter nuclei, brainstem, and cerebellum appear normal. Vascular: Major intracranial vascular flow voids are stable and preserved. Skull and upper cervical spine: Negative visible cervical spine. Visualized bone marrow signal is within normal limits. Sinuses/Orbits: Stable and negative. Other: Mastoids remain clear. Scalp and face soft tissues appear negative. IMPRESSION: 1.  No acute intracranial abnormality. 2. Increased mild nonspecific right frontal lobe white matter signal changes since 2012. In this clinical setting this may be most likely due to chronic hypertension/small vessel disease. Electronically Signed   By: Odessa FlemingH  Hall M.D.   On: 09/30/2017 13:20    Procedures Procedures (including critical care time)  Medications Ordered in ED Medications  LORazepam (ATIVAN) injection 1 mg (1 mg Intravenous Given 09/30/17 1132)  iopamidol (ISOVUE-370) 76 % injection (has no administration in time range)  sodium chloride 0.9 % bolus 1,000 mL (0 mLs Intravenous Stopped 09/30/17 1419)  iopamidol (ISOVUE-370) 76 % injection 100 mL (50 mLs Intravenous Contrast Given 09/30/17 1041)  ketorolac (TORADOL) 30 MG/ML injection 30 mg (30 mg Intravenous Given 09/30/17 1412)  diphenhydrAMINE (BENADRYL) injection 25 mg (25 mg Intravenous Given 09/30/17 1412)  metoCLOPramide (REGLAN) injection 10 mg (10 mg Intravenous Given 09/30/17 1412)  sodium chloride 0.9 % bolus 1,000 mL (1,000 mLs Intravenous New  Bag/Given 09/30/17 1413)     Initial Impression / Assessment and Plan / ED Course  I have reviewed the triage vital signs and the nursing notes.  Pertinent labs & imaging results that were available during my care of the patient were reviewed by me and considered in my medical decision making (see chart for details).     BP 118/80   Pulse 81   Temp 97.8 F (36.6 C) (Oral)   Resp 20   Ht 5\' 1"  (1.549 m)   Wt 95.3 kg   LMP 09/01/2017   SpO2 97%   BMI 39.68 kg/m    Final Clinical Impressions(s) / ED Diagnoses   Final diagnoses:  Complicated migraine    ED Discharge Orders         Ordered    butalbital-acetaminophen-caffeine (FIORICET, ESGIC) 50-325-40 MG tablet  Every 6 hours PRN     09/30/17 1539    metFORMIN (GLUCOPHAGE) 1000 MG tablet  2 times daily with meals     09/30/17 1539    lisinopril (PRINIVIL,ZESTRIL) 40 MG tablet  Daily     09/30/17 1539         9:38 AM Patient here with complaints of progressive worsening headache.  Furthermore, also complaining of numbness and weakness involving the right side of her body including her face.  She is outside of the stroke window.  However, she is VAN positive on exam.  When interviewed by Dr. Adriana Simasook, pt report R side paresthesia started at 5am.  He recommend activating code stroke.  Code stroke activated.   10:22 AM Appreciate evaluation by neurologist Dr. Laurence SlateAroor who cancel code stroke, and felt this is likely to be complicated migraine.  He recommend head and neck CTA along with brain MRI to r/o stroke or dissection. If negative, recommend migraine cocktail.  2:28 PM Pregnancy test is negative, electrolyte panel is remarkable for elevated CBG of 318 with normal anion gap.  Normal troponin, normal alcohol level, normal H&H and normal WBC.  UDS without any positive results, urine shows more than 500 glucose but no ketones to suggest DKA.  Brain MRI without acute intracranial abnormalities.  CT angiogram of head and neck  unremarkable.  3:43 PM Patient felt better after receiving migraine cocktail.  Her CBG did improve as well after IV fluid and now it is 148.  At this time I suspect patient has competent migraine and she is stable for discharge.  I have refilled her lisinopril and her metformin.  I have also provide outpatient resource for patient to follow-up and find her primary care provider.  Patient understands to return if her condition worsen.  She is stable for discharge.   [name], PA-C 09/30/17 1544    Donnetta Hutching, MD 10/01/17 [phone]

## 2017-09-30 NOTE — Progress Notes (Signed)
Patient came to CT for a CTA head/neck. Patient IV extravasated after contrast injection during the saline flush.  RN made aware.

## 2017-09-30 NOTE — ED Notes (Signed)
Patient transported to CT 

## 2017-09-30 NOTE — ED Notes (Signed)
Neurologist at bedside. 

## 2017-09-30 NOTE — Consult Note (Addendum)
Neurology Consultation ^^^For educational purposed only^^^   This is  Note is for educational purpose only.   This is  Note is for educational purpose only.   Reason for Consult: suspected stroke   CC: headache  History is obtained from: patient   HPI: [name] [name] is a 37 y.o. female with a PMH significant for diabetes mellitus type II, hypertension, anxiety, neuromuscular disorder of the left eye and tobacco abuse that presents the ED on today (09/30/17) with an acute onset of headache for the past 3 days. The headache is constant, rated at 9/10 on the numerical pain scale by the patient, described as throbbing on the crown of the head and sharp radiating pain down the the neck and right arm. Associated symptoms include right-sided weakness and numbness, dizziness, photosensitivity and floaters in the eyes. Patient reports taking OTC aleve, Excedrin and Advil with little relief. Therefore, patient was brought into the ED and Neurology service was consulted.     Upon ED arrival, patient was found to have unilateral weakness and numbness on the right side. Initial bp=166/88 with hr=106. Patient was given 1,059mL NS bolus and a CT HEAD WO contrast was performed and revealed no acute intracranial abnormality.   Upon examination, patient was very tearful and  reports that she has recently moved from [place] about 3 months ago and has been out of all of her bp, diabetic, anxiety and anti-depressant medications since then. She does not have insurance and has been on the waiting list for community, income-based medical resources. She has recently started a new job that has added a significant amount of stress to her life. Patient reports smoking 1 pack of cigarettes daily, but denies all ETOH and illicit drug use. Patient's admits to having an unhealthy diet in which she eats only 1 meal per day, but she states that she walks her dog daily for about 1-1.5 hours for exercise. Patient denies all nausea,  vomiting, chest pain, shortness of breath at this time and HI/SI at this time.      ROS: A 14 point ROS was performed and is negative except as noted in the HPI.   Past Medical History:  Diagnosis Date  . Anxiety   . Back fracture   . Depression   . Diabetes mellitus without complication (HCC)   . Family history of adverse reaction to anesthesia    sister gets nausea and vomitting and itching on her face. Around her nose and mouth.  . Fatty liver disease, nonalcoholic   . GERD (gastroesophageal reflux disease)   . H/O cesarean section complicating pregnancy 04/18/2014  . Hypertension   . Neuromuscular disorder (HCC)    eye twitch on left eye   . Obesity   . Pelvis fracture (HCC)   . S/P cesarean section 06/01/2014  . S/P tubal ligation 06/01/2014    Family History  Problem Relation Age of Onset  . Diabetes Maternal Grandmother   . Diabetes Paternal Grandmother     Social History:   reports that she has been smoking cigarettes. She has a 12.00 pack-year smoking history. She has never used smokeless tobacco. She reports that she does not drink alcohol or use drugs.  Medications  Current Facility-Administered Medications:  .  iopamidol (ISOVUE-370) 76 % injection, , , ,  .  LORazepam (ATIVAN) injection 1 mg, 1 mg, Intravenous, Q4H PRN, [name], PA-C .  sodium chloride 0.9 % bolus 1,000 mL, 1,000 mL, Intravenous, Once, [name], PA-C, Last Rate:  1,935.5 mL/hr at 09/30/17 1045, 1,000 mL at 09/30/17 1045  Current Outpatient Medications:  .  cyclobenzaprine (FLEXERIL) 10 MG tablet, Take 1 tablet (10 mg total) by mouth 2 (two) times daily as needed for muscle spasms., Disp: 20 tablet, Rfl: 0 .  doxycycline (VIBRAMYCIN) 100 MG capsule, Take 1 capsule (100 mg total) by mouth 2 (two) times daily., Disp: 20 capsule, Rfl: 0 .  hydrOXYzine (ATARAX/VISTARIL) 25 MG tablet, Take 1 tablet (25 mg total) by mouth every 8 (eight) hours as needed for anxiety., Disp: 12 tablet, Rfl: 0 .   ibuprofen (ADVIL,MOTRIN) 800 MG tablet, Take 1 tablet (800 mg total) by mouth 3 (three) times daily., Disp: 21 tablet, Rfl: 0 .  lisinopril (PRINIVIL,ZESTRIL) 20 MG tablet, Take 1 tablet (20 mg total) by mouth daily., Disp: 60 tablet, Rfl: 0 .  metFORMIN (GLUCOPHAGE) 500 MG tablet, Take 1 tablet (500 mg total) by mouth 2 (two) times daily with a meal., Disp: 60 tablet, Rfl: 0 .  oxyCODONE-acetaminophen (PERCOCET) 5-325 MG tablet, Take 1 tablet by mouth every 4 (four) hours as needed., Disp: 10 tablet, Rfl: 0 .  oxyCODONE-acetaminophen (PERCOCET/ROXICET) 5-325 MG per tablet, Take 1-2 tablets by mouth every 6 (six) hours as needed (for pain scale greater than 7)., Disp: 40 tablet, Rfl: 0 .  predniSONE (DELTASONE) 20 MG tablet, Take 2 tablets (40 mg total) by mouth daily with breakfast. For the next four days, Disp: 8 tablet, Rfl: 0 .  pseudoephedrine (SUDAFED 12 HOUR) 120 MG 12 hr tablet, Take 1 tablet (120 mg total) by mouth 2 (two) times daily. Use twice daily for five days, Disp: 10 tablet, Rfl: 0 .  triamcinolone cream (KENALOG) 0.1 %, Apply 1 application topically 2 (two) times daily. (Patient not taking: Reported on 10/10/2015), Disp: 30 g, Rfl: 0   Exam: Current vital signs: BP (!) 166/95   Pulse 96   Temp 97.8 F (36.6 C) (Oral)   Resp 20   Ht 5\' 1"  (1.549 m)   Wt 95.3 kg   LMP 09/01/2017   SpO2 100%   BMI 39.68 kg/m  Vital signs in last 24 hours: Temp:  [97.8 F (36.6 C)] 97.8 F (36.6 C) (09/19 0851) Pulse Rate:  [91-106] 96 (09/19 0900) Resp:  [20] 20 (09/19 0900) BP: (166)/(88-95) 166/95 (09/19 0900) SpO2:  [98 %-100 %] 100 % (09/19 0900) Weight:  [95.3 kg] 95.3 kg (09/19 0848)  GENERAL: Awake, alert in NAD, tearful  HEENT: - Normocephalic and atraumatic, dry mm, negative for lymph node edema, trachea midline, sclera white, conjunctive pink, negative for nasal drainage, tinnitus, or ear exudates.   LUNGS - Clear to auscultation bilaterally with no wheezes CV - S1S2 RRR,  equal pulses bilaterally. ABDOMEN - Soft, nontender, nondistended with normoactive BS Ext: warm, well perfused, intact peripheral pulses, negative for edema  NEURO:  Mental Status: AA&Ox3, speech is clear,  Naming, repetition, fluency, and comprehension intact. Cranial Nerves: PERRL, 3 mm/brisk. EOMI, visual fields full, no facial asymmetry,decreased facial sensation to the right side, temporal and masseter muscles intact, hearing intact, tongue/uvula/soft palate midline Motor: full ROM in all extremities, right side is weaker than left  Tone: is normal and bulk is normal Sensation- decreased sensation to the right-side  Coordination: FTN intact bilaterally,  Gait- steady gate    Labs I have reviewed labs in epic and the results pertinent to this consultation are:  CBC    Component Value Date/Time   WBC 9.8 09/30/2017 0928   RBC 5.03  09/30/2017 0928   HGB 14.3 09/30/2017 0941   HCT 42.0 09/30/2017 0941   PLT 286 09/30/2017 0928   MCV 85.1 09/30/2017 0928   MCH 28.0 09/30/2017 0928   MCHC 32.9 09/30/2017 0928   RDW 13.4 09/30/2017 0928   LYMPHSABS 3.0 09/30/2017 0928   MONOABS 0.4 09/30/2017 0928   EOSABS 0.1 09/30/2017 0928   BASOSABS 0.0 09/30/2017 0928    CMP     Component Value Date/Time   NA 138 09/30/2017 0941   K 3.8 09/30/2017 0941   CL 102 09/30/2017 0941   CO2 23 09/30/2017 0928   GLUCOSE 318 (H) 09/30/2017 0941   BUN 9 09/30/2017 0941   CREATININE 0.40 (L) 09/30/2017 0941   CALCIUM 8.9 09/30/2017 0928   PROT 6.2 (L) 09/30/2017 0928   ALBUMIN 3.5 09/30/2017 0928   AST 15 09/30/2017 0928   ALT 13 09/30/2017 0928   ALKPHOS 74 09/30/2017 0928   BILITOT 0.3 09/30/2017 0928   GFRNONAA >60 09/30/2017 0928   GFRAA >60 09/30/2017 0928    Lipid Panel  No results found for: CHOL, TRIG, HDL, CHOLHDL, VLDL, LDLCALC, LDLDIRECT   Imaging I have reviewed the images obtained:  CT Angio Head W or Wo Contrast: Impression:  1. Motion artifact affecting detail of  both carotid bifurcations in the neck. The bilateral cervical carotid arteries otherwise appear normal except for tortuosity.  2. Normal vertebral arteries aside from tortuosity. 3. Normal intracranial CTA: Negative for large vessel occlusion, negative for intracranial aneurysm. 4. Stable and negative CT appearance of the brain since 1002 hours today.    MRI examination of the brain: in progress     09/30/2017, 11:01 AM     Assessment & Impression: Mrs. Futrell is a 37 year old, caucasian female that presents with a 3 day history of an headache of acute onset that is accompanied by right-side weakness and numbness. She reports not taking any of her prescribed bp or diabetic medications for the last three months, admits to an significant increase in stress levels lately and was very tearful during the exam. At this time the differential diagnosis includes possible stroke likely secondary to uncontrolled hypertension and diabetes; versus complicated migraine likely secondary to increased stress levels.     This is  Note is for educational purpose only.

## 2017-09-30 NOTE — ED Notes (Signed)
D/c reviewed with patient and partner. Encouraged to adhere to home meds as ordered

## 2017-09-30 NOTE — Consult Note (Signed)
[name] [name] is a 37 y.o. female with a PMH significant for diabetes mellitus type II, hypertension, anxiety, neuromuscular disorder of the left eye and tobacco abuse that presents the ED on today (09/30/17) with an acute onset of headache for the past 3 days. The headache is constant, rated at 9/10 on the numerical pain scale by the patient, described as throbbing on the crown of the head and sharp radiating pain down the the neck and right arm. Associated symptoms include right-sided weakness and numbness, dizziness, photosensitivity and floaters in the eyes. Patient reports taking OTC aleve, Excedrin and Advil with little relief. Therefore, patient was brought into the ED and Neurology service was consulted.     Upon ED arrival, patient was found to have unilateral weakness and numbness on the right side. Initial bp=166/88 with hr=106. Patient was given 1,067mL NS bolus and a CT HEAD WO contrast was performed and revealed no acute intracranial abnormality.   Upon examination, patient was very tearful and  reports that she has recently moved from [place] about 3 months ago and has been out of all of her bp, diabetic, anxiety and anti-depressant medications since then. She does not have insurance and has been on the waiting list for community, income-based medical resources. She has recently started a new job that has added a significant amount of stress to her life. Patient reports smoking 1 pack of cigarettes daily, but denies all ETOH and illicit drug use. Patient's admits to having an unhealthy diet in which she eats only 1 meal per day, but she states that she walks her dog daily for about 1-1.5 hours for exercise. Patient denies all nausea, vomiting, chest pain, shortness of breath at this time and HI/SI at this time.      ROS: A 14 point ROS was performed and is negative except as noted in the HPI.       Past Medical History:  Diagnosis Date  . Anxiety   . Back fracture   .  Depression   . Diabetes mellitus without complication (HCC)   . Family history of adverse reaction to anesthesia    sister gets nausea and vomitting and itching on her face. Around her nose and mouth.  . Fatty liver disease, nonalcoholic   . GERD (gastroesophageal reflux disease)   . H/O cesarean section complicating pregnancy 04/18/2014  . Hypertension   . Neuromuscular disorder (HCC)    eye twitch on left eye   . Obesity   . Pelvis fracture (HCC)   . S/P cesarean section 06/01/2014  . S/P tubal ligation 06/01/2014         Family History  Problem Relation Age of Onset  . Diabetes Maternal Grandmother   . Diabetes Paternal Grandmother     Social History:   reports that she has been smoking cigarettes. She has a 12.00 pack-year smoking history. She has never used smokeless tobacco. She reports that she does not drink alcohol or use drugs.  Medications  Current Facility-Administered Medications:  .  iopamidol (ISOVUE-370) 76 % injection, , , ,  .  LORazepam (ATIVAN) injection 1 mg, 1 mg, Intravenous, Q4H PRN, [name], PA-C .  sodium chloride 0.9 % bolus 1,000 mL, 1,000 mL, Intravenous, Once, [name], PA-C, Last Rate: 1,935.5 mL/hr at 09/30/17 1045, 1,000 mL at 09/30/17 1045  Current Outpatient Medications:  .  cyclobenzaprine (FLEXERIL) 10 MG tablet, Take 1 tablet (10 mg total) by mouth 2 (two) times daily as needed for muscle spasms.,  Disp: 20 tablet, Rfl: 0 .  doxycycline (VIBRAMYCIN) 100 MG capsule, Take 1 capsule (100 mg total) by mouth 2 (two) times daily., Disp: 20 capsule, Rfl: 0 .  hydrOXYzine (ATARAX/VISTARIL) 25 MG tablet, Take 1 tablet (25 mg total) by mouth every 8 (eight) hours as needed for anxiety., Disp: 12 tablet, Rfl: 0 .  ibuprofen (ADVIL,MOTRIN) 800 MG tablet, Take 1 tablet (800 mg total) by mouth 3 (three) times daily., Disp: 21 tablet, Rfl: 0 .  lisinopril (PRINIVIL,ZESTRIL) 20 MG tablet, Take 1 tablet (20 mg total) by mouth daily.,  Disp: 60 tablet, Rfl: 0 .  metFORMIN (GLUCOPHAGE) 500 MG tablet, Take 1 tablet (500 mg total) by mouth 2 (two) times daily with a meal., Disp: 60 tablet, Rfl: 0 .  oxyCODONE-acetaminophen (PERCOCET) 5-325 MG tablet, Take 1 tablet by mouth every 4 (four) hours as needed., Disp: 10 tablet, Rfl: 0 .  oxyCODONE-acetaminophen (PERCOCET/ROXICET) 5-325 MG per tablet, Take 1-2 tablets by mouth every 6 (six) hours as needed (for pain scale greater than 7)., Disp: 40 tablet, Rfl: 0 .  predniSONE (DELTASONE) 20 MG tablet, Take 2 tablets (40 mg total) by mouth daily with breakfast. For the next four days, Disp: 8 tablet, Rfl: 0 .  pseudoephedrine (SUDAFED 12 HOUR) 120 MG 12 hr tablet, Take 1 tablet (120 mg total) by mouth 2 (two) times daily. Use twice daily for five days, Disp: 10 tablet, Rfl: 0 .  triamcinolone cream (KENALOG) 0.1 %, Apply 1 application topically 2 (two) times daily. (Patient not taking: Reported on 10/10/2015), Disp: 30 g, Rfl: 0   Exam: Current vital signs: BP (!) 166/95   Pulse 96   Temp 97.8 F (36.6 C) (Oral)   Resp 20   Ht 5\' 1"  (1.549 m)   Wt 95.3 kg   LMP 09/01/2017   SpO2 100%   BMI 39.68 kg/m  Vital signs in last 24 hours: Temp:  [97.8 F (36.6 C)] 97.8 F (36.6 C) (09/19 0851) Pulse Rate:  [91-106] 96 (09/19 0900) Resp:  [20] 20 (09/19 0900) BP: (166)/(88-95) 166/95 (09/19 0900) SpO2:  [98 %-100 %] 100 % (09/19 0900) Weight:  [95.3 kg] 95.3 kg (09/19 0848)  GENERAL: Awake, alert in NAD, tearful  HEENT: - Normocephalic and atraumatic, dry mm, negative for lymph node edema, trachea midline, sclera white, conjunctive pink, negative for nasal drainage, tinnitus, or ear exudates.   LUNGS - Clear to auscultation bilaterally with no wheezes CV - S1S2 RRR, equal pulses bilaterally. ABDOMEN - Soft, nontender, nondistended with normoactive BS Ext: warm, well perfused, intact peripheral pulses, negative for edema  NEURO:  Mental Status: AA&Ox3, speech is clear,   Naming, repetition, fluency, and comprehension intact. Cranial Nerves: PERRL, 3 mm/brisk. EOMI, visual fields full, no facial asymmetry,decreased facial sensation to the right side, temporal and masseter muscles intact, hearing intact, tongue/uvula/soft palate midline Motor: full ROM in all extremities, right side is weaker than left  Tone: is normal and bulk is normal Sensation- decreased sensation to the right-side  Coordination: FTN intact bilaterally,  Gait- steady gate    Labs I have reviewed labs in epic and the results pertinent to this consultation are:  CBC         Component Value Date/Time   WBC 9.8 09/30/2017 0928   RBC 5.03 09/30/2017 0928   HGB 14.3 09/30/2017 0941   HCT 42.0 09/30/2017 0941   PLT 286 09/30/2017 0928   MCV 85.1 09/30/2017 0928   MCH 28.0 09/30/2017 0928  MCHC 32.9 09/30/2017 0928   RDW 13.4 09/30/2017 0928   LYMPHSABS 3.0 09/30/2017 0928   MONOABS 0.4 09/30/2017 0928   EOSABS 0.1 09/30/2017 0928   BASOSABS 0.0 09/30/2017 0928    CMP             Component Value Date/Time   NA 138 09/30/2017 0941   K 3.8 09/30/2017 0941   CL 102 09/30/2017 0941   CO2 23 09/30/2017 0928   GLUCOSE 318 (H) 09/30/2017 0941   BUN 9 09/30/2017 0941   CREATININE 0.40 (L) 09/30/2017 0941   CALCIUM 8.9 09/30/2017 0928   PROT 6.2 (L) 09/30/2017 0928   ALBUMIN 3.5 09/30/2017 0928   AST 15 09/30/2017 0928   ALT 13 09/30/2017 0928   ALKPHOS 74 09/30/2017 0928   BILITOT 0.3 09/30/2017 0928   GFRNONAA >60 09/30/2017 0928   GFRAA >60 09/30/2017 0928    Lipid Panel  No results found for: CHOL, TRIG, HDL, CHOLHDL, VLDL, LDLCALC, LDLDIRECT   Imaging I have reviewed the images obtained:  CT Angio Head W or Wo Contrast: Impression:  1. Motion artifact affecting detail of both carotid bifurcations in the neck. The bilateral cervical carotid arteries otherwise appear normal except for tortuosity.  2. Normal vertebral arteries  aside from tortuosity. 3. Normal intracranial CTA: Negative for large vessel occlusion, negative for intracranial aneurysm. 4. Stable and negative CT appearance of the brain since 1002 hours today.     Impression and Plan  48 y F with PMH of HTN, DM, tobacco abuse, presents with 3 day history of headache and right side weakness, numbness. On examination she would at times close he left eyelid. Mild weakess on right side, however would improve with effort. CTA showed no evidence of dissection and MRI Brain performed was negative for acute stroke.

## 2017-12-13 ENCOUNTER — Emergency Department (HOSPITAL_COMMUNITY)
Admission: EM | Admit: 2017-12-13 | Discharge: 2017-12-14 | Disposition: A | Discharge status: Home / Self Care | Payer: Self-pay | Attending: Emergency Medicine | Admitting: Emergency Medicine

## 2017-12-13 ENCOUNTER — Encounter (HOSPITAL_COMMUNITY): Payer: Self-pay | Admitting: Family Medicine

## 2017-12-13 DIAGNOSIS — K6289 Other specified diseases of anus and rectum: Secondary | ICD-10-CM

## 2017-12-13 DIAGNOSIS — K649 Unspecified hemorrhoids: Secondary | ICD-10-CM | POA: Insufficient documentation

## 2017-12-13 DIAGNOSIS — I10 Essential (primary) hypertension: Secondary | ICD-10-CM | POA: Insufficient documentation

## 2017-12-13 DIAGNOSIS — F17210 Nicotine dependence, cigarettes, uncomplicated: Secondary | ICD-10-CM | POA: Insufficient documentation

## 2017-12-13 DIAGNOSIS — E119 Type 2 diabetes mellitus without complications: Secondary | ICD-10-CM | POA: Insufficient documentation

## 2017-12-13 DIAGNOSIS — Z7984 Long term (current) use of oral hypoglycemic drugs: Secondary | ICD-10-CM | POA: Insufficient documentation

## 2017-12-13 DIAGNOSIS — Z79899 Other long term (current) drug therapy: Secondary | ICD-10-CM | POA: Insufficient documentation

## 2017-12-13 NOTE — ED Triage Notes (Signed)
Patient reports she was moving drinks today at work when she felt a "pop" in right groin/hip. She kept moving the drinks, gradually started experiencing rectal pain. Patient reports she went home took bath, soaked in warm water. Patient reports she feels like there is a small hard ball coming out of her rectum. She has a history of hemorrhoids during pregnancy, but reports this does not feel like that.

## 2017-12-13 NOTE — ED Notes (Addendum)
Pt states that the BP shown is within normal range for her. RN made aware

## 2017-12-14 MED ORDER — HYDROCORTISONE ACETATE 25 MG RE SUPP
25.0000 mg | Freq: Once | RECTAL | Status: AC
  Administered 2017-12-14: 25 mg via RECTAL
  Filled 2017-12-14: qty 1

## 2017-12-14 MED ORDER — LIDOCAINE HCL 2 % EX GEL: 1.0000 "application " | CUTANEOUS | 0 refills | Status: DC | PRN

## 2017-12-14 MED ORDER — HYDROCORTISONE 2.5 % RE CREA: TOPICAL_CREAM | RECTAL | 0 refills | Status: DC

## 2017-12-14 MED ORDER — LIDOCAINE HCL URETHRAL/MUCOSAL 2 % EX GEL
1.0000 "application " | Freq: Once | CUTANEOUS | Status: AC
  Administered 2017-12-14: 1 via TOPICAL
  Filled 2017-12-14: qty 5

## 2017-12-14 NOTE — ED Provider Notes (Signed)
[facility]-EMERGENCY DEPT Provider Note   CSN: [id number] Arrival date & time: 12/13/17  2140     History   Chief Complaint Chief Complaint  Patient presents with  . Rectal Pain    HPI [name] [name] is a 37 y.o. female.  The history is provided by the patient and medical records.     37 y.o. F with hx of anxiety, depression, DM, GERD, HTN, chronic left eye twitch, presenting to the ED for rectal pain.  Patient reports she was moving a pallet of heavy 2 liter drinks today at work when she felt some pain in her right hip.  She stood up and moved around and that went away.  She continued unloading the drinks and started to have some rectal pain that has worsened throughout the evening.    Past Medical History:  Diagnosis Date  . Anxiety   . Back fracture   . Depression   . Diabetes mellitus without complication (HCC)   . Family history of adverse reaction to anesthesia    sister gets nausea and vomitting and itching on her face. Around her nose and mouth.  . Fatty liver disease, nonalcoholic   . GERD (gastroesophageal reflux disease)   . H/O cesarean section complicating pregnancy 04/18/2014  . Hypertension   . Neuromuscular disorder (HCC)    eye twitch on left eye   . Obesity   . Pelvis fracture (HCC)   . S/P cesarean section 06/01/2014  . S/P tubal ligation 06/01/2014    Patient Active Problem List   Diagnosis Date Noted  . S/P cesarean section 06/01/2014  . S/P tubal ligation 06/01/2014  . H/O cesarean section complicating pregnancy 04/18/2014  . Anxiety 03/16/2013  . Smoking 03/16/2013  . Acute chest pain 03/01/2013    Past Surgical History:  Procedure Laterality Date  . CESAREAN SECTION    . CESAREAN SECTION WITH BILATERAL TUBAL LIGATION Bilateral 06/01/2014   Procedure: CESAREAN SECTION WITH BILATERAL TUBAL LIGATION;  Surgeon: Sherian Rein, MD;  Location: [facility];  Service: Obstetrics;  Laterality: Bilateral;  MD requests RNFA  .  CHEST TUBE INSERTION    . TONSILLECTOMY    . TRACHEOSTOMY       OB History    Gravida  2   Para  2   Term  2   Preterm      AB      Living  2     SAB      TAB      Ectopic      Multiple  0   Live Births  2            Home Medications    Prior to Admission medications   Medication Sig Start Date End Date Taking? Authorizing Provider  aspirin-acetaminophen-caffeine (EXCEDRIN MIGRAINE) [phone] MG tablet Take 4 tablets by mouth every 6 (six) hours as needed for headache.    [provider]  butalbital-acetaminophen-caffeine (FIORICET, ESGIC) [phone] MG tablet Take 1-2 tablets by mouth every 6 (six) hours as needed for headache. 09/30/17 09/30/18  [name], PA-C  gabapentin (NEURONTIN) 600 MG tablet Take 600 mg by mouth 2 (two) times daily.    [provider]  ibuprofen (ADVIL,MOTRIN) 200 MG tablet Take 600 mg by mouth every 6 (six) hours as needed for moderate pain.    [provider]  lisinopril (PRINIVIL,ZESTRIL) 40 MG tablet Take 1 tablet (40 mg total) by mouth daily. 09/30/17   [name], PA-C  metFORMIN (GLUCOPHAGE) 1000 MG tablet Take 1 tablet (1,000 mg total) by mouth 2 (two) times daily with a meal. 09/30/17   Fayrene [name], [name], PA-C  naproxen sodium (ALEVE) 220 MG tablet Take 440 mg by mouth as needed (pain).    [provider]    Family History Family History  Problem Relation Age of Onset  . Diabetes Maternal Grandmother   . Diabetes Paternal Grandmother     Social History Social History   Tobacco Use  . Smoking status: Current Every Day Smoker    Packs/day: 1.00    Years: 12.00    Pack years: 12.00    Types: Cigarettes  . Smokeless tobacco: Never Used  Substance Use Topics  . Alcohol use: No  . Drug use: No     Allergies   Patient has no known allergies.   Review of Systems Review of Systems  Gastrointestinal: Positive for rectal pain.  All other systems reviewed and are negative.    Physical  Exam Updated Vital Signs BP (!) 165/116 (BP Location: Left Arm)   Pulse 100   Temp 97.9 F (36.6 C) (Oral)   Resp 15   Ht 5\' 1"  (1.549 m)   Wt 108.9 kg   SpO2 97%   BMI 45.35 kg/m   Physical Exam  Constitutional: She is oriented to person, place, and time. She appears well-developed and well-nourished.  HENT:  Head: Normocephalic and atraumatic.  Mouth/Throat: Oropharynx is clear and moist.  Eyes: Pupils are equal, round, and reactive to light. Conjunctivae and EOM are normal.  Twitching of left eye (chronic)  Neck: Normal range of motion.  Cardiovascular: Normal rate, regular rhythm and normal heart sounds.  Pulmonary/Chest: Effort normal and breath sounds normal. No stridor. No respiratory distress.  Abdominal: Soft. Bowel sounds are normal. There is no tenderness. There is no rebound.  Genitourinary:  Genitourinary Comments: Rectum with several hemorrhoids, the largest of which at the 3 o'clock position; this is locally tender; no active bleeding or signs of thrombosis  Musculoskeletal: Normal range of motion.  Neurological: She is alert and oriented to person, place, and time.  Skin: Skin is warm and dry.  Psychiatric: She has a normal mood and affect.  Nursing note and vitals reviewed.    ED Treatments / Results  Labs (all labs ordered are listed, but only abnormal results are displayed) Labs Reviewed - No data to display  EKG None  Radiology No results found.  Procedures Procedures (including critical care time)  Medications Ordered in ED Medications  lidocaine (XYLOCAINE) 2 % jelly 1 application (1 application Topical Given 12/14/17 0031)  hydrocortisone (ANUSOL-HC) suppository 25 mg (25 mg Rectal Given 12/14/17 0031)     Initial Impression / Assessment and Plan / ED Course  I have reviewed the triage vital signs and the nursing notes.  Pertinent labs & imaging results that were available during my care of the patient were reviewed by me and considered  in my medical decision making (see chart for details).  37 year old female here with rectal pain that began today while moving pallets of 2 L drinks.  On exam she has several hemorrhoids noted, largest of which at the 3 o'clock position.  This is nonthrombosed and does not have any active bleeding but is significantly tender to the touch.  This was likely exacerbated by heavy lifting.  Given Anusol suppository as well as lidocaine jelly here.  Discharged home with same.  Recommended sitz bath, donut pillow, stool softener, avoid heavy  lifting.  She will follow-up with PCP.  She can return here for any new or worsening symptoms.  Final Clinical Impressions(s) / ED Diagnoses   Final diagnoses:  Hemorrhoids, unspecified hemorrhoid type  Rectal pain    ED Discharge Orders         Ordered    lidocaine (XYLOCAINE) 2 % jelly  As needed     12/14/17 0048       12/14/17 0048    hydrocortisone (ANUSOL-HC) 2.5 % rectal cream     12/14/17 0049           Garlon Hatchet, PA-C 12/14/17 0254    Molpus, Jonny Ruiz, MD 12/14/17 [id number]

## 2017-12-14 NOTE — Discharge Instructions (Signed)
Take the prescribed medication as directed.  Can try stool softener to help ease bowel movements.  Also recommend sitz baths-- see attached instructions for this. Follow-up with your primary care doctor. Return to the ED for new or worsening symptoms.

## 2018-02-17 DIAGNOSIS — E119 Type 2 diabetes mellitus without complications: Secondary | ICD-10-CM | POA: Insufficient documentation

## 2018-02-17 DIAGNOSIS — K219 Gastro-esophageal reflux disease without esophagitis: Secondary | ICD-10-CM | POA: Insufficient documentation

## 2018-05-16 ENCOUNTER — Emergency Department (HOSPITAL_COMMUNITY)
Admission: EM | Admit: 2018-05-16 | Discharge: 2018-05-16 | Disposition: A | Discharge status: Home / Self Care | Payer: BLUE CROSS/BLUE SHIELD | Attending: Emergency Medicine | Admitting: Emergency Medicine

## 2018-05-16 ENCOUNTER — Encounter (HOSPITAL_COMMUNITY): Payer: Self-pay

## 2018-05-16 ENCOUNTER — Other Ambulatory Visit: Payer: Self-pay

## 2018-05-16 DIAGNOSIS — K047 Periapical abscess without sinus: Secondary | ICD-10-CM | POA: Diagnosis not present

## 2018-05-16 DIAGNOSIS — Z79899 Other long term (current) drug therapy: Secondary | ICD-10-CM | POA: Insufficient documentation

## 2018-05-16 DIAGNOSIS — K0889 Other specified disorders of teeth and supporting structures: Secondary | ICD-10-CM | POA: Diagnosis present

## 2018-05-16 DIAGNOSIS — Z7984 Long term (current) use of oral hypoglycemic drugs: Secondary | ICD-10-CM | POA: Insufficient documentation

## 2018-05-16 DIAGNOSIS — I10 Essential (primary) hypertension: Secondary | ICD-10-CM | POA: Diagnosis not present

## 2018-05-16 DIAGNOSIS — F17210 Nicotine dependence, cigarettes, uncomplicated: Secondary | ICD-10-CM | POA: Diagnosis not present

## 2018-05-16 MED ORDER — PENICILLIN V POTASSIUM 500 MG PO TABS: 500.0000 mg | ORAL_TABLET | Freq: Four times a day (QID) | ORAL | 0 refills | Status: AC

## 2018-05-16 MED ORDER — OXYCODONE-ACETAMINOPHEN 5-325 MG PO TABS: 1.0000 | ORAL_TABLET | Freq: Four times a day (QID) | ORAL | 0 refills | Status: DC | PRN

## 2018-05-16 NOTE — ED Provider Notes (Signed)
[facility] EMERGENCY DEPARTMENT Provider Note   CSN: [id number] Arrival date & time: 05/16/18  8527    History   Chief Complaint Chief Complaint  Patient presents with  . Dental Pain    HPI [name] [name] is a 38 y.o. female.     HPI   38 year old female presents today with complaints of left-sided dental pain.  Patient notes she had a broken posterior molar tooth on the left upper.  She notes that over the last week she has had worsening pain, she noted swelling 3 days ago to the left upper gumline.  She denies any fever, difficulty talking, swelling to the neck.  Notes she has been using Tylenol, aspirin and ibuprofen at home without improvement in her symptoms.  She does not have a Education officer, community.  Past Medical History:  Diagnosis Date  . Anxiety   . Back fracture   . Depression   . Diabetes mellitus without complication (HCC)   . Family history of adverse reaction to anesthesia    sister gets nausea and vomitting and itching on her face. Around her nose and mouth.  . Fatty liver disease, nonalcoholic   . GERD (gastroesophageal reflux disease)   . H/O cesarean section complicating pregnancy 04/18/2014  . Hypertension   . Neuromuscular disorder (HCC)    eye twitch on left eye   . Obesity   . Pelvis fracture (HCC)   . S/P cesarean section 06/01/2014  . S/P tubal ligation 06/01/2014    Patient Active Problem List   Diagnosis Date Noted  . S/P cesarean section 06/01/2014  . S/P tubal ligation 06/01/2014  . H/O cesarean section complicating pregnancy 04/18/2014  . Anxiety 03/16/2013  . Smoking 03/16/2013  . Acute chest pain 03/01/2013    Past Surgical History:  Procedure Laterality Date  . CESAREAN SECTION    . CESAREAN SECTION WITH BILATERAL TUBAL LIGATION Bilateral 06/01/2014   Procedure: CESAREAN SECTION WITH BILATERAL TUBAL LIGATION;  Surgeon: Sherian Rein, MD;  Location: [facility];  Service: Obstetrics;  Laterality: Bilateral;  MD requests RNFA   . CHEST TUBE INSERTION    . TONSILLECTOMY    . TRACHEOSTOMY       OB History    Gravida  2   Para  2   Term  2   Preterm      AB      Living  2     SAB      TAB      Ectopic      Multiple  0   Live Births  2            Home Medications    Prior to Admission medications   Medication Sig Start Date End Date Taking? Authorizing Provider  aspirin-acetaminophen-caffeine (EXCEDRIN MIGRAINE) [phone] MG tablet Take 4 tablets by mouth every 6 (six) hours as needed for headache.    [provider]  butalbital-acetaminophen-caffeine (FIORICET, ESGIC) [phone] MG tablet Take 1-2 tablets by mouth every 6 (six) hours as needed for headache. 09/30/17 09/30/18  [name], PA-C  gabapentin (NEURONTIN) 600 MG tablet Take 600 mg by mouth 2 (two) times daily.    [provider]  hydrocortisone (ANUSOL-HC) 2.5 % rectal cream Apply rectally 2 times daily 12/14/17   Garlon Hatchet, PA-C  ibuprofen (ADVIL,MOTRIN) 200 MG tablet Take 600 mg by mouth every 6 (six) hours as needed for moderate pain.    [provider]  lidocaine (XYLOCAINE) 2 % jelly 1  application by Other route as needed. 12/14/17   Garlon Hatchet, PA-C  lisinopril (PRINIVIL,ZESTRIL) 40 MG tablet Take 1 tablet (40 mg total) by mouth daily. 09/30/17   [name], PA-C  metFORMIN (GLUCOPHAGE) 1000 MG tablet Take 1 tablet (1,000 mg total) by mouth 2 (two) times daily with a meal. 09/30/17   [name], PA-C  naproxen sodium (ALEVE) 220 MG tablet Take 440 mg by mouth as needed (pain).    [provider]  oxyCODONE-acetaminophen (PERCOCET/ROXICET) 5-325 MG tablet Take 1 tablet by mouth every 6 (six) hours as needed. 05/16/18   Regene Mccarthy, Tinnie Gens, PA-C  penicillin v potassium (VEETID) 500 MG tablet Take 1 tablet (500 mg total) by mouth 4 (four) times daily for 7 days. 05/16/18 05/23/18  Eyvonne Mechanic, PA-C    Family History Family History  Problem Relation Age of Onset  . Diabetes Maternal  Grandmother   . Diabetes Paternal Grandmother     Social History Social History   Tobacco Use  . Smoking status: Current Every Day Smoker    Packs/day: 1.00    Years: 12.00    Pack years: 12.00    Types: Cigarettes  . Smokeless tobacco: Never Used  Substance Use Topics  . Alcohol use: No  . Drug use: No     Allergies   Patient has no known allergies.   Review of Systems Review of Systems  All other systems reviewed and are negative.    Physical Exam Updated Vital Signs BP (!) 158/96 (BP Location: Right Arm)   Pulse 77   Temp 98.5 F (36.9 C) (Oral)   Resp 18   SpO2 100%   Physical Exam Vitals signs and nursing note reviewed.  Constitutional:      Appearance: She is well-developed.  HENT:     Head: Normocephalic and atraumatic.     Comments: Swelling in his upper left gumline, no fluctuance, no discharge, numerous dental caries throughout full active range of motion of the jaw floor the mouth soft neck supple no lymphadenopathy Eyes:     General: No scleral icterus.       Right eye: No discharge.        Left eye: No discharge.     Conjunctiva/sclera: Conjunctivae normal.     Pupils: Pupils are equal, round, and reactive to light.  Neck:     Musculoskeletal: Normal range of motion.     Vascular: No JVD.     Trachea: No tracheal deviation.  Pulmonary:     Effort: Pulmonary effort is normal.     Breath sounds: No stridor.  Neurological:     Mental Status: She is alert and oriented to person, place, and time.     Coordination: Coordination normal.  Psychiatric:        Behavior: Behavior normal.        Thought Content: Thought content normal.        Judgment: Judgment normal.      ED Treatments / Results  Labs (all labs ordered are listed, but only abnormal results are displayed) Labs Reviewed - No data to display  EKG None  Radiology No results found.  Procedures Procedures (including critical care time)  Medications Ordered in ED  Medications - No data to display   Initial Impression / Assessment and Plan / ED Course  I have reviewed the triage vital signs and the nursing notes.  Pertinent labs & imaging results that were available during my care of the patient were reviewed by  me and considered in my medical decision making (see chart for details).        38 year old female presents today with dental infection.  No drainable abscess on my exam.  Patient will be given pain medication antibiotics.  Discussed precautions for medications.  She will follow-up as an outpatient with oral surgery.  She is given strict return precautions.  She verbalized understanding and agreement to today's plan.  Final Clinical Impressions(s) / ED Diagnoses   Final diagnoses:  Dental infection    ED Discharge Orders         Ordered    penicillin v potassium (VEETID) 500 MG tablet  4 times daily     05/16/18 1022    oxyCODONE-acetaminophen (PERCOCET/ROXICET) 5-325 MG tablet  Every 6 hours PRN     05/16/18 1022           HedgesTinnie Gens, Enes Rokosz, PA-C 05/16/18 1025    Melene PlanFloyd, Dan, DO 05/16/18 1517

## 2018-05-16 NOTE — Discharge Instructions (Addendum)
Please read attached information. If you experience any new or worsening signs or symptoms please return to the emergency room for evaluation. Please follow-up with your primary care provider or specialist as discussed. Please use medication prescribed only as directed and discontinue taking if you have any concerning signs or symptoms.   °

## 2018-05-16 NOTE — ED Triage Notes (Signed)
Pt reports dental pain left upper wisdom tooth that broke Friday. She states she has been taking aspirin persistently with no relief. Pt alert and oriented.

## 2018-07-11 ENCOUNTER — Encounter (HOSPITAL_COMMUNITY): Payer: Self-pay | Admitting: *Deleted

## 2018-07-11 ENCOUNTER — Emergency Department (HOSPITAL_COMMUNITY)
Admission: EM | Admit: 2018-07-11 | Discharge: 2018-07-12 | Disposition: A | Discharge status: Other Acute Inpatient Hospital | Payer: BLUE CROSS/BLUE SHIELD | Source: Home / Self Care | Attending: Emergency Medicine | Admitting: Emergency Medicine

## 2018-07-11 ENCOUNTER — Other Ambulatory Visit: Payer: Self-pay

## 2018-07-11 DIAGNOSIS — Z046 Encounter for general psychiatric examination, requested by authority: Secondary | ICD-10-CM | POA: Insufficient documentation

## 2018-07-11 DIAGNOSIS — F17210 Nicotine dependence, cigarettes, uncomplicated: Secondary | ICD-10-CM | POA: Insufficient documentation

## 2018-07-11 DIAGNOSIS — R45851 Suicidal ideations: Secondary | ICD-10-CM | POA: Insufficient documentation

## 2018-07-11 DIAGNOSIS — E119 Type 2 diabetes mellitus without complications: Secondary | ICD-10-CM | POA: Insufficient documentation

## 2018-07-11 DIAGNOSIS — F329 Major depressive disorder, single episode, unspecified: Secondary | ICD-10-CM | POA: Diagnosis not present

## 2018-07-11 DIAGNOSIS — Z7984 Long term (current) use of oral hypoglycemic drugs: Secondary | ICD-10-CM | POA: Insufficient documentation

## 2018-07-11 DIAGNOSIS — I10 Essential (primary) hypertension: Secondary | ICD-10-CM | POA: Insufficient documentation

## 2018-07-11 DIAGNOSIS — Z20828 Contact with and (suspected) exposure to other viral communicable diseases: Secondary | ICD-10-CM | POA: Insufficient documentation

## 2018-07-11 DIAGNOSIS — Z79899 Other long term (current) drug therapy: Secondary | ICD-10-CM | POA: Insufficient documentation

## 2018-07-11 DIAGNOSIS — F332 Major depressive disorder, recurrent severe without psychotic features: Secondary | ICD-10-CM | POA: Insufficient documentation

## 2018-07-11 LAB — RAPID URINE DRUG SCREEN, HOSP PERFORMED
Amphetamines: NOT DETECTED
Barbiturates: NOT DETECTED
Benzodiazepines: NOT DETECTED
Cocaine: NOT DETECTED
Opiates: NOT DETECTED
Tetrahydrocannabinol: NOT DETECTED

## 2018-07-11 LAB — CBC WITH DIFFERENTIAL/PLATELET
Abs Immature Granulocytes: 0.03 10*3/uL (ref 0.00–0.07)
Basophils Absolute: 0 10*3/uL (ref 0.0–0.1)
Basophils Relative: 0 %
Eosinophils Absolute: 0 10*3/uL (ref 0.0–0.5)
Eosinophils Relative: 0 %
HCT: 44 % (ref 36.0–46.0)
Hemoglobin: 14.3 g/dL (ref 12.0–15.0)
Immature Granulocytes: 0 %
Lymphocytes Relative: 21 %
Lymphs Abs: 2.7 10*3/uL (ref 0.7–4.0)
MCH: 28.4 pg (ref 26.0–34.0)
MCHC: 32.5 g/dL (ref 30.0–36.0)
MCV: 87.3 fL (ref 80.0–100.0)
Monocytes Absolute: 0.5 10*3/uL (ref 0.1–1.0)
Monocytes Relative: 4 %
Neutro Abs: 9.7 10*3/uL — ABNORMAL HIGH (ref 1.7–7.7)
Neutrophils Relative %: 75 %
Platelets: 306 10*3/uL (ref 150–400)
RBC: 5.04 MIL/uL (ref 3.87–5.11)
RDW: 13.7 % (ref 11.5–15.5)
WBC: 12.9 10*3/uL — ABNORMAL HIGH (ref 4.0–10.5)
nRBC: 0 % (ref 0.0–0.2)

## 2018-07-11 LAB — COMPREHENSIVE METABOLIC PANEL
ALT: 17 U/L (ref 0–44)
AST: 15 U/L (ref 15–41)
Albumin: 4.1 g/dL (ref 3.5–5.0)
Alkaline Phosphatase: 62 U/L (ref 38–126)
Anion gap: 11 (ref 5–15)
BUN: 12 mg/dL (ref 6–20)
CO2: 22 mmol/L (ref 22–32)
Calcium: 9.1 mg/dL (ref 8.9–10.3)
Chloride: 105 mmol/L (ref 98–111)
Creatinine, Ser: 0.6 mg/dL (ref 0.44–1.00)
GFR calc Af Amer: >60 mL/min (ref >60)
GFR calc non Af Amer: >60 mL/min (ref >60)
Glucose, Bld: 225 mg/dL — ABNORMAL HIGH (ref 70–99)
Potassium: 3.9 mmol/L (ref 3.5–5.1)
Sodium: 138 mmol/L (ref 135–145)
Total Bilirubin: 0.2 mg/dL — ABNORMAL LOW (ref 0.3–1.2)
Total Protein: 7.4 g/dL (ref 6.5–8.1)

## 2018-07-11 LAB — I-STAT BETA HCG BLOOD, ED (MC, WL, AP ONLY): I-stat hCG, quantitative: <5 m[IU]/mL (ref <5)

## 2018-07-11 LAB — SARS CORONAVIRUS 2 BY RT PCR (HOSPITAL ORDER, PERFORMED IN ~~LOC~~ HOSPITAL LAB): SARS Coronavirus 2: NEGATIVE

## 2018-07-11 LAB — ETHANOL: Alcohol, Ethyl (B): <10 mg/dL (ref <10)

## 2018-07-11 MED ORDER — LORAZEPAM 1 MG PO TABS: 0.0000 mg | ORAL_TABLET | Freq: Two times a day (BID) | ORAL | Status: DC

## 2018-07-11 MED ORDER — LORAZEPAM 2 MG/ML IJ SOLN: 0.0000 mg | Freq: Two times a day (BID) | INTRAMUSCULAR | Status: DC

## 2018-07-11 MED ORDER — LORAZEPAM 2 MG/ML IJ SOLN
0.0000 mg | Freq: Four times a day (QID) | INTRAMUSCULAR | Status: DC
  Administered 2018-07-12: 1 mg via INTRAVENOUS

## 2018-07-11 MED ORDER — VITAMIN B-1 100 MG PO TABS
100.0000 mg | ORAL_TABLET | Freq: Every day | ORAL | Status: DC
  Administered 2018-07-12: 10:00:00 100 mg via ORAL
  Filled 2018-07-11: qty 1

## 2018-07-11 MED ORDER — THIAMINE HCL 100 MG/ML IJ SOLN: 100.0000 mg | Freq: Every day | INTRAMUSCULAR | Status: DC

## 2018-07-11 MED ORDER — LORAZEPAM 1 MG PO TABS
0.0000 mg | ORAL_TABLET | Freq: Four times a day (QID) | ORAL | Status: DC
  Filled 2018-07-11: qty 1

## 2018-07-11 NOTE — ED Notes (Signed)
Pt originally was combative, refusing treatment. Security had to assist in changing into scrubs. Once that was complete pt calmed down and voluntarily let me draw her labs and she collected a urine. Pt given snack, resting currently. Does not want to take medication ordered because does not want to be sleepy when talks to TTS.

## 2018-07-11 NOTE — ED Notes (Signed)
Bed: GO77 Expected date:  Expected time:  Means of arrival:  Comments: IVC

## 2018-07-11 NOTE — ED Provider Notes (Signed)
[facility]-EMERGENCY DEPT Provider Note   CSN: [id number] Arrival date & time: 07/11/18  [id number]    History   Chief Complaint Chief Complaint  Patient presents with  . Suicidal    HPI [name] [name] is a 38 y.o. female.     HPI   [name] [name] is a 38 y.o. female, with a history of DM, anxiety, HTN, GERD, presenting to the ED under IVC.  In short, patient reportedly made statements to her mother stating she wanted to kill herself and had plans to do so tonight.  [facility]. Patient would not give much information, but does state, "I just do not want to be here anymore.  I do not know why that is anyone else's business.  Tired of doing for everyone else all the time."   Plan: Patient refused to answer  Support system: Patient refused to answer  Previous Attempts: Patient refused to answer  Drug/alcohol use: Patient refused to answer  Medication compliance: States she has not taken her Seroquel "for a while," but would not define this time period.  Medication changes: Patient refused to answer  Physical complaints: Patient refused to answer    Past Medical History:  Diagnosis Date  . Anxiety   . Back fracture   . Depression   . Diabetes mellitus without complication (HCC)   . Family history of adverse reaction to anesthesia    sister gets nausea and vomitting and itching on her face. Around her nose and mouth.  . Fatty liver disease, nonalcoholic   . GERD (gastroesophageal reflux disease)   . H/O cesarean section complicating pregnancy 04/18/2014  . Hypertension   . Neuromuscular disorder (HCC)    eye twitch on left eye   . Obesity   . Pelvis fracture (HCC)   . S/P cesarean section 06/01/2014  . S/P tubal ligation 06/01/2014    Patient Active Problem List   Diagnosis Date Noted  . S/P cesarean section 06/01/2014  . S/P tubal ligation 06/01/2014  . H/O cesarean  section complicating pregnancy 04/18/2014  . Anxiety 03/16/2013  . Smoking 03/16/2013  . Acute chest pain 03/01/2013    Past Surgical History:  Procedure Laterality Date  . CESAREAN SECTION    . CESAREAN SECTION WITH BILATERAL TUBAL LIGATION Bilateral 06/01/2014   Procedure: CESAREAN SECTION WITH BILATERAL TUBAL LIGATION;  Surgeon: Sherian ReinJody Bovard-Stuckert, MD;  Location: [facility];  Service: Obstetrics;  Laterality: Bilateral;  MD requests RNFA  . CHEST TUBE INSERTION    . TONSILLECTOMY    . TRACHEOSTOMY       OB History    Gravida  2   Para  2   Term  2   Preterm      AB      Living  2     SAB      TAB      Ectopic      Multiple  0   Live Births  2            Home Medications    Prior to Admission medications   Medication Sig Start Date End Date Taking? Authorizing Provider  aspirin-acetaminophen-caffeine (EXCEDRIN MIGRAINE) [phone] MG tablet Take 4 tablets by mouth every 6 (six) hours as needed for headache.    [provider]  butalbital-acetaminophen-caffeine (FIORICET, ESGIC) [phone] MG tablet Take 1-2 tablets by mouth every 6 (six) hours as needed for headache. 09/30/17 09/30/18  Laveda Normanran,  Greta DoomBowie, PA-C  gabapentin (NEURONTIN) 600 MG tablet Take 600 mg by mouth 2 (two) times daily.    [provider]  hydrocortisone (ANUSOL-HC) 2.5 % rectal cream Apply rectally 2 times daily 12/14/17   Garlon HatchetSanders, Lisa M, PA-C  ibuprofen (ADVIL,MOTRIN) 200 MG tablet Take 600 mg by mouth every 6 (six) hours as needed for moderate pain.    [provider]  lidocaine (XYLOCAINE) 2 % jelly 1 application by Other route as needed. 12/14/17   Garlon HatchetSanders, Lisa M, PA-C  lisinopril (PRINIVIL,ZESTRIL) 40 MG tablet Take 1 tablet (40 mg total) by mouth daily. 09/30/17   Fayrene [name], [name], PA-C  metFORMIN (GLUCOPHAGE) 1000 MG tablet Take 1 tablet (1,000 mg total) by mouth 2 (two) times daily with a meal. 09/30/17   Fayrene [name], [name], PA-C  naproxen sodium (ALEVE) 220 MG tablet Take 440 mg  by mouth as needed (pain).    [provider]  oxyCODONE-acetaminophen (PERCOCET/ROXICET) 5-325 MG tablet Take 1 tablet by mouth every 6 (six) hours as needed. 05/16/18   Eyvonne MechanicHedges, Jeffrey, PA-C    Family History Family History  Problem Relation Age of Onset  . Diabetes Maternal Grandmother   . Diabetes Paternal Grandmother     Social History Social History   Tobacco Use  . Smoking status: Current Every Day Smoker    Packs/day: 2.00    Years: 12.00    Pack years: 24.00    Types: Cigarettes  . Smokeless tobacco: Never Used  Substance Use Topics  . Alcohol use: No  . Drug use: No     Allergies   Patient has no known allergies.   Review of Systems Review of Systems  Constitutional: Negative for fever.  Respiratory: Negative for shortness of breath.   Cardiovascular: Negative for chest pain.  Gastrointestinal: Negative for abdominal pain, nausea and vomiting.  Psychiatric/Behavioral: Positive for suicidal ideas.       Under IVC  All other systems reviewed and are negative.    Physical Exam Updated Vital Signs BP (!) 145/81 (BP Location: Left Arm)   Pulse 84   Temp 98.2 F (36.8 C) (Oral)   Resp 18   Ht 5\' 1"  (1.549 m)   Wt 99.8 kg   LMP 05/13/2018 (Approximate)   SpO2 90%   Breastfeeding No   BMI 41.57 kg/m   Physical Exam Vitals signs and nursing note reviewed.  Constitutional:      General: She is not in acute distress.    Appearance: She is well-developed. She is not diaphoretic.  HENT:     Head: Normocephalic and atraumatic.     Mouth/Throat:     Mouth: Mucous membranes are moist.     Pharynx: Oropharynx is clear.  Eyes:     Conjunctiva/sclera: Conjunctivae normal.  Neck:     Musculoskeletal: Neck supple.  Cardiovascular:     Rate and Rhythm: Normal rate and regular rhythm.     Pulses: Normal pulses.          Radial pulses are 2+ on the right side and 2+ on the left side.       Posterior tibial pulses are 2+ on the right side and 2+ on  the left side.  Pulmonary:     Effort: Pulmonary effort is normal. No respiratory distress.  Abdominal:     Tenderness: There is no guarding.  Musculoskeletal:     Right lower leg: No edema.     Left lower leg: No edema.  Lymphadenopathy:     Cervical:  No cervical adenopathy.  Skin:    General: Skin is warm and dry.  Neurological:     Mental Status: She is alert and oriented to person, place, and time.  Psychiatric:        Mood and Affect: Affect is tearful.        Thought Content: Thought content includes suicidal ideation.     Comments: Intermittently cooperative.  Mostly, patient just refuses to answer questions.      ED Treatments / Results  Labs (all labs ordered are listed, but only abnormal results are displayed) Labs Reviewed  COMPREHENSIVE METABOLIC PANEL - Abnormal; Notable for the following components:      Result Value   Glucose, Bld 225 (*)    Total Bilirubin 0.2 (*)    All other components within normal limits  CBC WITH DIFFERENTIAL/PLATELET - Abnormal; Notable for the following components:   WBC 12.9 (*)    Neutro Abs 9.7 (*)    All other components within normal limits  SARS CORONAVIRUS 2 (HOSPITAL ORDER, [place] [facility])  ETHANOL  RAPID URINE DRUG SCREEN, HOSP PERFORMED  I-STAT BETA HCG BLOOD, ED ([facility], [facility], [facility] ONLY)  I-STAT BETA HCG BLOOD, ED ([facility], [facility], [facility] ONLY)  CBG MONITORING, ED    EKG None  Radiology No results found.  Procedures Procedures (including critical care time)  Medications Ordered in ED Medications  LORazepam (ATIVAN) injection 0-4 mg ( Intravenous See Alternative 07/11/18 2300)    Or  LORazepam (ATIVAN) tablet 0-4 mg (0 mg Oral Not Given 07/11/18 2300)  LORazepam (ATIVAN) injection 0-4 mg (has no administration in time range)    Or  LORazepam (ATIVAN) tablet 0-4 mg (has no administration in time range)  thiamine (VITAMIN B-1) tablet 100 mg (100 mg Oral Not Given 07/11/18 2300)    Or  thiamine (B-1) injection  100 mg ( Intravenous See Alternative 07/11/18 2300)     Initial Impression / Assessment and Plan / ED Course  I have reviewed the triage vital signs and the nursing notes.  Pertinent labs & imaging results that were available during my care of the patient were reviewed by me and considered in my medical decision making (see chart for details).  Clinical Course as of Jul 11 200  Mon Jul 11, 2018  2330 Patient is not complaining of any shortness of breath or chest pain.  I do not think this is a true SPO2 reading.  She states she thinks these SPO2 readings were taken while the blood pressure was also being measured. I have asked the nurse to reevaluate probe placement.   SpO2: 90 % [SJ]  Tue Jul 12, 2018  0159 Currently speaking with TTS counselor.   [SJ]    Clinical Course User Index [SJ] Clayson Riling C, PA-C       Patient presents under IVC due to concerns she may be a threat to herself. She refused to answer most of my questions.  She seems to know what is going on around her and is alert and oriented. Mild leukocytosis is nonspecific at this time.  She does have some hyperglycemia without elevated anion gap. Medically cleared.  Relevant home medications ordered.  TTS consult pending.   Final Clinical Impressions(s) / ED Diagnoses   Final diagnoses:  Involuntary commitment    ED Discharge Orders    None       Layla Maw 07/12/18 0209    Milton Ferguson, MD 07/12/18 1252

## 2018-07-12 ENCOUNTER — Inpatient Hospital Stay (HOSPITAL_COMMUNITY)
Admission: AD | Admit: 2018-07-12 | Discharge: 2018-07-15 | DRG: 881 | Disposition: A | Discharge status: Home / Self Care | Payer: BLUE CROSS/BLUE SHIELD | Attending: Psychiatry | Admitting: Psychiatry

## 2018-07-12 ENCOUNTER — Encounter (HOSPITAL_COMMUNITY): Payer: Self-pay

## 2018-07-12 ENCOUNTER — Encounter (HOSPITAL_COMMUNITY): Payer: Self-pay | Admitting: Registered Nurse

## 2018-07-12 DIAGNOSIS — G4700 Insomnia, unspecified: Secondary | ICD-10-CM | POA: Diagnosis present

## 2018-07-12 DIAGNOSIS — E119 Type 2 diabetes mellitus without complications: Secondary | ICD-10-CM | POA: Diagnosis present

## 2018-07-12 DIAGNOSIS — F17210 Nicotine dependence, cigarettes, uncomplicated: Secondary | ICD-10-CM | POA: Diagnosis present

## 2018-07-12 DIAGNOSIS — Z1159 Encounter for screening for other viral diseases: Secondary | ICD-10-CM

## 2018-07-12 DIAGNOSIS — F322 Major depressive disorder, single episode, severe without psychotic features: Secondary | ICD-10-CM | POA: Diagnosis not present

## 2018-07-12 DIAGNOSIS — R45851 Suicidal ideations: Secondary | ICD-10-CM | POA: Diagnosis present

## 2018-07-12 DIAGNOSIS — F329 Major depressive disorder, single episode, unspecified: Principal | ICD-10-CM | POA: Diagnosis present

## 2018-07-12 DIAGNOSIS — I10 Essential (primary) hypertension: Secondary | ICD-10-CM | POA: Diagnosis present

## 2018-07-12 LAB — URINALYSIS, ROUTINE W REFLEX MICROSCOPIC
Bilirubin Urine: NEGATIVE
Glucose, UA: >=500 mg/dL — AB
Hgb urine dipstick: NEGATIVE
Ketones, ur: NEGATIVE mg/dL
Leukocytes,Ua: NEGATIVE
Nitrite: NEGATIVE
Protein, ur: NEGATIVE mg/dL
Specific Gravity, Urine: 1.015 (ref 1.005–1.030)
pH: 5 (ref 5.0–8.0)

## 2018-07-12 LAB — LIPID PANEL
Cholesterol: 172 mg/dL (ref 0–200)
HDL: 31 mg/dL — ABNORMAL LOW (ref >40)
LDL Cholesterol: 91 mg/dL (ref 0–99)
Total CHOL/HDL Ratio: 5.5 RATIO
Triglycerides: 251 mg/dL — ABNORMAL HIGH (ref <150)
VLDL: 50 mg/dL — ABNORMAL HIGH (ref 0–40)

## 2018-07-12 LAB — CBG MONITORING, ED: Glucose-Capillary: 126 mg/dL — ABNORMAL HIGH (ref 70–99)

## 2018-07-12 LAB — HEMOGLOBIN A1C
Hgb A1c MFr Bld: 8.1 % — ABNORMAL HIGH (ref 4.8–5.6)
Mean Plasma Glucose: 185.77 mg/dL

## 2018-07-12 LAB — TSH: TSH: 0.759 u[IU]/mL (ref 0.350–4.500)

## 2018-07-12 LAB — POC URINE PREG, ED: Preg Test, Ur: NEGATIVE

## 2018-07-12 MED ORDER — GABAPENTIN 300 MG PO CAPS
600.0000 mg | ORAL_CAPSULE | Freq: Two times a day (BID) | ORAL | Status: DC
  Administered 2018-07-12 – 2018-07-13 (×2): 600 mg via ORAL
  Filled 2018-07-12 (×8): qty 2

## 2018-07-12 MED ORDER — METFORMIN HCL 500 MG PO TABS
500.0000 mg | ORAL_TABLET | Freq: Two times a day (BID) | ORAL | Status: DC
  Administered 2018-07-13: 08:00:00 500 mg via ORAL
  Filled 2018-07-12 (×6): qty 1

## 2018-07-12 MED ORDER — ALBUTEROL SULFATE HFA 108 (90 BASE) MCG/ACT IN AERS: 2.0000 | INHALATION_SPRAY | Freq: Three times a day (TID) | RESPIRATORY_TRACT | Status: DC | PRN

## 2018-07-12 MED ORDER — GABAPENTIN 300 MG PO CAPS
600.0000 mg | ORAL_CAPSULE | Freq: Two times a day (BID) | ORAL | Status: DC
  Administered 2018-07-12 (×2): 600 mg via ORAL
  Filled 2018-07-12 (×2): qty 2

## 2018-07-12 MED ORDER — LISINOPRIL 40 MG PO TABS
40.0000 mg | ORAL_TABLET | Freq: Every day | ORAL | Status: DC
  Administered 2018-07-13 – 2018-07-15 (×3): 40 mg via ORAL
  Filled 2018-07-12: qty 1
  Filled 2018-07-12: qty 2
  Filled 2018-07-12 (×4): qty 1

## 2018-07-12 MED ORDER — NICOTINE 14 MG/24HR TD PT24
14.0000 mg | MEDICATED_PATCH | Freq: Every day | TRANSDERMAL | Status: DC
  Administered 2018-07-13 – 2018-07-14 (×2): 14 mg via TRANSDERMAL
  Filled 2018-07-12 (×7): qty 1

## 2018-07-12 MED ORDER — METFORMIN HCL 500 MG PO TABS
1000.0000 mg | ORAL_TABLET | Freq: Two times a day (BID) | ORAL | Status: DC
  Administered 2018-07-12 (×2): 1000 mg via ORAL
  Filled 2018-07-12 (×3): qty 2

## 2018-07-12 MED ORDER — TRAZODONE HCL 50 MG PO TABS
50.0000 mg | ORAL_TABLET | Freq: Every evening | ORAL | Status: DC | PRN
  Administered 2018-07-12 – 2018-07-14 (×3): 50 mg via ORAL
  Filled 2018-07-12 (×12): qty 1

## 2018-07-12 MED ORDER — ATORVASTATIN CALCIUM 10 MG PO TABS
10.0000 mg | ORAL_TABLET | Freq: Every day | ORAL | Status: DC
  Administered 2018-07-12 – 2018-07-15 (×4): 10 mg via ORAL
  Filled 2018-07-12 (×8): qty 1

## 2018-07-12 MED ORDER — HYDROXYZINE HCL 25 MG PO TABS
25.0000 mg | ORAL_TABLET | Freq: Three times a day (TID) | ORAL | Status: DC | PRN
  Administered 2018-07-12 – 2018-07-14 (×3): 25 mg via ORAL
  Filled 2018-07-12 (×3): qty 1

## 2018-07-12 MED ORDER — LISINOPRIL 20 MG PO TABS
40.0000 mg | ORAL_TABLET | Freq: Every day | ORAL | Status: DC
  Administered 2018-07-12: 40 mg via ORAL
  Filled 2018-07-12 (×2): qty 2

## 2018-07-12 NOTE — ED Notes (Signed)
Blood draw attempted x2 , partial labs obtained, lab call to complete the order and blood draw.

## 2018-07-12 NOTE — Tx Team (Signed)
Initial Treatment Plan 07/12/2018 6:54 PM [name] [name] NLZ:[id number]    PATIENT STRESSORS: Marital or family conflict Medication change or noncompliance   PATIENT STRENGTHS: Average or above average intelligence Capable of independent living General fund of knowledge Physical Health   PATIENT IDENTIFIED PROBLEMS: "not feel this way anymore"  Suicide Risk  Depression                 DISCHARGE CRITERIA:  Ability to meet basic life and health needs Adequate post-discharge living arrangements Improved stabilization in mood, thinking, and/or behavior Medical problems require only outpatient monitoring Motivation to continue treatment in a less acute level of care Need for constant or close observation no longer present Reduction of life-threatening or endangering symptoms to within safe limits Safe-care adequate arrangements made Verbal commitment to aftercare and medication compliance  PRELIMINARY DISCHARGE PLAN: Outpatient therapy  PATIENT/FAMILY INVOLVEMENT: This treatment plan has been presented to and reviewed with the patient, [name] [name].  The patient and family have been given the opportunity to ask questions and make suggestions.  Annia Friendly, RN 07/12/2018, 6:54 PM

## 2018-07-12 NOTE — Discharge Summary (Addendum)
  Patient to be transferred to [facility] for inpatient psychiatric treatment  Shuvon B. Rankin, NP  Patient seen by telemedicine, chart reviewed and case discussed with the physician extender and developed treatment plan. Reviewed the information documented and agree with the treatment plan.  Buford Dresser, DO 07/12/18 7:08 PM

## 2018-07-12 NOTE — ED Notes (Signed)
Pt off unit to [facility] per provider. Pt alert, calm. Cooperative, guarded, no s/s of distress. Pt DC information given to sheriff for [facility] facility. Pt belongings given to sheriff for [facility] facility. Pt ambulatory off unit. Pt transported by sheriff.

## 2018-07-12 NOTE — Progress Notes (Signed)
Adult Psychoeducational Group Note  Date:  07/12/2018 Time:  8:54 PM  Group Topic/Focus:  Wrap-Up Group:   The focus of this group is to help patients review their daily goal of treatment and discuss progress on daily workbooks.  Participation Level:  Minimal  Participation Quality:  Appropriate  Affect:  Flat  Cognitive:  Lacking  Insight: Appropriate  Engagement in Group:  Lacking  Modes of Intervention:  Discussion  Additional Comments:  Patient attended group and said she had just arrived.   Ethon Wymer W Cordarius Benning 09/27/3844, 8:54 PM

## 2018-07-12 NOTE — Progress Notes (Signed)
Patient ID: [name] [name], female   DOB: 1980/06/29, 38 y.o.   MRN: [id number]  Admission Note  Patient is tearful during the admission and does endorse passive SI. Patient contracts for safety on the unit but states she is tired of feeling this way and is hopeless about getting better. Patient states, "if I did something for myself like everyone says I should, I wouldn't be here anymore". Patient with history of cutting. Patient denies HI/AVH. Patient denies illicit drug use or legal issues. Patient states she wants to see her 64 year old son grow up, but isn't sure she will. Patient states her mom is a good support to her.  Skin assessment was completed and unremarkable except for tattoos and superficial cuts to her arms/thighs. Patient belongings searched with no contraband found. Belongings secured in locker. Vital signs obtained. Dinner tray provided.   A) Plan of care, unit policies and patient expectations were explained. Written consents obtained. Patient oriented to the unit and their room. Patient placed on standard q15 safety checks. Low fall risk precautions initiated and reviewed with patient.   R) Patient is in no acute distress and verbalizes understanding of information provided. Patient with no concerns at this time. Patient contracts for safety with staff on the unit.
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
## 2018-07-12 NOTE — Progress Notes (Signed)
[place] NOVEL CORONAVIRUS (COVID-19) DAILY CHECK-OFF SYMPTOMS - answer yes or no to each - every day NO YES  Have you had a fever in the past 24 hours?  . Fever (Temp > 37.80C / 100F) X   Have you had any of these symptoms in the past 24 hours? . New Cough .  Sore Throat  .  Shortness of Breath .  Difficulty Breathing .  Unexplained Body Aches   X   Have you had any one of these symptoms in the past 24 hours not related to allergies?   . Runny Nose .  Nasal Congestion .  Sneezing   X   If you have had runny nose, nasal congestion, sneezing in the past 24 hours, has it worsened?  X   EXPOSURES - check yes or no X   Have you traveled outside the state in the past 14 days?  X   Have you been in contact with someone with a confirmed diagnosis of COVID-19 or PUI in the past 14 days without wearing appropriate PPE?  X   Have you been living in the same home as a person with confirmed diagnosis of COVID-19 or a PUI (household contact)?    X   Have you been diagnosed with COVID-19?    X              What to do next: Answered NO to all: Answered YES to anything:   Proceed with unit schedule Follow the [facility] Inpatient Flowsheet.   

## 2018-07-12 NOTE — BH Assessment (Addendum)
[facility] Assessment Progress Note  Per Buford Dresser, DO, this pt requires psychiatric hospitalization.  Letitia Libra, RN, [facility] has assigned pt to [facility] Rm 500-2.  Pt presents under IVC which Dr Mariea Clonts has upheld, and IVC documents have been faxed to [facility]  Pt's nurse, Eustaquio Maize, has been notified, and agrees to call report to [phone].  Pt is to be transported via Event organiser.   Jalene Mullet, [place] Coordinator [phone]
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
## 2018-07-13 DIAGNOSIS — F322 Major depressive disorder, single episode, severe without psychotic features: Secondary | ICD-10-CM

## 2018-07-13 MED ORDER — GABAPENTIN 300 MG PO CAPS
600.0000 mg | ORAL_CAPSULE | Freq: Three times a day (TID) | ORAL | Status: DC
  Administered 2018-07-13 – 2018-07-15 (×6): 600 mg via ORAL
  Filled 2018-07-13 (×11): qty 2

## 2018-07-13 MED ORDER — PRENATAL MULTIVITAMIN CH
1.0000 | ORAL_TABLET | Freq: Every day | ORAL | Status: DC
  Administered 2018-07-13 – 2018-07-15 (×3): 1 via ORAL
  Filled 2018-07-13 (×5): qty 1

## 2018-07-13 MED ORDER — PRIMIDONE 50 MG PO TABS
50.0000 mg | ORAL_TABLET | Freq: Two times a day (BID) | ORAL | Status: DC
  Administered 2018-07-13 – 2018-07-14 (×3): 50 mg via ORAL
  Filled 2018-07-13 (×5): qty 1

## 2018-07-13 MED ORDER — METFORMIN HCL 500 MG PO TABS
1000.0000 mg | ORAL_TABLET | Freq: Two times a day (BID) | ORAL | Status: DC
  Administered 2018-07-13 – 2018-07-15 (×4): 1000 mg via ORAL
  Filled 2018-07-13 (×8): qty 2

## 2018-07-13 MED ORDER — VORTIOXETINE HBR 10 MG PO TABS
10.0000 mg | ORAL_TABLET | Freq: Every day | ORAL | Status: DC
  Administered 2018-07-13 – 2018-07-15 (×3): 10 mg via ORAL
  Filled 2018-07-13 (×5): qty 1

## 2018-07-13 MED ORDER — PRAZOSIN HCL 2 MG PO CAPS
2.0000 mg | ORAL_CAPSULE | Freq: Every day | ORAL | Status: DC
  Administered 2018-07-13 – 2018-07-14 (×2): 2 mg via ORAL
  Filled 2018-07-13 (×3): qty 1
  Filled 2018-07-13: qty 2
  Filled 2018-07-13: qty 1
  Filled 2018-07-13: qty 2

## 2018-07-13 MED ORDER — TEMAZEPAM 30 MG PO CAPS: 30.0000 mg | ORAL_CAPSULE | Freq: Every evening | ORAL | Status: DC | PRN

## 2018-07-13 NOTE — Plan of Care (Signed)
Progress note  Pt found in bed; complaint with medication administration. Pt is minimal in her assessment. Pt states her only concern is going home. Pt does interact with peers in the milieu but minimally. Pt denies any physical complaints or pain. Pt provided support and encouragement. Pt given medication per protocol and standing orders. Q57m safety checks implemented and continued. Pt safe on the unit. Q3m safety checks implemented and continued. Will continue to monitor.   Pt progressing in the following metrics  Problem: Education: Goal: Ability to make informed decisions regarding treatment will improve Outcome: Progressing   Problem: Coping: Goal: Coping ability will improve Outcome: Progressing   Problem: Health Behavior/Discharge Planning: Goal: Identification of resources available to assist in meeting health care needs will improve Outcome: Progressing   Problem: Medication: Goal: Compliance with prescribed medication regimen will improve Outcome: Progressing
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
## 2018-07-13 NOTE — [facility] Suicide Risk Assessment (Signed)
[facility] Admission Suicide Risk Assessment   Nursing information obtained from:  Patient Demographic factors:  Caucasian, Low socioeconomic status Current Mental Status:  Suicidal ideation indicated by patient, Suicide plan, Plan includes specific time, place, or method, Self-harm thoughts, Self-harm behaviors Loss Factors:  NA Historical Factors:  Impulsivity, Prior suicide attempts, Family history of suicide Risk Reduction Factors:  Responsible for children under 28 years of age, Sense of responsibility to family, Living with another person, especially a relative  Total Time spent with patient: 45 minutes Principal Problem: Depression recurrent severe with suicidal thoughts Diagnosis:  Active Problems:   MDD (major depressive disorder), severe (HCC)  Subjective Data: Severity of depressive symptoms/PTSD symptoms/postconcussive symptoms  Continued Clinical Symptoms:  Alcohol Use Disorder Identification Test Final Score (AUDIT): 0 The "Alcohol Use Disorders Identification Test", Guidelines for Use in Primary Care, Second Edition.  World Pharmacologist [facility]). Score between 0-7:  no or low risk or alcohol related problems. Score between 8-15:  moderate risk of alcohol related problems. Score between 16-19:  high risk of alcohol related problems. Score 20 or above:  warrants further diagnostic evaluation for alcohol dependence and treatment.   CLINICAL FACTORS:   Dysthymia   Musculoskeletal: Strength & Muscle Tone: within normal limits Gait & Station: normal Patient leans: N/A  Psychiatric Specialty Exam: Physical Exam  Constitutional: She appears well-developed and well-nourished.  Cardiovascular: Normal rate.  Respiratory: Effort normal.    Review of Systems  Constitutional: Negative.   Respiratory: Negative.   Cardiovascular: Negative.   Gastrointestinal: Negative.   Genitourinary: Negative.   Skin: Negative.     Blood pressure 122/84, pulse 91, temperature 99.2 F  (37.3 C), temperature source Oral, resp. rate 18, height 5' (1.524 m), weight 99.8 kg, SpO2 97 %.Body mass index is 42.97 kg/m.  General Appearance: Casual  Eye Contact:  Good  Speech:  Normal Rate  Volume:  Normal  Mood:  Anxious and Depressed  Affect:  Congruent and Constricted  Thought Process:  Goal Directed and Descriptions of Associations: Circumstantial  Orientation:  Full (Time, Place, and Person)  Thought Content:  Logical  Suicidal Thoughts:  Yes.  without intent/plan  Homicidal Thoughts:  No  Memory:  Immediate;   Fair  Judgement:  Fair  Insight:  Good  Psychomotor Activity:  Tick left side of face with blepharospasm reportedly postconcussive  Concentration:  Concentration: Fair  Recall:  [place] of Knowledge:  Good  Language:  Good  Akathisia:  Negative  Handed:  Right  AIMS (if indicated):     Assets:  Desire for Improvement Financial Resources/Insurance Leisure Time Physical Health Resilience Social Support Talents/Skills  ADL's:  Intact  Cognition:  WNL  Sleep:  Number of Hours: 6.75      COGNITIVE FEATURES THAT CONTRIBUTE TO RISK:  None    SUICIDE RISK:   Moderate:  Frequent suicidal ideation with limited intensity, and duration, some specificity in terms of plans, no associated intent, good self-control, limited dysphoria/symptomatology, some risk factors present, and identifiable protective factors, including available and accessible social support.  PLAN OF CARE: Continue to monitor treat with antidepressants  I certify that inpatient services furnished can reasonably be expected to improve the patient's condition.   Johnn Hai, MD 07/13/2018, 1:42 PM

## 2018-07-13 NOTE — Progress Notes (Signed)
Recreation Therapy Notes  INPATIENT RECREATION THERAPY ASSESSMENT  Patient Details Name: [name] [name] MRN: [id number] DOB: 05-24-1980 Today's Date: 07/13/2018       Information Obtained From: Patient  Able to Participate in Assessment/Interview: Yes  Patient Presentation: Alert  Reason for Admission (Per Patient): Suicide Attempt  Patient Stressors: Family, Relationship, Friends, Other (Comment)(Friends)  Coping Skills:   Building control surveyor, Child psychotherapist, Arguments, Music, Talk, Art, Read  Leisure Interests (2+):  Individual - Reading, Petra Kuba - Other (Comment)(Sit outside)  Frequency of Recreation/Participation: Other (Comment)(Daily)  Awareness of Community Resources:  Yes  [employer]:  Library, [employer]  Current Use: Yes(Pt expressed she doesn't go to [employer].)  If no, Barriers?:    Expressed Interest in [place]: No  [employer] of Residence:  Investment banker, corporate  Patient Main Form of Transportation: Musician  Patient Strengths:  "I don't know"  Patient Identified Areas of Improvement:  Trusting people; Learn to love self  Patient Goal for Hospitalization:  "go home"  Current SI (including self-harm):  No  Current HI:  No  Current AVH: No  Staff Intervention Plan: Group Attendance, Collaborate with Interdisciplinary Treatment Team  Consent to Intern Participation: N/A    [name], LRT/CTRS  Ria Comment, [place] 07/13/2018, 12:10 PM

## 2018-07-13 NOTE — Progress Notes (Signed)
Recreation Therapy Notes  Date: 7.1.20 Time: 1015 Location: [address] Dayroom  Group Topic: Communication, Team Building, Problem Solving  Goal Area(s) Addresses:  Patient will effectively work with peer towards shared goal.  Patient will identify skill used to make activity successful.  Patient will identify how skills used during activity can be used to reach post d/c goals.   Behavioral Response: None  Intervention: STEM Activity   Activity:  [employer].  Patients were in groups of three.  Patients were given 12 pipe cleaners.  Patients were to build a free standing tower as tall as possible.  During the course of the activity, there will be budget cuts and certain skills will be prohibited from use.   Education: Education officer, community, Dentist.   Education Outcome: Acknowledges education/In group clarification offered/Needs additional education.   Clinical Observations/Feedback:  Pt arrived at the end of group.  Pt was able to sit in on the last part of processing.    [name], LRT/CTRS     [name] A 07/13/2018 11:31 AM

## 2018-07-13 NOTE — Tx Team (Signed)
Interdisciplinary Treatment and Diagnostic Plan Update  07/13/2018 Time of Session: 09:21am [name] [name] MRN: [id number]  Principal Diagnosis: <principal problem not specified>  Secondary Diagnoses: Active Problems:   MDD (major depressive disorder), severe (HCC)   Current Medications:  Current Facility-Administered Medications  Medication Dose Route Frequency Provider Last Rate Last Dose  . albuterol (VENTOLIN HFA) 108 (90 Base) MCG/ACT inhaler 2 puff  2 puff Inhalation Q8H PRN Rankin, Shuvon B, NP      . atorvastatin (LIPITOR) tablet 10 mg  10 mg Oral Daily Rankin, Shuvon B, NP   10 mg at 07/13/18 0736  . gabapentin (NEURONTIN) capsule 600 mg  600 mg Oral BID Rankin, Shuvon B, NP   600 mg at 07/13/18 0736  . hydrOXYzine (ATARAX/VISTARIL) tablet 25 mg  25 mg Oral TID PRN Rozetta Nunnery, NP   25 mg at 07/12/18 2117  . lisinopril (ZESTRIL) tablet 40 mg  40 mg Oral Daily Rankin, Shuvon B, NP   40 mg at 07/13/18 0736  . metFORMIN (GLUCOPHAGE) tablet 500 mg  500 mg Oral BID WC Rankin, Shuvon B, NP   500 mg at 07/13/18 0736  . nicotine (NICODERM CQ - dosed in mg/24 hours) patch 14 mg  14 mg Transdermal Daily Johnn Hai, MD   14 mg at 07/13/18 0736  . traZODone (DESYREL) tablet 50 mg  50 mg Oral QHS,MR X 1 Lindon Romp A, NP   50 mg at 07/12/18 2117   PTA Medications: Medications Prior to Admission  Medication Sig Dispense Refill Last Dose  . aspirin-acetaminophen-caffeine (EXCEDRIN MIGRAINE) 250-250-65 MG tablet Take 4 tablets by mouth every 6 (six) hours as needed for headache.     Marland Kitchen atorvastatin (LIPITOR) 10 MG tablet Take 10 mg by mouth daily.     . butalbital-acetaminophen-caffeine (FIORICET, ESGIC) 50-325-40 MG tablet Take 1-2 tablets by mouth every 6 (six) hours as needed for headache. (Patient not taking: Reported on 07/12/2018) 20 tablet 0   . gabapentin (NEURONTIN) 300 MG capsule Take 300 mg by mouth 2 (two) times a day.     Marland Kitchen glipiZIDE (GLUCOTROL XL) 10 MG 24 hr tablet Take 10  mg by mouth daily.     . hydrocortisone (ANUSOL-HC) 2.5 % rectal cream Apply rectally 2 times daily (Patient not taking: Reported on 07/12/2018) 28.35 g 0   . ibuprofen (ADVIL,MOTRIN) 200 MG tablet Take 600 mg by mouth every 6 (six) hours as needed for moderate pain.     Marland Kitchen lidocaine (XYLOCAINE) 2 % jelly 1 application by Other route as needed. (Patient not taking: Reported on 07/12/2018) 30 mL 0   . lisinopril (PRINIVIL,ZESTRIL) 40 MG tablet Take 1 tablet (40 mg total) by mouth daily. 60 tablet 0   . metFORMIN (GLUCOPHAGE) 1000 MG tablet Take 1 tablet (1,000 mg total) by mouth 2 (two) times daily with a meal. (Patient not taking: Reported on 07/12/2018) 60 tablet 0   . metFORMIN (GLUCOPHAGE-XR) 500 MG 24 hr tablet Take 500 mg by mouth daily.     . naproxen sodium (ALEVE) 220 MG tablet Take 440 mg by mouth as needed (pain).     Marland Kitchen oxyCODONE-acetaminophen (PERCOCET/ROXICET) 5-325 MG tablet Take 1 tablet by mouth every 6 (six) hours as needed. (Patient not taking: Reported on 07/12/2018) 6 tablet 0   . VENTOLIN HFA 108 (90 Base) MCG/ACT inhaler Inhale 2 puffs into the lungs every 8 (eight) hours as needed for wheezing or shortness of breath.        Patient  Stressors: Marital or family conflict Medication change or noncompliance  Patient Strengths: Average or above average intelligence Capable of independent living General fund of knowledge Physical Health  Treatment Modalities: Medication Management, Group therapy, Case management,  1 to 1 session with clinician, Psychoeducation, Recreational therapy.   Physician Treatment Plan for Primary Diagnosis: <principal problem not specified> Long Term Goal(s):     Short Term Goals:    Medication Management: Evaluate patient's response, side effects, and tolerance of medication regimen.  Therapeutic Interventions: 1 to 1 sessions, Unit Group sessions and Medication administration.  Evaluation of Outcomes: Not Met  Physician Treatment Plan for  Secondary Diagnosis: Active Problems:   MDD (major depressive disorder), severe ([place])  Long Term Goal(s):     Short Term Goals:       Medication Management: Evaluate patient's response, side effects, and tolerance of medication regimen.  Therapeutic Interventions: 1 to 1 sessions, Unit Group sessions and Medication administration.  Evaluation of Outcomes: Not Met   RN Treatment Plan for Primary Diagnosis: <principal problem not specified> Long Term Goal(s): Knowledge of disease and therapeutic regimen to maintain health will improve  Short Term Goals: Ability to participate in decision making will improve, Ability to verbalize feelings will improve, Ability to disclose and discuss suicidal ideas, Ability to identify and develop effective coping behaviors will improve and Compliance with prescribed medications will improve  Medication Management: RN will administer medications as ordered by provider, will assess and evaluate patient's response and provide education to patient for prescribed medication. RN will report any adverse and/or side effects to prescribing provider.  Therapeutic Interventions: 1 on 1 counseling sessions, Psychoeducation, Medication administration, Evaluate responses to treatment, Monitor vital signs and CBGs as ordered, Perform/monitor CIWA, COWS, AIMS and Fall Risk screenings as ordered, Perform wound care treatments as ordered.  Evaluation of Outcomes: Not Met   LCSW Treatment Plan for Primary Diagnosis: <principal problem not specified> Long Term Goal(s): Safe transition to appropriate next level of care at discharge, Engage patient in therapeutic group addressing interpersonal concerns.  Short Term Goals: Engage patient in aftercare planning with referrals and resources and Increase skills for wellness and recovery  Therapeutic Interventions: Assess for all discharge needs, 1 to 1 time with Social worker, Explore available resources and support systems,  Assess for adequacy in community support network, Educate family and significant other(s) on suicide prevention, Complete Psychosocial Assessment, Interpersonal group therapy.  Evaluation of Outcomes: Not Met   Progress in Treatment: Attending groups: No. Participating in groups: No. Taking medication as prescribed: Yes. Toleration medication: Yes. Family/Significant other contact made: No, will contact:  will contact if given consent to contact Patient understands diagnosis: Yes. Discussing patient identified problems/goals with staff: Yes. Medical problems stabilized or resolved: Yes. Denies suicidal/homicidal ideation: Yes. Issues/concerns per patient self-inventory: No. Other:   New problem(s) identified: No, Describe:  None  New Short Term/Long Term Goal(s): Medication stabilization, elimination of SI thoughts, and development of a comprehensive mental wellness plan.   Patient Goals:  "Going home"  Discharge Plan or Barriers: CSW will continue to follow up for appropriate referrals and possible discharge planning.  Reason for Continuation of Hospitalization: Anxiety Depression Medication stabilization  Estimated Length of Stay: 1-2 days  Attendees: Patient: 07/13/2018   Physician: Dr. Johnn Hai, MD 07/13/2018  Nursing: Legrand Como, RN 07/13/2018  RN Care Manager: 07/13/2018   Social Worker: Ardelle Anton, LCSW 07/13/2018   Recreational Therapist:  07/13/2018  Other:  07/13/2018  Other:  07/13/2018   Other: 07/13/2018  Scribe for Treatment Team: Trecia Rogers, [place] 07/13/2018 10:01 AM

## 2018-07-14 MED ORDER — METOPROLOL SUCCINATE ER 50 MG PO TB24
50.0000 mg | ORAL_TABLET | Freq: Every day | ORAL | Status: DC
  Administered 2018-07-14 – 2018-07-15 (×2): 50 mg via ORAL
  Filled 2018-07-14 (×4): qty 1

## 2018-07-14 MED ORDER — PRIMIDONE 50 MG PO TABS
50.0000 mg | ORAL_TABLET | Freq: Three times a day (TID) | ORAL | Status: DC
  Administered 2018-07-14 – 2018-07-15 (×4): 50 mg via ORAL
  Filled 2018-07-14 (×9): qty 1

## 2018-07-14 NOTE — Progress Notes (Signed)
D: Pt denies SI/HI/AVH. Pt is pleasant and cooperative. Pt stated she was better due to not wanting to kill herself A: Pt was offered support and encouragement. Pt was given scheduled medications. Pt was encourage to attend groups. Q 15 minute checks were done for safety.  R:Pt attends groups and interacts well with peers and staff. Pt is taking medication. Pt has no complaints.Pt receptive to treatment and safety maintained on unit.

## 2018-07-14 NOTE — Progress Notes (Signed)
DAR NOTE: Patient presents with anxious affect and depressed mood.  Denies suicidal thoughts, pain, auditory and visual hallucinations.  Described energy level as normal and concentration as good.  Rates depression at 3, hopelessness at 3, and anxiety at 4.  Maintained on routine safety checks.  Medications given as prescribed.  Support and encouragement offered as needed.  Attended group and participated.  States goal for today is "working on getting home/feeling whole and not broken."  Patient observed socializing with peers in the dayroom.  Patient is safe on and off the unit.  Offered no complaint.

## 2018-07-14 NOTE — Progress Notes (Signed)
D: Pt denies SI/HI/AVH. Pt is pleasant and cooperative. Pt stated she was doing ok, plans to do IOP on D/C A: Pt was offered support and encouragement. Pt was given scheduled medications. Pt was encourage to attend groups. Q 15 minute checks were done for safety.  R:Pt attends groups and interacts well with peers and staff. Pt is taking medication. Pt has no complaints.Pt receptive to treatment and safety maintained on unit.  Problem: Medication: Goal: Compliance with prescribed medication regimen will improve Outcome: Progressing   Problem: Self-Concept: Goal: Will verbalize positive feelings about self Outcome: Progressing

## 2018-07-14 NOTE — Progress Notes (Signed)
[facility]  07/14/2018 10:46 AM Conrad BurlingtonSarah K Feeley  MRN:  [id number] Subjective:   Patient has made some progress she has benefited from cognitive therapy she is alert and oriented without thoughts of harming self today can contract here she is pleased with the reduction in her involuntary movement that was basically induced by trauma/either postconcussive or direct injury to her cranial nerve.  Overall improved though and not tearful today.  No nightmares reported. 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 Anxiety   . Back fracture   . Depression   . Diabetes mellitus without complication (HCC)   . Family history of adverse reaction to anesthesia    sister gets nausea and vomitting and itching on her face. Around her nose and mouth.  . Fatty liver disease, nonalcoholic   . GERD (gastroesophageal reflux disease)   . H/O cesarean section complicating pregnancy 04/18/2014  . Hypertension   . Neuromuscular disorder (HCC)    eye twitch on left eye   . Obesity   . Pelvis fracture (HCC)   . S/P cesarean section 06/01/2014  . S/P tubal ligation 06/01/2014    Past Surgical History:  Procedure Laterality Date  . CESAREAN SECTION    . CESAREAN SECTION WITH BILATERAL TUBAL LIGATION Bilateral 06/01/2014   Procedure: CESAREAN SECTION WITH BILATERAL TUBAL LIGATION;  Surgeon: Sherian ReinJody Bovard-Stuckert, MD;  Location: [facility];  Service: Obstetrics;  Laterality: Bilateral;  MD requests RNFA  . CHEST TUBE INSERTION    . TONSILLECTOMY    . TRACHEOSTOMY     Family History:  Family History  Problem Relation Age of Onset  . Diabetes Maternal Grandmother   . Diabetes Paternal Grandmother    Family Psychiatric  History: neg Social History:  Social History   Substance and Sexual  Activity  Alcohol Use No     Social History   Substance and Sexual Activity  Drug Use No    Social History   Socioeconomic History  . Marital status: Legally Separated    Spouse name: Not on file  . Number of children: Not on file  . Years of education: Not on file  . Highest education level: Not on file  Occupational History  . Not on file  Social Needs  . Financial resource strain: Not on file  . Food insecurity    Worry: Not on file    Inability: Not on file  . Transportation needs    Medical: Not on file    Non-medical: Not on file  Tobacco Use  . Smoking status: Current Every Day Smoker    Packs/day: 2.00    Years: 12.00    Pack years: 24.00    Types: Cigarettes  . Smokeless tobacco: Never Used  Substance and Sexual Activity  . Alcohol use: No  . Drug use: No  . Sexual activity: Yes    Birth control/protection: None  Lifestyle  . Physical activity    Days per week: Not on file    Minutes per session: Not on file  . Stress: Not on file  Relationships  . Social Musicianconnections    Talks on phone: Not on file    Gets together: Not on file    Attends religious service: Not on file    Active member of club or  organization: Not on file    Attends meetings of clubs or organizations: Not on file    Relationship status: Not on file  Other Topics Concern  . Not on file  Social History Narrative  . Not on file   Additional Social History:                         Sleep: Good  Appetite:  Good  Current Medications: Current Facility-Administered Medications  Medication Dose Route Frequency Provider Last Rate Last Dose  . albuterol (VENTOLIN HFA) 108 (90 Base) MCG/ACT inhaler 2 puff  2 puff Inhalation Q8H PRN Rankin, Shuvon B, NP      . atorvastatin (LIPITOR) tablet 10 mg  10 mg Oral Daily Rankin, Shuvon B, NP   10 mg at 07/14/18 0805  . gabapentin (NEURONTIN) capsule 600 mg  600 mg Oral TID Malvin JohnsFarah, Yasmene Salomone, MD   600 mg at 07/14/18 0805  . hydrOXYzine  (ATARAX/VISTARIL) tablet 25 mg  25 mg Oral TID PRN Nira ConnBerry, Jason A, NP   25 mg at 07/13/18 2142  . lisinopril (ZESTRIL) tablet 40 mg  40 mg Oral Daily Rankin, Shuvon B, NP   40 mg at 07/14/18 0805  . metFORMIN (GLUCOPHAGE) tablet 1,000 mg  1,000 mg Oral BID WC Malvin JohnsFarah, Vivica Dobosz, MD   1,000 mg at 07/14/18 0805  . metoprolol succinate (TOPROL-XL) 24 hr tablet 50 mg  50 mg Oral Daily Malvin JohnsFarah, Tiaja Hagan, MD      . nicotine (NICODERM CQ - dosed in mg/24 hours) patch 14 mg  14 mg Transdermal Daily Malvin JohnsFarah, Sherl Yzaguirre, MD   14 mg at 07/14/18 [phone]  . prazosin (MINIPRESS) capsule 2 mg  2 mg Oral QHS Malvin JohnsFarah, Jamis Kryder, MD   2 mg at 07/13/18 2142  . prenatal multivitamin tablet 1 tablet  1 tablet Oral Daily Malvin JohnsFarah, Antoinette Haskett, MD   1 tablet at 07/14/18 [phone]  . primidone (MYSOLINE) tablet 50 mg  50 mg Oral TID Malvin JohnsFarah, Hannia Matchett, MD      . temazepam (RESTORIL) capsule 30 mg  30 mg Oral QHS PRN Malvin JohnsFarah, Willie Loy, MD      . traZODone (DESYREL) tablet 50 mg  50 mg Oral QHS,MR X 1 Nira ConnBerry, Jason A, NP   50 mg at 07/13/18 2142  . vortioxetine HBr (TRINTELLIX) tablet 10 mg  10 mg Oral Daily Malvin JohnsFarah, Stepanie Graver, MD   10 mg at 07/14/18 [phone]    Lab Results:  Results for orders placed or performed during the hospital encounter of 07/11/18 (from the past 48 hour(s))  POC urine preg, ED (not at [facility])     Status: None   Collection Time: 07/12/18 11:00 AM  Result Value Ref Range   Preg Test, Ur NEGATIVE NEGATIVE    Comment:        THE SENSITIVITY OF THIS METHODOLOGY IS >24 mIU/mL   Urinalysis, Routine w reflex microscopic     Status: Abnormal   Collection Time: 07/12/18 11:01 AM  Result Value Ref Range   Color, Urine YELLOW YELLOW   APPearance CLEAR CLEAR   Specific Gravity, Urine 1.015 1.005 - 1.030   pH 5.0 5.0 - 8.0   Glucose, UA >=500 (A) NEGATIVE mg/dL   Hgb urine dipstick NEGATIVE NEGATIVE   Bilirubin Urine NEGATIVE NEGATIVE   Ketones, ur NEGATIVE NEGATIVE mg/dL   Protein, ur NEGATIVE NEGATIVE mg/dL   Nitrite NEGATIVE NEGATIVE   Leukocytes,Ua  NEGATIVE NEGATIVE   RBC / HPF 0-5 0 - 5 RBC/hpf  WBC, UA 6-10 0 - 5 WBC/hpf   Bacteria, UA RARE (A) NONE SEEN   Squamous Epithelial / LPF 0-5 0 - 5   Mucus PRESENT     Comment: Performed at [facility], [address] [place], [place] [address]  Lipid panel     Status: Abnormal   Collection Time: 07/12/18 11:15 AM  Result Value Ref Range   Cholesterol 172 0 - 200 mg/dL   Triglycerides 191251 (H) <150 mg/dL   HDL 31 (L) >47>40 mg/dL   Total CHOL/HDL Ratio 5.5 RATIO   VLDL 50 (H) 0 - 40 mg/dL   LDL Cholesterol 91 0 - 99 mg/dL    Comment:        Total Cholesterol/HDL:CHD Risk Coronary Heart Disease Risk Table                     Men   Women  1/2 Average Risk   3.4   3.3  Average Risk       5.0   4.4  2 X Average Risk   9.6   7.1  3 X Average Risk  23.4   11.0        Use the calculated Patient Ratio above and the CHD Risk Table to determine the patient's CHD Risk.        ATP III CLASSIFICATION (LDL):  <100     mg/dL   Optimal  829-562100-129  mg/dL   Near or Above                    Optimal  130-159  mg/dL   Borderline  130-865160-189  mg/dL   High  >784>190     mg/dL   Very High Performed at [facility], [address] [place], [place] [address]   TSH     Status: None   Collection Time: 07/12/18 11:15 AM  Result Value Ref Range   TSH 0.759 0.350 - 4.500 uIU/mL    Comment: Performed by a 3rd Generation assay with a functional sensitivity of <=0.01 uIU/mL. Performed at [facility], [address] [place], [place] [address]   CBG monitoring, ED     Status: Abnormal   Collection Time: 07/12/18  4:34 PM  Result Value Ref Range   Glucose-Capillary 126 (H) 70 - 99 mg/dL   Comment 1 Notify RN     Blood Alcohol level:  Lab Results  Component Value Date   ETH <10 07/11/2018   ETH <10 09/30/2017    Metabolic Disorder Labs: Lab Results  Component Value Date   HGBA1C 8.1 (H) 07/11/2018   MPG 185.77 07/11/2018   No results found  for: PROLACTIN Lab Results  Component Value Date   CHOL 172 07/12/2018   TRIG 251 (H) 07/12/2018   HDL 31 (L) 07/12/2018   CHOLHDL 5.5 07/12/2018   VLDL 50 (H) 07/12/2018   LDLCALC 91 07/12/2018    Physical Findings: AIMS: Facial and Oral Movements Muscles of Facial Expression: None, normal Lips and Perioral Area: None, normal Jaw: None, normal Tongue: None, normal,Extremity Movements Upper (arms, wrists, hands, fingers): None, normal Lower (legs, knees, ankles, toes): None, normal, Trunk Movements Neck, shoulders, hips: None, normal, Overall Severity Severity of abnormal movements (highest score from questions above): None, normal Incapacitation due to abnormal movements: None, normal Patient's awareness of abnormal movements (rate only patient's report): No Awareness, Dental Status Current problems with teeth and/or dentures?: No Does patient usually wear  dentures?: No  CIWA:    COWS:     Musculoskeletal: Strength & Muscle Tone: within normal limits Gait & Station: normal Patient leans: N/A  Psychiatric Specialty Exam: Physical Exam  ROS  Blood pressure (!) 142/84, pulse 86, temperature 98.1 F (36.7 C), resp. rate 16, height 5' (1.524 m), weight 99.8 kg, SpO2 97 %.Body mass index is 42.97 kg/m.  General Appearance: Casual  Eye Contact:  Good  Speech:  Clear and Coherent  Volume:  Decreased  Mood:  Anxious and Dysphoric  Affect:  Appropriate and Congruent  Thought Process:  Goal Directed and Descriptions of Associations: Tangential  Orientation:  Full (Time, Place, and Person)  Thought Content:  Logical  Suicidal Thoughts:  Yes.  without intent/plan passive  Homicidal Thoughts:  No  Memory:  Immediate;   Good  Judgement:  Good  Insight:  Good  Psychomotor Activity:  Normal  Concentration:  Concentration: Good  Recall:  Good  Fund of Knowledge:  Good  Language:  Good  Akathisia:  Negative  Handed:  Right  AIMS (if indicated):     Assets:  Physical  Health Resilience Social Support  ADL's:  Intact  Cognition:  WNL  Sleep:  Number of Hours: 6.75     Treatment Plan Summary: Daily contact with patient to assess and evaluate symptoms and progress in treatment and Medication management plans are to escalate primidone continue cognitive therapy monitor for safety continue antidepressant therapy may go in 24 to 48 hours if continues to improve and can contract fully  Johnn Hai, MD 07/14/2018, 10:46 AM

## 2018-07-14 NOTE — BHH Group Notes (Signed)
Occupational Therapy Group Note  Date:  07/14/2018 Time:  2:18 PM  Group Topic/Focus:  Leisure Group  Participation Level:  Active  Participation Quality:  Appropriate  Affect:  Flat  Cognitive:  Alert  Insight: Improving  Engagement in Group:  Engaged  Modes of Intervention:  Activity, Discussion, Education and Socialization  Additional Comments:    S: "My goals are centered around d/c and improving myself for this to be successful"  O: OT group focus on leisure this date, while incorporating coping skills to ensure understanding. Pts to play game of Uno: and name a struggle they're facing, a communication skill, something they like about yourself, stress management, and a healthy way to manage anger per certain cards played. Pt also assessed for attention, ability to follow rules, and temperament with rules.   A: Pt presents with flat affect, at times tearful in session. She shares how her goals are centered around bettering herself and discharge. She is overall very appropriate for game and session. Pt did appear triggered when other pts were mentioning past trauma, but overall did not engage in conversation.  P: OT group will be x1 per week while pt inpatient.  Zenovia Jarred, MSOT, OTR/L Behavioral Health OT/ Acute Relief OT PHP Office: [phone]  Zenovia Jarred 07/14/2018, 2:18 PM

## 2018-07-15 MED ORDER — PRENATAL MULTIVITAMIN CH: 1.0000 | ORAL_TABLET | Freq: Every day | ORAL | 11 refills | Status: DC

## 2018-07-15 MED ORDER — PRAZOSIN HCL 2 MG PO CAPS: 2.0000 mg | ORAL_CAPSULE | Freq: Every day | ORAL | 2 refills | Status: DC

## 2018-07-15 MED ORDER — PRIMIDONE 50 MG PO TABS: 50.0000 mg | ORAL_TABLET | Freq: Three times a day (TID) | ORAL | 2 refills | Status: DC

## 2018-07-15 MED ORDER — VORTIOXETINE HBR 10 MG PO TABS: 10.0000 mg | ORAL_TABLET | Freq: Every day | ORAL | 2 refills | Status: DC

## 2018-07-15 MED ORDER — GABAPENTIN 300 MG PO CAPS: 600.0000 mg | ORAL_CAPSULE | Freq: Three times a day (TID) | ORAL | 3 refills | Status: DC

## 2018-07-15 NOTE — Progress Notes (Signed)
[place] NOVEL CORONAVIRUS (COVID-19) DAILY CHECK-OFF SYMPTOMS - answer yes or no to each - every day NO YES  Have you had a fever in the past 24 hours?  . Fever (Temp > 37.80C / 100F) X   Have you had any of these symptoms in the past 24 hours? . New Cough .  Sore Throat  .  Shortness of Breath .  Difficulty Breathing .  Unexplained Body Aches   X   Have you had any one of these symptoms in the past 24 hours not related to allergies?   . Runny Nose .  Nasal Congestion .  Sneezing   X   If you have had runny nose, nasal congestion, sneezing in the past 24 hours, has it worsened?  X   EXPOSURES - check yes or no X   Have you traveled outside the state in the past 14 days?  X   Have you been in contact with someone with a confirmed diagnosis of COVID-19 or PUI in the past 14 days without wearing appropriate PPE?  X   Have you been living in the same home as a person with confirmed diagnosis of COVID-19 or a PUI (household contact)?    X   Have you been diagnosed with COVID-19?    X              What to do next: Answered NO to all: Answered YES to anything:   Proceed with unit schedule Follow the [facility] Inpatient Flowsheet.   

## 2018-07-15 NOTE — Plan of Care (Signed)
  Problem: Education: Goal: Ability to make informed decisions regarding treatment will improve Outcome: Adequate for Discharge   Problem: Coping: Goal: Coping ability will improve Outcome: Adequate for Discharge   Problem: Health Behavior/Discharge Planning: Goal: Identification of resources available to assist in meeting health care needs will improve Outcome: Adequate for Discharge   Problem: Medication: Goal: Compliance with prescribed medication regimen will improve Outcome: Adequate for Discharge   Problem: Self-Concept: Goal: Ability to disclose and discuss suicidal ideas will improve Outcome: Adequate for Discharge Goal: Will verbalize positive feelings about self Outcome: Adequate for Discharge   Problem: Education: Goal: Knowledge of [facility] Education information/materials will improve Outcome: Adequate for Discharge Goal: Emotional status will improve Outcome: Adequate for Discharge Goal: Mental status will improve Outcome: Adequate for Discharge Goal: Verbalization of understanding the information provided will improve Outcome: Adequate for Discharge   Problem: Activity: Goal: Interest or engagement in activities will improve Outcome: Adequate for Discharge Goal: Sleeping patterns will improve Outcome: Adequate for Discharge   Problem: Coping: Goal: Ability to verbalize frustrations and anger appropriately will improve Outcome: Adequate for Discharge Goal: Ability to demonstrate self-control will improve Outcome: Adequate for Discharge   Problem: Health Behavior/Discharge Planning: Goal: Identification of resources available to assist in meeting health care needs will improve Outcome: Adequate for Discharge Goal: Compliance with treatment plan for underlying cause of condition will improve Outcome: Adequate for Discharge   Problem: Physical Regulation: Goal: Ability to maintain clinical measurements within normal limits will improve Outcome:  Adequate for Discharge   Problem: Safety: Goal: Periods of time without injury will increase Outcome: Adequate for Discharge   

## 2018-07-15 NOTE — [facility] Suicide Risk Assessment (Signed)
[place] INPATIENT:  Family/Significant Other Suicide Prevention Education  Suicide Prevention Education:  Education Completed; with mother, Loman [place] [phone]) has been identified by the patient as the family member/significant other with whom the patient will be residing, and identified as the person(s) who will aid the patient in the event of a mental health crisis (suicidal ideations/suicide attempt).  With written consent from the patient, the family member/significant other has been provided the following suicide prevention education, prior to the and/or following the discharge of the patient.  The suicide prevention education provided includes the following:  Suicide risk factors  Suicide prevention and interventions  National Suicide Hotline telephone number  [facility] assessment telephone number  [facility] Emergency Assistance [place] and/or Residential Mobile Crisis Unit telephone number  Request made of family/significant other to:  Remove weapons (e.g., guns, rifles, knives), all items previously/currently identified as safety concern.    Remove drugs/medications (over-the-counter, prescriptions, illicit drugs), all items previously/currently identified as a safety concern.  The family member/significant other verbalizes understanding of the suicide prevention education information provided.  The family member/significant other agrees to remove the items of safety concern listed above.  Bethena Roys reports she does not have any questions or concerns regarding the patient's discharge. She states that she feels very positive about the patient's discharge and hopeful that she will has improved.   CSW will continue to follow for a safe discharge.   Marylee Floras 07/15/2018, 9:31 AM

## 2018-07-15 NOTE — Progress Notes (Signed)
  [facility] Adult Case Management Discharge Plan :  Will you be returning to the same living situation after discharge:  Yes,  patient reports she is returning home with her parents At discharge, do you have transportation home?: Yes,  patient reports her mother is picking her up Do you have the ability to pay for your medications: Yes,  BCBS  Release of information consent forms completed and in the chart;  Patient's signature needed at discharge.  Patient to Follow up at: Follow-up Information    BEHAVIORAL HEALTH PARTIAL HOSPITALIZATION PROGRAM Follow up on 07/20/2018.   Specialty: Behavioral Health Why: Your CCA appointment is Wednesday, 7/8 at 12:00p. Appointments will be virtual and an email will be sent to you with a WebEx link.  Contact information: [place] [phone] [place] [phone]          Next level of care provider has access to [place] and Suicide Prevention discussed: Yes,  with the patient's mother  Have you used any form of tobacco in the last 30 days? (Cigarettes, Smokeless Tobacco, Cigars, and/or Pipes): Yes  Has patient been referred to the Quitline?: Patient refused referral  Patient has been referred for addiction treatment: N/A  Marylee Floras, [place] 07/15/2018, 10:20 AM

## 2018-07-15 NOTE — Progress Notes (Signed)
Patient ID: [name] [name], female   DOB: 1980/08/06, 37 y.o.   MRN: [id number]  D: Pt alert and oriented on the unit.   A: Education, support, and encouragement provided. Discharge summary, medications and follow up appointments reviewed with pt. Suicide prevention resources provided, including "My 3 App." Pt's belongings in locker # 17 returned and belongings sheet signed.  R: Pt denies SI/HI, A/VH, pain, or any concerns at this time. Pt ambulatory on and off unit. Pt discharged to lobby.
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
## 2018-07-15 NOTE — BHH Suicide Risk Assessment (Signed)
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 rate 16, height 5' (1.524 m), weight 99.8 kg, SpO2 97 %.Body mass index is 42.97 kg/m.  General Appearance: Casual  Eye Contact::  Good  Speech:  Clear and Coherent409  Volume:  Normal  Mood:  Euthymic  Affect:  Congruent  Thought Process:  Coherent and Descriptions of Associations: Intact  Orientation:  Full (Time, Place, and Person)  Thought Content:  Tangential  Suicidal Thoughts:  No  Homicidal Thoughts:  No  Memory:  NA Immediate;   Good  Judgement:  Intact  Insight:  Fair  Psychomotor Activity:  Normal tic/blepharospasms of the left side diminished with meds on board  Concentration:  Good  Recall:  Good  Fund of Knowledge:Good  Language: Good  Akathisia:  Negative  Handed:  Right  AIMS (if indicated):     Assets:  Communication Skills Desire for Improvement Housing Leisure Time Physical Health Resilience Social Support  Sleep:  Number of Hours: 6.5  Cognition: WNL  ADL's:  Intact   Mental Status Per Nursing Assessment::   On Admission:  Suicidal ideation indicated by patient, Suicide plan, Plan includes specific time, place, or method, Self-harm thoughts, Self-harm behaviors  Demographic Factors:  Caucasian  Loss Factors: NA  Historical Factors: NA  Risk Reduction Factors:   Sense of responsibility to family and Religious beliefs about death  Continued Clinical Symptoms:  Depression:   Severe  Cognitive  Features That Contribute To Risk:  None    Suicide Risk:  Minimal: No identifiable suicidal ideation.  Patients presenting with no risk factors but with morbid ruminations; may be classified as minimal risk based on the severity of the depressive symptoms  Follow-up [facility] Follow up on 07/20/2018.   Specialty: Behavioral Health Why: Your CCA appointment is Wednesday, 7/8 at 12:00p. Appointments will be virtual and an email will be sent to you with a WebEx link.  Contact information: [place] [phone] [place] [phone]          Plan Of Care/Follow-up recommendations:  Activity:  full  [name] Sovine, MD 07/15/2018, 8:19 AM

## 2018-07-20 ENCOUNTER — Other Ambulatory Visit (HOSPITAL_COMMUNITY): Payer: BLUE CROSS/BLUE SHIELD | Attending: Family | Admitting: Professional

## 2018-07-20 ENCOUNTER — Other Ambulatory Visit: Payer: Self-pay

## 2018-07-20 DIAGNOSIS — F322 Major depressive disorder, single episode, severe without psychotic features: Secondary | ICD-10-CM

## 2018-07-21 ENCOUNTER — Telehealth (HOSPITAL_COMMUNITY): Payer: Self-pay | Admitting: Professional

## 2018-07-22 ENCOUNTER — Telehealth (HOSPITAL_COMMUNITY): Payer: Self-pay | Admitting: Professional

## 2018-07-22 NOTE — Telephone Encounter (Signed)
Cln called pt to report she needs to call the customer service number on the back of her ins card to find providers in network. Cln was unable to get informaiton from ins company about providers in network for EMDR or [place]. Pt agrees to call today to get providers and then followup with providers to get on schedule. Pt denies SI/HI/AVH at this time.
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
## 2018-08-04 DIAGNOSIS — F4310 Post-traumatic stress disorder, unspecified: Secondary | ICD-10-CM | POA: Insufficient documentation

## 2018-08-04 DIAGNOSIS — F331 Major depressive disorder, recurrent, moderate: Secondary | ICD-10-CM | POA: Insufficient documentation

## 2018-10-18 ENCOUNTER — Emergency Department (HOSPITAL_COMMUNITY): Payer: BLUE CROSS/BLUE SHIELD

## 2018-10-18 ENCOUNTER — Emergency Department (HOSPITAL_COMMUNITY)
Admission: EM | Admit: 2018-10-18 | Discharge: 2018-10-18 | Disposition: A | Discharge status: Home / Self Care | Payer: BLUE CROSS/BLUE SHIELD | Attending: Emergency Medicine | Admitting: Emergency Medicine

## 2018-10-18 ENCOUNTER — Other Ambulatory Visit: Payer: Self-pay

## 2018-10-18 DIAGNOSIS — K047 Periapical abscess without sinus: Secondary | ICD-10-CM | POA: Insufficient documentation

## 2018-10-18 DIAGNOSIS — R110 Nausea: Secondary | ICD-10-CM | POA: Diagnosis not present

## 2018-10-18 DIAGNOSIS — Z79899 Other long term (current) drug therapy: Secondary | ICD-10-CM | POA: Diagnosis not present

## 2018-10-18 DIAGNOSIS — F17210 Nicotine dependence, cigarettes, uncomplicated: Secondary | ICD-10-CM | POA: Insufficient documentation

## 2018-10-18 DIAGNOSIS — I10 Essential (primary) hypertension: Secondary | ICD-10-CM | POA: Diagnosis not present

## 2018-10-18 DIAGNOSIS — Z7984 Long term (current) use of oral hypoglycemic drugs: Secondary | ICD-10-CM | POA: Diagnosis not present

## 2018-10-18 DIAGNOSIS — E119 Type 2 diabetes mellitus without complications: Secondary | ICD-10-CM | POA: Diagnosis not present

## 2018-10-18 DIAGNOSIS — K0889 Other specified disorders of teeth and supporting structures: Secondary | ICD-10-CM | POA: Diagnosis present

## 2018-10-18 LAB — I-STAT CHEM 8, ED
BUN: 12 mg/dL (ref 6–20)
Calcium, Ion: 1.19 mmol/L (ref 1.15–1.40)
Chloride: 105 mmol/L (ref 98–111)
Creatinine, Ser: 0.4 mg/dL — ABNORMAL LOW (ref 0.44–1.00)
Glucose, Bld: 183 mg/dL — ABNORMAL HIGH (ref 70–99)
HCT: 37 % (ref 36.0–46.0)
Hemoglobin: 12.6 g/dL (ref 12.0–15.0)
Potassium: 4 mmol/L (ref 3.5–5.1)
Sodium: 140 mmol/L (ref 135–145)
TCO2: 23 mmol/L (ref 22–32)

## 2018-10-18 MED ORDER — FENTANYL CITRATE (PF) 100 MCG/2ML IJ SOLN
100.0000 ug | Freq: Once | INTRAMUSCULAR | Status: AC
  Administered 2018-10-18: 100 ug via INTRAVENOUS
  Filled 2018-10-18: qty 2

## 2018-10-18 MED ORDER — BUPIVACAINE-EPINEPHRINE (PF) 0.5% -1:200000 IJ SOLN
1.8000 mL | Freq: Once | INTRAMUSCULAR | Status: AC
  Administered 2018-10-18: 17:00:00 1.8 mL
  Filled 2018-10-18: qty 1.8

## 2018-10-18 MED ORDER — KETOROLAC TROMETHAMINE 15 MG/ML IJ SOLN
15.0000 mg | Freq: Once | INTRAMUSCULAR | Status: AC
  Administered 2018-10-18: 15 mg via INTRAVENOUS
  Filled 2018-10-18: qty 1

## 2018-10-18 MED ORDER — FENTANYL CITRATE (PF) 100 MCG/2ML IJ SOLN
100.0000 ug | Freq: Once | INTRAMUSCULAR | Status: AC
  Administered 2018-10-18: 18:00:00 100 ug via INTRAVENOUS
  Filled 2018-10-18: qty 2

## 2018-10-18 MED ORDER — CLINDAMYCIN HCL 150 MG PO CAPS: 450.0000 mg | ORAL_CAPSULE | Freq: Three times a day (TID) | ORAL | 0 refills | Status: AC

## 2018-10-18 MED ORDER — CLINDAMYCIN PHOSPHATE 900 MG/50ML IV SOLN
900.0000 mg | Freq: Once | INTRAVENOUS | Status: AC
  Administered 2018-10-18: 900 mg via INTRAVENOUS
  Filled 2018-10-18: qty 50

## 2018-10-18 MED ORDER — FENTANYL CITRATE (PF) 100 MCG/2ML IJ SOLN
100.0000 ug | Freq: Once | INTRAMUSCULAR | Status: AC
  Administered 2018-10-18: 17:00:00 100 ug via INTRAMUSCULAR
  Filled 2018-10-18: qty 2

## 2018-10-18 MED ORDER — OXYCODONE HCL 5 MG PO TABS
5.0000 mg | ORAL_TABLET | Freq: Once | ORAL | Status: AC
  Administered 2018-10-18: 5 mg via ORAL
  Filled 2018-10-18: qty 1

## 2018-10-18 MED ORDER — IOHEXOL 300 MG/ML  SOLN
75.0000 mL | Freq: Once | INTRAMUSCULAR | Status: AC | PRN
  Administered 2018-10-18: 75 mL via INTRAVENOUS

## 2018-10-18 MED ORDER — MORPHINE SULFATE 15 MG PO TABS: 15.0000 mg | ORAL_TABLET | ORAL | 0 refills | Status: DC | PRN

## 2018-10-18 MED ORDER — ACETAMINOPHEN 500 MG PO TABS
1000.0000 mg | ORAL_TABLET | Freq: Once | ORAL | Status: AC
  Administered 2018-10-18: 1000 mg via ORAL
  Filled 2018-10-18: qty 2

## 2018-10-18 NOTE — ED Notes (Signed)
Discharge instructions discussed with pt. Pt verbalized understanding. Pt stable and ambulatory. No signature pad available. 

## 2018-10-18 NOTE — ED Provider Notes (Signed)
[facility] EMERGENCY DEPARTMENT Provider Note   CSN: [id number] Arrival date & time: 10/18/18  1344     History   Chief Complaint Chief Complaint  Patient presents with  . Dental Pain    HPI [name] [name] is a 38 y.o. female.     38 yo F with a chief complaints of dental pain.  Going on for the past few days.  Saw her PCP yesterday and was started on antibiotics.  Worsening of her edema and so came to the doctor's office again today.  Was then sent to the ED for further evaluation.  Patient denies fevers but has had some nausea.  Has had dental pain off and on continues to smoke.  Has not seen a dentist in some time.  The history is provided by the patient.  Dental Pain Location:  Upper Upper teeth location:  5/RU 1st bicuspid Quality:  Aching, burning and pressure-like Severity:  Moderate Onset quality:  Gradual Duration:  2 days Timing:  Constant Progression:  Worsening Chronicity:  New Previous work-up:  Dental exam Relieved by:  Nothing Worsened by:  Nothing Ineffective treatments:  None tried Associated symptoms: no congestion, no fever and no headaches     Past Medical History:  Diagnosis Date  . Anxiety   . Back fracture   . Depression   . Diabetes mellitus without complication (HCC)   . Family history of adverse reaction to anesthesia    sister gets nausea and vomitting and itching on her face. Around her nose and mouth.  . Fatty liver disease, nonalcoholic   . GERD (gastroesophageal reflux disease)   . H/O cesarean section complicating pregnancy 04/18/2014  . Hypertension   . Neuromuscular disorder (HCC)    eye twitch on left eye   . Obesity   . Pelvis fracture (HCC)   . S/P cesarean section 06/01/2014  . S/P tubal ligation 06/01/2014    Patient Active Problem List   Diagnosis Date Noted  . MDD (major depressive disorder), severe (HCC) 07/12/2018  . S/P cesarean section 06/01/2014  . S/P tubal ligation 06/01/2014  . H/O  cesarean section complicating pregnancy 04/18/2014  . Anxiety 03/16/2013  . Smoking 03/16/2013  . Acute chest pain 03/01/2013    Past Surgical History:  Procedure Laterality Date  . CESAREAN SECTION    . CESAREAN SECTION WITH BILATERAL TUBAL LIGATION Bilateral 06/01/2014   Procedure: CESAREAN SECTION WITH BILATERAL TUBAL LIGATION;  Surgeon: Sherian Rein, MD;  Location: [facility];  Service: Obstetrics;  Laterality: Bilateral;  MD requests RNFA  . CHEST TUBE INSERTION    . TONSILLECTOMY    . TRACHEOSTOMY       OB History    Gravida  2   Para  2   Term  2   Preterm      AB      Living  2     SAB      TAB      Ectopic      Multiple  0   Live Births  2            Home Medications    Prior to Admission medications   Medication Sig Start Date End Date Taking? Authorizing Provider  aspirin-acetaminophen-caffeine (EXCEDRIN MIGRAINE) [phone] MG tablet Take 4 tablets by mouth every 6 (six) hours as needed for headache.    [provider]  atorvastatin (LIPITOR) 10 MG tablet Take 10 mg by mouth daily. 03/10/18   [provider]  clindamycin (CLEOCIN) 150 MG capsule Take 3 capsules (450 mg total) by mouth 3 (three) times daily for 7 days. 10/18/18 10/25/18  Melene PlanFloyd, Warnie Belair, DO  gabapentin (NEURONTIN) 300 MG capsule Take 2 capsules (600 mg total) by mouth 3 (three) times daily. 07/15/18   Malvin JohnsFarah, Brian, MD  glipiZIDE (GLUCOTROL XL) 10 MG 24 hr tablet Take 10 mg by mouth daily. 02/14/18   [provider]  hydrocortisone (ANUSOL-HC) 2.5 % rectal cream Apply rectally 2 times daily Patient not taking: Reported on 07/12/2018 12/14/17   Garlon HatchetSanders, Lisa M, PA-C  lidocaine (XYLOCAINE) 2 % jelly 1 application by Other route as needed. Patient not taking: Reported on 07/12/2018 12/14/17   Garlon HatchetSanders, Lisa M, PA-C  lisinopril (PRINIVIL,ZESTRIL) 40 MG tablet Take 1 tablet (40 mg total) by mouth daily. 09/30/17   Fayrene [name], [name], PA-C  metFORMIN (GLUCOPHAGE) 1000 MG tablet  Take 1 tablet (1,000 mg total) by mouth 2 (two) times daily with a meal. Patient not taking: Reported on 07/12/2018 09/30/17   Fayrene [name], [name], PA-C  metFORMIN (GLUCOPHAGE-XR) 500 MG 24 hr tablet Take 500 mg by mouth daily. 02/14/18   [provider]  morphine (MSIR) 15 MG tablet Take 1 tablet (15 mg total) by mouth every 4 (four) hours as needed for severe pain. 10/18/18   Melene PlanFloyd, Lorie Melichar, DO  prazosin (MINIPRESS) 2 MG capsule Take 1 capsule (2 mg total) by mouth at bedtime. 07/15/18   Malvin JohnsFarah, Brian, MD  Prenatal Vit-Fe Fumarate-FA (PRENATAL MULTIVITAMIN) TABS tablet Take 1 tablet by mouth daily. 07/15/18   Malvin JohnsFarah, Brian, MD  primidone (MYSOLINE) 50 MG tablet Take 1 tablet (50 mg total) by mouth 3 (three) times daily. 07/15/18   Malvin JohnsFarah, Brian, MD  VENTOLIN HFA 108 [phone](90 Base) MCG/ACT inhaler Inhale 2 puffs into the lungs every 8 (eight) hours as needed for wheezing or shortness of breath.  03/10/18   [provider]  vortioxetine HBr (TRINTELLIX) 10 MG TABS tablet Take 1 tablet (10 mg total) by mouth daily. 07/16/18   Malvin JohnsFarah, Brian, MD    Family History Family History  Problem Relation Age of Onset  . Diabetes Maternal Grandmother   . Diabetes Paternal Grandmother     Social History Social History   Tobacco Use  . Smoking status: Current Every Day Smoker    Packs/day: 2.00    Years: 12.00    Pack years: 24.00    Types: Cigarettes  . Smokeless tobacco: Never Used  Substance Use Topics  . Alcohol use: No  . Drug use: No     Allergies   Patient has no known allergies.   Review of Systems Review of Systems  Constitutional: Negative for chills and fever.  HENT: Positive for dental problem. Negative for congestion and rhinorrhea.   Eyes: Negative for redness and visual disturbance.  Respiratory: Negative for shortness of breath and wheezing.   Cardiovascular: Negative for chest pain and palpitations.  Gastrointestinal: Positive for nausea. Negative for vomiting.  Genitourinary: Negative  for dysuria and urgency.  Musculoskeletal: Negative for arthralgias and myalgias.  Skin: Negative for pallor and wound.  Neurological: Negative for dizziness and headaches.     Physical Exam Updated Vital Signs BP 109/67   Pulse 85   Temp 99 F (37.2 C) (Oral)   Resp 16   SpO2 98%   Physical Exam Vitals signs and nursing note reviewed.  Constitutional:      General: She is not in acute distress.    Appearance: She is well-developed. She  is not diaphoretic.  HENT:     Head: Normocephalic and atraumatic.     Comments: Right-sided facial edema.  The patient has some fluctuance at the base of the bicuspid in the right upper portion of the mouth.  She has some pain with closing her eye and smiling.  Intact nerve movement. Eyes:     Pupils: Pupils are equal, round, and reactive to light.  Neck:     Musculoskeletal: Normal range of motion and neck supple.  Cardiovascular:     Rate and Rhythm: Normal rate and regular rhythm.     Heart sounds: No murmur. No friction rub. No gallop.   Pulmonary:     Effort: Pulmonary effort is normal.     Breath sounds: No wheezing or rales.  Abdominal:     General: There is no distension.     Palpations: Abdomen is soft.     Tenderness: There is no abdominal tenderness.  Musculoskeletal:        General: No tenderness.  Skin:    General: Skin is warm and dry.  Neurological:     Mental Status: She is alert and oriented to person, place, and time.  Psychiatric:        Behavior: Behavior normal.      ED Treatments / Results  Labs (all labs ordered are listed, but only abnormal results are displayed) Labs Reviewed  I-STAT CHEM 8, ED - Abnormal; Notable for the following components:      Result Value   Creatinine, Ser 0.40 (*)    Glucose, Bld 183 (*)    All other components within normal limits    EKG None  Radiology Ct Maxillofacial W Contrast  Result Date: 10/18/2018 CLINICAL DATA:  Right facial swelling.  Rule out dental  abscess. EXAM: CT MAXILLOFACIAL WITH CONTRAST TECHNIQUE: Multidetector CT imaging of the maxillofacial structures was performed with intravenous contrast. Multiplanar CT image reconstructions were also generated. CONTRAST:  48mL OMNIPAQUE IOHEXOL 300 MG/ML  SOLN COMPARISON:  CT head 09/30/2017 FINDINGS: Osseous: Caries right upper pre molar with periapical lucency. Lateral cortical erosion of the maxilla at this tooth. Adjacent rim enhancing soft tissue fluid collection lateral to the maxilla this level consistent with abscess measuring 3 x 6 mm. Caries left upper third molar.  No acute fracture. Orbits: Negative for soft tissue mass or edema Sinuses: Mucosal edema right maxillary sinus. Mild mucosal edema left maxillary sinus. No air-fluid levels. Soft tissues: Soft tissue swelling right face. 3 x 6 mm subperiosteal abscess lateral to right maxilla and pre molar. Limited intracranial: Negative IMPRESSION: Soft tissue swelling right face due to cellulitis. Subperiosteal abscess 3 x 6 mm lateral to the right maxilla. Associated dental infection right upper premolar with periapical abscess and lateral cortical erosion. Electronically Signed   By: Franchot Gallo M.D.   On: 10/18/2018 21:44    Procedures Dental Block  Date/Time: 10/18/2018 6:28 PM Performed by: Deno Etienne, DO Authorized by: Deno Etienne, DO   Consent:    Consent obtained:  Verbal   Consent given by:  Patient   Risks discussed:  Allergic reaction, infection, hematoma, intravascular injection, nerve damage, pain, swelling and unsuccessful block   Alternatives discussed:  No treatment, delayed treatment, alternative treatment and referral Indications:    Indications: dental abscess   Location:    Anesthesia block type: infraorbital. Procedure details (see MAR for exact dosages):    Syringe type:  Aspirating dental syringe   Needle gauge:  27 G   Anesthetic  injected:  Bupivacaine 0.25% WITH epi   Injection procedure:  Anatomic landmarks  identified, anatomic landmarks palpated, introduced needle, negative aspiration for blood and incremental injection Post-procedure details:    Outcome:  Pain improved   Patient tolerance of procedure:  Tolerated well, no immediate complications .Marland KitchenIncision and Drainage  Date/Time: 10/18/2018 6:29 PM Performed by: Melene Plan, DO Authorized by: Melene Plan, DO   Consent:    Consent obtained:  Verbal   Consent given by:  Patient   Risks discussed:  Bleeding, incomplete drainage, damage to other organs and infection   Alternatives discussed:  No treatment Location:    Type:  Abscess   Location:  Mouth   Mouth location:  Alveolar process Pre-procedure details:    Skin preparation:  Chloraprep Sedation:    Sedation type:  Moderate (conscious) sedation Anesthesia (see MAR for exact dosages):    Anesthesia method:  Nerve block   Block location:  Inferior orbital   Block needle gauge:  27 G   Block anesthetic:  Bupivacaine 0.25% w/o epi   Block injection procedure:  Anatomic landmarks identified and anatomic landmarks palpated   Block outcome:  Incomplete block Procedure type:    Complexity:  Simple Procedure details:    Incision types:  Single straight   Incision depth:  Submucosal   Drainage:  Purulent   Drainage amount:  Scant   Wound treatment:  Wound left open   Packing materials:  None Post-procedure details:    Patient tolerance of procedure:  Tolerated well, no immediate complications Comments:     A scant amount of purulent material was drained from the area after local numbing with bupivacaine.  Repeat stab incisions with a 18-gauge needle without significant purulence.   (including critical care time)  Medications Ordered in ED Medications  ketorolac (TORADOL) 15 MG/ML injection 15 mg (has no administration in time range)  oxyCODONE (Oxy IR/ROXICODONE) immediate release tablet 5 mg (has no administration in time range)  fentaNYL (SUBLIMAZE) injection 100 mcg (100 mcg  Intramuscular Given 10/18/18 1643)  acetaminophen (TYLENOL) tablet 1,000 mg (1,000 mg Oral Given 10/18/18 1643)  bupivacaine-epinephrine (MARCAINE W/ EPI) 0.5% -1:200000 injection 1.8 mL (1.8 mLs Infiltration Given 10/18/18 1700)  fentaNYL (SUBLIMAZE) injection 100 mcg (100 mcg Intravenous Given 10/18/18 1820)  clindamycin (CLEOCIN) IVPB 900 mg (0 mg Intravenous Stopped 10/18/18 2008)  iohexol (OMNIPAQUE) 300 MG/ML solution 75 mL (75 mLs Intravenous Contrast Given 10/18/18 2117)  fentaNYL (SUBLIMAZE) injection 100 mcg (100 mcg Intravenous Given 10/18/18 2150)     Initial Impression / Assessment and Plan / ED Course  I have reviewed the triage vital signs and the nursing notes.  Pertinent labs & imaging results that were available during my care of the patient were reviewed by me and considered in my medical decision making (see chart for details).        38 yo F with a chief complaint of dental pain.  Patient likely has a dental abscess based on exam.  Will perform the infraorbital nerve block and try to I&D the area.  I&D without large amount of purulence.  Will obtain a CT scan of the face to evaluate for other possible process.  CT scan with a periapical abscess about tooth #16.  As the patient does not have a dentist will discuss with dentistry on call.   I discussed case with Dr. Leanord Asal, dentistry agreed with the antibiotic choice.  Recommended that she call the office in the morning to set an appointment to  that she can be seen. 10:14 PM:  I have discussed the diagnosis/risks/treatment options with the patient and believe the pt to be eligible for discharge home to follow-up with PCP, dentistry. We also discussed returning to the ED immediately if new or worsening sx occur. We discussed the sx which are most concerning (e.g., sudden worsening pain, fever, inability to tolerate by mouth) that necessitate immediate return. Medications administered to the patient during their visit and any new  prescriptions provided to the patient are listed below.  Medications given during this visit Medications  ketorolac (TORADOL) 15 MG/ML injection 15 mg (has no administration in time range)  oxyCODONE (Oxy IR/ROXICODONE) immediate release tablet 5 mg (has no administration in time range)  fentaNYL (SUBLIMAZE) injection 100 mcg (100 mcg Intramuscular Given 10/18/18 1643)  acetaminophen (TYLENOL) tablet 1,000 mg (1,000 mg Oral Given 10/18/18 1643)  bupivacaine-epinephrine (MARCAINE W/ EPI) 0.5% -1:200000 injection 1.8 mL (1.8 mLs Infiltration Given 10/18/18 1700)  fentaNYL (SUBLIMAZE) injection 100 mcg (100 mcg Intravenous Given 10/18/18 1820)  clindamycin (CLEOCIN) IVPB 900 mg (0 mg Intravenous Stopped 10/18/18 2008)  iohexol (OMNIPAQUE) 300 MG/ML solution 75 mL (75 mLs Intravenous Contrast Given 10/18/18 2117)  fentaNYL (SUBLIMAZE) injection 100 mcg (100 mcg Intravenous Given 10/18/18 2150)     The patient appears reasonably screen and/or stabilized for discharge and I doubt any other medical condition or other [facility] requiring further screening, evaluation, or treatment in the ED at this time prior to discharge.    Final Clinical Impressions(s) / ED Diagnoses   Final diagnoses:  Dental abscess    ED Discharge Orders         Ordered    clindamycin (CLEOCIN) 150 MG capsule  3 times daily     10/18/18 2208    morphine (MSIR) 15 MG tablet  Every 4 hours PRN     10/18/18 2208           Melene Plan, DO 10/18/18 2214

## 2018-10-18 NOTE — Discharge Instructions (Addendum)
You have a small pocket of infection at the base of 1 of your teeth.  Take the antibiotics as prescribed.  I discussed your case with the dentist that is on-call tonight and they are willing to see you in the office.  They asked that you call the office in the morning to schedule an appointment.  Please return to the ED for fever or inability to swallow.  Take 4 over the counter ibuprofen tablets 3 times a day or 2 over-the-counter naproxen tablets twice a day for pain. Also take tylenol 1000mg (2 extra strength) four times a day.   Then take the pain medicine if you feel like you need it. Narcotics do not help with the pain, they only make you care about it less.  You can become addicted to this, people may break into your house to steal it.  It will constipate you.  If you drive under the influence of this medicine you can get a DUI.

## 2018-10-18 NOTE — ED Triage Notes (Signed)
Pt arrives to ED with a dental abscess to right side of face with significant swelling - pt was started on penicillin but swelling is worse today moving toward her right eye and into nose.

## 2018-10-18 NOTE — ED Notes (Signed)
Patient transported to CT 

## 2018-11-08 DIAGNOSIS — H35033 Hypertensive retinopathy, bilateral: Secondary | ICD-10-CM | POA: Insufficient documentation

## 2018-11-08 DIAGNOSIS — G245 Blepharospasm: Secondary | ICD-10-CM | POA: Insufficient documentation

## 2018-11-08 DIAGNOSIS — H2513 Age-related nuclear cataract, bilateral: Secondary | ICD-10-CM | POA: Insufficient documentation

## 2019-01-13 DIAGNOSIS — J189 Pneumonia, unspecified organism: Secondary | ICD-10-CM

## 2019-01-13 HISTORY — DX: Pneumonia, unspecified organism: J18.9

## 2021-02-12 ENCOUNTER — Other Ambulatory Visit: Payer: Self-pay

## 2021-02-12 ENCOUNTER — Ambulatory Visit
Admission: EM | Admit: 2021-02-12 | Discharge: 2021-02-12 | Disposition: A | Discharge status: Home / Self Care | Payer: Self-pay | Attending: Internal Medicine | Admitting: Internal Medicine

## 2021-02-12 ENCOUNTER — Ambulatory Visit (INDEPENDENT_AMBULATORY_CARE_PROVIDER_SITE_OTHER): Payer: Self-pay

## 2021-02-12 DIAGNOSIS — M25521 Pain in right elbow: Secondary | ICD-10-CM

## 2021-02-12 MED ORDER — KETOROLAC TROMETHAMINE 30 MG/ML IJ SOLN
30.0000 mg | Freq: Once | INTRAMUSCULAR | Status: AC
  Administered 2021-02-12: 30 mg via INTRAMUSCULAR

## 2021-02-12 NOTE — Discharge Instructions (Signed)
Your x-ray was negative.  Please use ice application to affected area.  You were given ketorolac injection in urgent care today.  Please avoid taking any ibuprofen, Advil, Aleve for at least 24 hours following this injection.  Please follow-up with provided contact information for orthopedist for further evaluation and management.

## 2021-02-12 NOTE — ED Triage Notes (Signed)
Pt c/o severe elbow pain onset ~ Monday states when she tries to bend it pain radiates down arm. States unknown direct trauma to the area she just woke up and it was in pain. She also states later she went to lift grocery bag and heard/felt a "pop" in the elbow.   States has not been taken meds for ~ 1 year now r/t lack of insurance but has insurance now.

## 2021-02-12 NOTE — ED Provider Notes (Signed)
[name]    CSN: [id number] Arrival date & time: 02/12/21  0808      History   Chief Complaint Chief Complaint  Patient presents with   right elbow pain    HPI [name] [name] is a 41 y.o. female.   Patient presents with 3-day history of right elbow pain.  Denies any obvious injury.  She reports that she woke up with pain.  Pain occurs in the posterior elbow and radiates around to her anterior elbow.  She reports that she picked up some grocery bags after her pain started and heard a "pop".  Denies any numbness or tingling.  Does report that she "broke her funny bone" in the past in the same elbow.  Denies taking any medications for pain just yet.    Past Medical History:  Diagnosis Date   Anxiety    Back fracture    Depression    Diabetes mellitus without complication (HCC)    Family history of adverse reaction to anesthesia    sister gets nausea and vomitting and itching on her face. Around her nose and mouth.   Fatty liver disease, nonalcoholic    GERD (gastroesophageal reflux disease)    H/O cesarean section complicating pregnancy 04/18/2014   Hypertension    Neuromuscular disorder (HCC)    eye twitch on left eye    Obesity    Pelvis fracture (HCC)    S/P cesarean section 06/01/2014   S/P tubal ligation 06/01/2014    Patient Active Problem List   Diagnosis Date Noted   MDD (major depressive disorder), severe (HCC) 07/12/2018   S/P cesarean section 06/01/2014   S/P tubal ligation 06/01/2014   H/O cesarean section complicating pregnancy 04/18/2014   Anxiety 03/16/2013   Smoking 03/16/2013   Acute chest pain 03/01/2013    Past Surgical History:  Procedure Laterality Date   CESAREAN SECTION     CESAREAN SECTION WITH BILATERAL TUBAL LIGATION Bilateral 06/01/2014   Procedure: CESAREAN SECTION WITH BILATERAL TUBAL LIGATION;  Surgeon: Sherian Rein, MD;  Location: [facility];  Service: Obstetrics;  Laterality: Bilateral;  MD requests RNFA   CHEST  TUBE INSERTION     TONSILLECTOMY     TRACHEOSTOMY      OB History     Gravida  2   Para  2   Term  2   Preterm      AB      Living  2      SAB      IAB      Ectopic      Multiple  0   Live Births  2            Home Medications    Prior to Admission medications   Medication Sig Start Date End Date Taking? Authorizing Provider  aspirin-acetaminophen-caffeine (EXCEDRIN MIGRAINE) [phone] MG tablet Take 4 tablets by mouth every 6 (six) hours as needed for headache.    [provider]  atorvastatin (LIPITOR) 10 MG tablet Take 10 mg by mouth daily. 03/10/18   [provider]  gabapentin (NEURONTIN) 300 MG capsule Take 2 capsules (600 mg total) by mouth 3 (three) times daily. 07/15/18   Malvin Johns, MD  glipiZIDE (GLUCOTROL XL) 10 MG 24 hr tablet Take 10 mg by mouth daily. 02/14/18   [provider]  hydrocortisone (ANUSOL-HC) 2.5 % rectal cream Apply rectally 2 times daily Patient not taking: Reported on 07/12/2018 12/14/17   Garlon Hatchet, PA-C  lidocaine (  XYLOCAINE) 2 % jelly 1 application by Other route as needed. Patient not taking: Reported on 07/12/2018 12/14/17   Garlon HatchetSanders, Lisa M, PA-C  lisinopril (PRINIVIL,ZESTRIL) 40 MG tablet Take 1 tablet (40 mg total) by mouth daily. 09/30/17   Fayrene [name], [name], PA-C  metFORMIN (GLUCOPHAGE) 1000 MG tablet Take 1 tablet (1,000 mg total) by mouth 2 (two) times daily with a meal. Patient not taking: Reported on 07/12/2018 09/30/17   Fayrene [name], [name], PA-C  metFORMIN (GLUCOPHAGE-XR) 500 MG 24 hr tablet Take 500 mg by mouth daily. 02/14/18   [provider]  morphine (MSIR) 15 MG tablet Take 1 tablet (15 mg total) by mouth every 4 (four) hours as needed for severe pain. 10/18/18   Melene PlanFloyd, Dan, DO  prazosin (MINIPRESS) 2 MG capsule Take 1 capsule (2 mg total) by mouth at bedtime. 07/15/18   Malvin JohnsFarah, Brian, MD  Prenatal Vit-Fe Fumarate-FA (PRENATAL MULTIVITAMIN) TABS tablet Take 1 tablet by mouth daily. 07/15/18   Malvin JohnsFarah,  Brian, MD  primidone (MYSOLINE) 50 MG tablet Take 1 tablet (50 mg total) by mouth 3 (three) times daily. 07/15/18   Malvin JohnsFarah, Brian, MD  VENTOLIN HFA 108 [phone](90 Base) MCG/ACT inhaler Inhale 2 puffs into the lungs every 8 (eight) hours as needed for wheezing or shortness of breath.  03/10/18   [provider]  vortioxetine HBr (TRINTELLIX) 10 MG TABS tablet Take 1 tablet (10 mg total) by mouth daily. 07/16/18   Malvin JohnsFarah, Brian, MD    Family History Family History  Problem Relation Age of Onset   Diabetes Maternal Grandmother    Diabetes Paternal Grandmother     Social History Social History   Tobacco Use   Smoking status: Every Day    Packs/day: 2.00    Years: 12.00    Pack years: 24.00    Types: Cigarettes   Smokeless tobacco: Never  Vaping Use   Vaping Use: Never used  Substance Use Topics   Alcohol use: No   Drug use: No     Allergies   Patient has no known allergies.   Review of Systems Review of Systems Per HPI  Physical Exam Triage Vital Signs ED Triage Vitals  Enc Vitals Group     BP 02/12/21 0824 (!) 177/94     Pulse Rate 02/12/21 0824 95     Resp 02/12/21 0824 18     Temp 02/12/21 0824 98 F (36.7 C)     Temp Source 02/12/21 0824 Oral     SpO2 02/12/21 0824 98 %     Weight --      Height --      Head Circumference --      Peak Flow --      Pain Score 02/12/21 0825 0     Pain Loc --      Pain Edu? --      Excl. in GC? --    No data found.  Updated Vital Signs BP (!) 149/91    Pulse 95    Temp 98 F (36.7 C) (Oral)    Resp 18    SpO2 98%   Visual Acuity Right Eye Distance:   Left Eye Distance:   Bilateral Distance:    Right Eye Near:   Left Eye Near:    Bilateral Near:     Physical Exam Constitutional:      General: She is not in acute distress.    Appearance: Normal appearance. She is not toxic-appearing or diaphoretic.  HENT:  Head: Normocephalic and atraumatic.  Eyes:     Extraocular Movements: Extraocular movements intact.      Conjunctiva/sclera: Conjunctivae normal.  Pulmonary:     Effort: Pulmonary effort is normal.  Musculoskeletal:     Right elbow: No swelling, deformity or effusion. Decreased range of motion. Tenderness present.     Left elbow: Normal.     Comments: Tenderness to palpation to right posterior elbow directly above the olecranon that radiates around to anterior elbow.  Limited range of motion and patient is not able to fully extend elbow due to pain.  Unable to supinate or pronate as well.  Neurovascular intact.  Grip strength 5/5.  Neurological:     General: No focal deficit present.     Mental Status: She is alert and oriented to person, place, and time. Mental status is at baseline.  Psychiatric:        Mood and Affect: Mood normal.        Behavior: Behavior normal.        Thought Content: Thought content normal.        Judgment: Judgment normal.     UC Treatments / Results  Labs (all labs ordered are listed, but only abnormal results are displayed) Labs Reviewed - No data to display  EKG   Radiology DG Elbow Complete Right  Result Date: 02/12/2021 CLINICAL DATA:  Right elbow pain. EXAM: RIGHT ELBOW - COMPLETE 3+ VIEW COMPARISON:  None. FINDINGS: No joint effusion. No fracture or dislocation identified. No significant arthropathy. Soft tissues are unremarkable. IMPRESSION: Negative. Electronically Signed   By: Signa Kell M.D.   On: 02/12/2021 08:55    Procedures Procedures (including critical care time)  Medications Ordered in UC Medications  ketorolac (TORADOL) 30 MG/ML injection 30 mg (30 mg Intramuscular Given 02/12/21 0918)    Initial Impression / Assessment and Plan / UC Course  I have reviewed the triage vital signs and the nursing notes.  Pertinent labs & imaging results that were available during my care of the patient were reviewed by me and considered in my medical decision making (see chart for details).     Right elbow x-ray was negative for any acute bony  abnormality.  Suspect muscular injury, strain/inflammation.  Patient to use ice application.  Discussed supportive care with patient.  Ketorolac injection administered in urgent care today as patient reports that she does not take any daily prescription medicines and this would not provide any interactions with her daily medicines.  Discussed over-the-counter pain relievers.  Patient to follow-up with provided contact information for orthopedist for further evaluation and management.  Discussed return precautions.  Patient verbalized understanding and was agreeable with plan. Final Clinical Impressions(s) / UC Diagnoses   Final diagnoses:  Right elbow pain     Discharge Instructions      Your x-ray was negative.  Please use ice application to affected area.  You were given ketorolac injection in urgent care today.  Please avoid taking any ibuprofen, Advil, Aleve for at least 24 hours following this injection.  Please follow-up with provided contact information for orthopedist for further evaluation and management.     ED Prescriptions   None    PDMP not reviewed this encounter.   Gustavus Bryant, [place] 02/12/21 [phone]

## 2021-07-28 NOTE — Progress Notes (Signed)
Subjective:    [name] [name] - 41 y.o. female MRN [id number]  Date of birth: 06/18/1980  HPI  [name] [name] is to establish care.   Current issues and/or concerns: - History of diabetes type 2. Has not taken Metformin, Glipizide, or Gabapentin in at least 18 months. Reports she self-discontinued medications due to she thought she lost health insurance coverage. Reports January 2023 she found out she actually never lost health insurance 18 months ago. She is trying to monitor what she eats and exercise.  - History of hypertension. Has not taken Lisinopril due to previously mentioned health insurance concern.  - History of anxiety, depression, and PTSD. She is not taking any medications due to previously mentioned health insurance concern. Reports she feels well and does not want to resume any previously prescribed medications. - Persisting vaginal itching with discharge. Has tried several over-the-counter medications without relief.   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   Hypertensive retinopathy of both eyes 11/08/2018   Nuclear age-related cataract, both eyes 11/08/2018   MDD (major depressive disorder), recurrent episode, moderate (HCC) 08/04/2018   PTSD (post-traumatic stress disorder) 08/04/2018   MDD (major depressive disorder), severe (HCC) 07/12/2018   GERD (gastroesophageal reflux disease) 02/17/2018   Type 2 diabetes mellitus without complication, without long-term current use of insulin (HCC) 02/17/2018   S/P cesarean section 06/01/2014   S/P tubal ligation 06/01/2014   H/O cesarean section complicating pregnancy 04/18/2014   Emotional disorder 03/16/2013    Tobacco user 03/16/2013   Acute chest pain 03/01/2013   HTN (hypertension) 09/28/2012   DM (diabetes mellitus) (HCC) 11/06/2011      Social History   reports that she has been smoking cigarettes. She has a 24.00 pack-year smoking history. She has been exposed to tobacco smoke. She has never used smokeless tobacco. She reports that she does not drink alcohol and does not use drugs.   Family History  family history includes Diabetes in her maternal grandmother and paternal grandmother.   Medications: reviewed and updated   Objective:   Physical Exam BP (!) 143/82 (BP Location: Left Arm, Patient Position: Sitting, Cuff Size: Large)   Pulse 89   Temp 98.3 F (36.8 C)   Resp 16   Ht 5' 2.21" (1.58 m)   Wt 180 lb (81.6 kg)   SpO2 94%   BMI 32.71 kg/m   Physical Exam HENT:     Head: Normocephalic and atraumatic.  Eyes:     Extraocular Movements: Extraocular movements intact.     Conjunctiva/sclera: Conjunctivae normal.     Pupils: Pupils are equal, round, and reactive to light.  Cardiovascular:     Rate and Rhythm: Normal rate and regular rhythm.     Pulses: Normal pulses.     Heart sounds: Normal heart sounds.  Pulmonary:     Effort: Pulmonary effort is normal.     Breath sounds: Normal breath sounds.  Musculoskeletal:     Cervical back: Normal range of motion and neck supple.  Neurological:     General: No focal deficit present.     Mental Status: She is   Subjective:    [name] [name] - 41 y.o. female MRN [id number]  Date of birth: 06/18/1980  HPI  [name] [name] is to establish care.   Current issues and/or concerns: - History of diabetes type 2. Has not taken Metformin, Glipizide, or Gabapentin in at least 18 months. Reports she self-discontinued medications due to she thought she lost health insurance coverage. Reports January 2023 she found out she actually never lost health insurance 18 months ago. She is trying to monitor what she eats and exercise.  - History of hypertension. Has not taken Lisinopril due to previously mentioned health insurance concern.  - History of anxiety, depression, and PTSD. She is not taking any medications due to previously mentioned health insurance concern. Reports she feels well and does not want to resume any previously prescribed medications. - Persisting vaginal itching with discharge. Has tried several over-the-counter medications without relief.   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   Hypertensive retinopathy of both eyes 11/08/2018   Nuclear age-related cataract, both eyes 11/08/2018   MDD (major depressive disorder), recurrent episode, moderate (HCC) 08/04/2018   PTSD (post-traumatic stress disorder) 08/04/2018   MDD (major depressive disorder), severe (HCC) 07/12/2018   GERD (gastroesophageal reflux disease) 02/17/2018   Type 2 diabetes mellitus without complication, without long-term current use of insulin (HCC) 02/17/2018   S/P cesarean section 06/01/2014   S/P tubal ligation 06/01/2014   H/O cesarean section complicating pregnancy 04/18/2014   Emotional disorder 03/16/2013    Tobacco user 03/16/2013   Acute chest pain 03/01/2013   HTN (hypertension) 09/28/2012   DM (diabetes mellitus) (HCC) 11/06/2011      Social History   reports that she has been smoking cigarettes. She has a 24.00 pack-year smoking history. She has been exposed to tobacco smoke. She has never used smokeless tobacco. She reports that she does not drink alcohol and does not use drugs.   Family History  family history includes Diabetes in her maternal grandmother and paternal grandmother.   Medications: reviewed and updated   Objective:   Physical Exam BP (!) 143/82 (BP Location: Left Arm, Patient Position: Sitting, Cuff Size: Large)   Pulse 89   Temp 98.3 F (36.8 C)   Resp 16   Ht 5' 2.21" (1.58 m)   Wt 180 lb (81.6 kg)   SpO2 94%   BMI 32.71 kg/m   Physical Exam HENT:     Head: Normocephalic and atraumatic.  Eyes:     Extraocular Movements: Extraocular movements intact.     Conjunctiva/sclera: Conjunctivae normal.     Pupils: Pupils are equal, round, and reactive to light.  Cardiovascular:     Rate and Rhythm: Normal rate and regular rhythm.     Pulses: Normal pulses.     Heart sounds: Normal heart sounds.  Pulmonary:     Effort: Pulmonary effort is normal.     Breath sounds: Normal breath sounds.  Musculoskeletal:     Cervical back: Normal range of motion and neck supple.  Neurological:     General: No focal deficit present.     Mental Status: She is   Subjective:    [name] [name] - 41 y.o. female MRN [id number]  Date of birth: 06/18/1980  HPI  [name] [name] is to establish care.   Current issues and/or concerns: - History of diabetes type 2. Has not taken Metformin, Glipizide, or Gabapentin in at least 18 months. Reports she self-discontinued medications due to she thought she lost health insurance coverage. Reports January 2023 she found out she actually never lost health insurance 18 months ago. She is trying to monitor what she eats and exercise.  - History of hypertension. Has not taken Lisinopril due to previously mentioned health insurance concern.  - History of anxiety, depression, and PTSD. She is not taking any medications due to previously mentioned health insurance concern. Reports she feels well and does not want to resume any previously prescribed medications. - Persisting vaginal itching with discharge. Has tried several over-the-counter medications without relief.   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   Hypertensive retinopathy of both eyes 11/08/2018   Nuclear age-related cataract, both eyes 11/08/2018   MDD (major depressive disorder), recurrent episode, moderate (HCC) 08/04/2018   PTSD (post-traumatic stress disorder) 08/04/2018   MDD (major depressive disorder), severe (HCC) 07/12/2018   GERD (gastroesophageal reflux disease) 02/17/2018   Type 2 diabetes mellitus without complication, without long-term current use of insulin (HCC) 02/17/2018   S/P cesarean section 06/01/2014   S/P tubal ligation 06/01/2014   H/O cesarean section complicating pregnancy 04/18/2014   Emotional disorder 03/16/2013    Tobacco user 03/16/2013   Acute chest pain 03/01/2013   HTN (hypertension) 09/28/2012   DM (diabetes mellitus) (HCC) 11/06/2011      Social History   reports that she has been smoking cigarettes. She has a 24.00 pack-year smoking history. She has been exposed to tobacco smoke. She has never used smokeless tobacco. She reports that she does not drink alcohol and does not use drugs.   Family History  family history includes Diabetes in her maternal grandmother and paternal grandmother.   Medications: reviewed and updated   Objective:   Physical Exam BP (!) 143/82 (BP Location: Left Arm, Patient Position: Sitting, Cuff Size: Large)   Pulse 89   Temp 98.3 F (36.8 C)   Resp 16   Ht 5' 2.21" (1.58 m)   Wt 180 lb (81.6 kg)   SpO2 94%   BMI 32.71 kg/m   Physical Exam HENT:     Head: Normocephalic and atraumatic.  Eyes:     Extraocular Movements: Extraocular movements intact.     Conjunctiva/sclera: Conjunctivae normal.     Pupils: Pupils are equal, round, and reactive to light.  Cardiovascular:     Rate and Rhythm: Normal rate and regular rhythm.     Pulses: Normal pulses.     Heart sounds: Normal heart sounds.  Pulmonary:     Effort: Pulmonary effort is normal.     Breath sounds: Normal breath sounds.  Musculoskeletal:     Cervical back: Normal range of motion and neck supple.  Neurological:     General: No focal deficit present.     Mental Status: She is

## 2021-08-06 ENCOUNTER — Ambulatory Visit (INDEPENDENT_AMBULATORY_CARE_PROVIDER_SITE_OTHER): Payer: No Typology Code available for payment source | Admitting: Family

## 2021-08-06 ENCOUNTER — Other Ambulatory Visit (HOSPITAL_COMMUNITY)
Admission: RE | Admit: 2021-08-06 | Discharge: 2021-08-06 | Disposition: A | Discharge status: Home / Self Care | Payer: No Typology Code available for payment source | Source: Ambulatory Visit | Attending: Family | Admitting: Family

## 2021-08-06 ENCOUNTER — Encounter: Payer: Self-pay | Admitting: Family

## 2021-08-06 VITALS — BP 143/82 | HR 89 | Temp 98.3°F | Resp 16 | Ht 62.21 in | Wt 180.0 lb

## 2021-08-06 DIAGNOSIS — Z794 Long term (current) use of insulin: Secondary | ICD-10-CM | POA: Diagnosis not present

## 2021-08-06 DIAGNOSIS — F32A Depression, unspecified: Secondary | ICD-10-CM

## 2021-08-06 DIAGNOSIS — I10 Essential (primary) hypertension: Secondary | ICD-10-CM

## 2021-08-06 DIAGNOSIS — E1141 Type 2 diabetes mellitus with diabetic mononeuropathy: Secondary | ICD-10-CM

## 2021-08-06 DIAGNOSIS — F4310 Post-traumatic stress disorder, unspecified: Secondary | ICD-10-CM

## 2021-08-06 DIAGNOSIS — N898 Other specified noninflammatory disorders of vagina: Secondary | ICD-10-CM | POA: Diagnosis present

## 2021-08-06 DIAGNOSIS — Z7689 Persons encountering health services in other specified circumstances: Secondary | ICD-10-CM

## 2021-08-06 DIAGNOSIS — F419 Anxiety disorder, unspecified: Secondary | ICD-10-CM | POA: Diagnosis not present

## 2021-08-06 DIAGNOSIS — B3731 Acute candidiasis of vulva and vagina: Secondary | ICD-10-CM

## 2021-08-06 LAB — POCT URINALYSIS DIP (CLINITEK)
Bilirubin, UA: NEGATIVE
Blood, UA: NEGATIVE
Glucose, UA: >=1000 mg/dL — AB
Leukocytes, UA: NEGATIVE
Nitrite, UA: NEGATIVE
POC PROTEIN,UA: 30 — AB
Spec Grav, UA: 1.02 (ref 1.010–1.025)
Urobilinogen, UA: 0.2 E.U./dL
pH, UA: 7 (ref 5.0–8.0)

## 2021-08-06 LAB — POCT GLYCOSYLATED HEMOGLOBIN (HGB A1C): Hemoglobin A1C: 11 % — AB (ref 4.0–5.6)

## 2021-08-06 MED ORDER — GABAPENTIN 300 MG PO CAPS: 300.0000 mg | ORAL_CAPSULE | Freq: Three times a day (TID) | ORAL | 2 refills | Status: DC

## 2021-08-06 MED ORDER — INSULIN GLARGINE 100 UNIT/ML ~~LOC~~ SOLN: 10.0000 [IU] | Freq: Every day | SUBCUTANEOUS | 0 refills | Status: DC

## 2021-08-06 MED ORDER — LISINOPRIL 10 MG PO TABS: 10.0000 mg | ORAL_TABLET | Freq: Every day | ORAL | 2 refills | Status: DC

## 2021-08-06 MED ORDER — ACCU-CHEK GUIDE W/DEVICE KIT: 1.0000 | PACK | Freq: Three times a day (TID) | 0 refills | Status: DC

## 2021-08-06 MED ORDER — ACCU-CHEK GUIDE VI STRP: ORAL_STRIP | 12 refills | Status: DC

## 2021-08-06 MED ORDER — PEN NEEDLES 31G X 8 MM MISC: 0 refills | Status: DC

## 2021-08-06 MED ORDER — METFORMIN HCL ER 500 MG PO TB24: 500.0000 mg | ORAL_TABLET | Freq: Two times a day (BID) | ORAL | 0 refills | Status: DC

## 2021-08-06 MED ORDER — ACCU-CHEK MULTICLIX LANCETS MISC: 12 refills | Status: AC

## 2021-08-06 MED ORDER — FLUCONAZOLE 150 MG PO TABS: 150.0000 mg | ORAL_TABLET | Freq: Once | ORAL | 0 refills | Status: AC

## 2021-08-06 NOTE — Progress Notes (Signed)
Pt presents to establish care, states she has been experiencing vaginal itching and discharge  Diabetic pt has not had medication in 1 1/2 years

## 2021-08-07 ENCOUNTER — Telehealth: Payer: Self-pay | Admitting: Family

## 2021-08-07 ENCOUNTER — Other Ambulatory Visit: Payer: Self-pay | Admitting: Family

## 2021-08-07 DIAGNOSIS — E1141 Type 2 diabetes mellitus with diabetic mononeuropathy: Secondary | ICD-10-CM

## 2021-08-07 LAB — CMP14+EGFR
ALT: 9 IU/L (ref 0–32)
AST: 9 IU/L (ref 0–40)
Albumin/Globulin Ratio: 1.7 (ref 1.2–2.2)
Albumin: 4 g/dL (ref 3.9–4.9)
Alkaline Phosphatase: 81 IU/L (ref 44–121)
BUN/Creatinine Ratio: 19 (ref 9–23)
BUN: 11 mg/dL (ref 6–24)
Bilirubin Total: <0.2 mg/dL (ref 0.0–1.2)
CO2: 21 mmol/L (ref 20–29)
Calcium: 9.4 mg/dL (ref 8.7–10.2)
Chloride: 100 mmol/L (ref 96–106)
Creatinine, Ser: 0.59 mg/dL (ref 0.57–1.00)
Globulin, Total: 2.4 g/dL (ref 1.5–4.5)
Glucose: 374 mg/dL — ABNORMAL HIGH (ref 70–99)
Potassium: 4.6 mmol/L (ref 3.5–5.2)
Sodium: 137 mmol/L (ref 134–144)
Total Protein: 6.4 g/dL (ref 6.0–8.5)
eGFR: 115 mL/min/{1.73_m2} (ref >59)

## 2021-08-07 LAB — CERVICOVAGINAL ANCILLARY ONLY
Bacterial Vaginitis (gardnerella): NEGATIVE
Candida Glabrata: NEGATIVE
Candida Vaginitis: POSITIVE — AB
Chlamydia: NEGATIVE
Comment: NEGATIVE
Comment: NEGATIVE
Comment: NEGATIVE
Comment: NEGATIVE
Comment: NEGATIVE
Comment: NORMAL
Neisseria Gonorrhea: NEGATIVE
Trichomonas: NEGATIVE

## 2021-08-07 MED ORDER — INSULIN DETEMIR 100 UNIT/ML ~~LOC~~ SOLN: 10.0000 [IU] | Freq: Every day | SUBCUTANEOUS | 0 refills | Status: DC

## 2021-08-07 NOTE — Telephone Encounter (Signed)
-   Insulin Detemir (Levemir) ordered.  - Insulin Glargine (Lantus) discontinued.

## 2021-08-07 NOTE — Telephone Encounter (Signed)
Copied from CRM [id number]. Topic: General - Other >> Aug 07, 2021 10:37 AM Macon Large wrote: Reason for CRM: Pt reports that she was informed by her pharmacy that the Rx for insulin needs prior authorization. Cb# [phone]

## 2021-08-08 NOTE — Telephone Encounter (Signed)
Contacted pt aware of change to insulin

## 2021-08-11 NOTE — Progress Notes (Signed)
Patient ID: [name] [name], female    DOB: 12-Jun-1980  MRN: [id number]  CC: Chronic Care Management   Subjective: [name] [name] is a 41 y.o. female who presents for chronic care management.   Her concerns today include:  - Taking Metformin as prescribed without issues or concerns. Reports Levemir not covered by her insurance and had to pay $65 out of pocket. Blood sugars ranging 150's - 200's. - Doing well on blood pressure medication without issues or concerns.  - Headaches located frontally. Has tried several over-the-counter medications without relief. Denies worst headache of life and additional red flag symptoms. - Recently having more stress. She is going through a separation with her husband. Also, work-related stress where she is an International aid/development worker at [employer]. Denies current thoughts of self-harm, suicidal ideations, and homicidal ideations. States sometimes she thinks it would be better if she weren't here. States "I am over this world." Patient goes on to state that she wouldn't do anything to harm herself because she has children to live for ages 52 years-old and 43 years-old. History of anxiety depression and bipolar. Reports she doesn't like taking medications for the same because doesn't like how it makes her feel. - Would like to quit smoking.   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   Hypertensive retinopathy of both eyes 11/08/2018   Nuclear age-related cataract, both eyes 11/08/2018   MDD (major depressive disorder), recurrent episode, moderate (HCC) 08/04/2018   PTSD (post-traumatic stress disorder) 08/04/2018   MDD (major depressive disorder), severe (HCC) 07/12/2018   GERD (gastroesophageal reflux disease) 02/17/2018   Type 2 diabetes mellitus without complication, without long-term current use of insulin (HCC) 02/17/2018   S/P cesarean section 06/01/2014   S/P tubal ligation 06/01/2014   H/O cesarean section  complicating pregnancy 04/18/2014   Emotional disorder 03/16/2013   Tobacco user 03/16/2013   Acute chest pain 03/01/2013   HTN (hypertension) 09/28/2012   DM (diabetes mellitus) (HCC) 11/06/2011     Current Outpatient Medications on File Prior to Visit  Medication Sig Dispense Refill   atorvastatin (LIPITOR) 10 MG tablet Take 10 mg by mouth daily.     Blood Glucose Monitoring Suppl (ACCU-CHEK GUIDE) w/Device KIT 1 each by Other route 4 (four) times daily -  before meals and at bedtime. 1 kit 0   gabapentin (NEURONTIN) 300 MG capsule Take 1 capsule (300 mg total) by mouth 3 (three) times daily. 90 capsule 2   glipiZIDE (GLUCOTROL XL) 10 MG 24 hr tablet Take 10 mg by mouth daily.     glucose blood (ACCU-CHEK GUIDE) test strip Use as instructed 100 each 12   insulin detemir (LEVEMIR) 100 UNIT/ML injection Inject 0.1 mLs (10 Units total) into the skin at bedtime. 10 mL 0   Insulin Pen Needle (PEN NEEDLES) 31G X 8 MM MISC UAD 100 each 0   Lancets (ACCU-CHEK MULTICLIX) lancets Use as instructed 100 each 12   lisinopril (ZESTRIL) 10 MG tablet Take 1 tablet (10 mg total) by mouth daily. 30 tablet 2   metFORMIN (GLUCOPHAGE-XR) 500 MG 24 hr tablet Take 1 tablet (500 mg total) by mouth 2 (two) times daily with a meal. 60 tablet 0   No current facility-administered medications on file prior to visit.    No Known Allergies  Social History   Socioeconomic History   Marital status: Legally Separated    Spouse name: Not on file   Number of children:  Not on file   Years of education: Not on file   Highest education level: Not on file  Occupational History   Not on file  Tobacco Use   Smoking status: Every Day    Packs/day: 2.00    Years: 12.00    Total pack years: 24.00    Types: Cigarettes    Passive exposure: Current   Smokeless tobacco: Never  Vaping Use   Vaping Use: Never used  Substance and Sexual Activity   Alcohol use: No   Drug use: No   Sexual activity: Yes    Birth  control/protection: None  Other Topics Concern   Not on file  Social History Narrative   Not on file   Social Determinants of Health   Financial Resource Strain: Not on file  Food Insecurity: Not on file  Transportation Needs: Not on file  Physical Activity: Not on file  Stress: Not on file  Social Connections: Not on file  Intimate Partner Violence: Not on file    Family History  Problem Relation Age of Onset   Diabetes Maternal Grandmother    Diabetes Paternal Grandmother     Past Surgical History:  Procedure Laterality Date   CESAREAN SECTION     CESAREAN SECTION WITH BILATERAL TUBAL LIGATION Bilateral 06/01/2014   Procedure: CESAREAN SECTION WITH BILATERAL TUBAL LIGATION;  Surgeon: Sherian Rein, MD;  Location: [facility];  Service: Obstetrics;  Laterality: Bilateral;  MD requests RNFA   CHEST TUBE INSERTION     TONSILLECTOMY     TRACHEOSTOMY      ROS: Review of Systems Negative except as stated above  PHYSICAL EXAM: BP 117/74 (BP Location: Left Arm, Patient Position: Sitting, Cuff Size: Normal)   Pulse 80   Temp 98.3 F (36.8 C)   Resp 16   Ht 5' 2.21" (1.58 m)   Wt 176 lb (79.8 kg)   SpO2 94%   BMI 31.98 kg/m   Physical Exam HENT:     Head: Normocephalic and atraumatic.  Eyes:     Extraocular Movements: Extraocular movements intact.     Conjunctiva/sclera: Conjunctivae normal.     Pupils: Pupils are equal, round, and reactive to light.  Cardiovascular:     Rate and Rhythm: Normal rate and regular rhythm.     Pulses: Normal pulses.     Heart sounds: Normal heart sounds.  Pulmonary:     Effort: Pulmonary effort is normal.     Breath sounds: Normal breath sounds.  Musculoskeletal:     Cervical back: Normal range of motion and neck supple.  Neurological:     General: No focal deficit present.     Mental Status: She is alert and oriented to person, place, and time.  Psychiatric:        Mood and Affect: Mood normal.        Behavior: Behavior  normal.     ASSESSMENT AND PLAN: 1. Type 2 diabetes mellitus with diabetic mononeuropathy, with long-term current use of insulin (HCC) - Increase Metformin XR from 500 mg twice daily to 1000 mg twice daily. No refills needed as of present. Counseled to take two 500 mg tablets twice daily to equal increased dosage. - Continue Insulin Determir as prescribed. Discussed with CMA patient's financial concern. CMA will call to the pharmacy to get more details on what may be covered under patient's health insurance plan of reasonable cost.  - Follow-up with clinical pharmacist in 4 weeks for diabetes checkup. Write your home blood sugar  results down each day and bring those results to your appointment along with your home glucose monitor. Medications may be revised at that time if needed. Check hemoglobin A1c during appointment and forward results to me.  - POCT glycosylated hemoglobin (Hb A1C); Future  2. Essential (primary) hypertension - Continue Lisinopril as prescribed. No refills needed as of present.  - Follow-up with clinical pharmacist in 4 weeks for blood pressure check. Write down your blood pressure readings each day and bring those results along with your home blood pressure monitor to your appointment. Medications may be adjusted at that time if needed.  3. Migraine without aura and without status migrainosus, not intractable - Sumatriptan as prescribed. Counseled on medication adherence and adverse effects. - Follow-up in 4 weeks or sooner if needed.  - SUMAtriptan (IMITREX) 25 MG tablet; Take 25 mg (1 tablet total) by mouth at the start of the headache. May repeat in 2 hours x 1 if headache persists. Max of 2 tablets/24 hours.  Dispense: 30 tablet; Refill: 1 - ketorolac (TORADOL) injection 60 mg  4. Encounter for smoking cessation counseling - Nicotine polacrilex and nicotine patch as prescribed. Counseled on medication adherence and adverse effects.  - Follow-up in 4 weeks or sooner  if needed.  - nicotine polacrilex (NICORETTE) 2 MG gum; Chew 1 piece of gum every 1 to 2 hours (maximum: 24 pieces/day).  Dispense: 100 tablet; Refill: 0 - nicotine (NICODERM CQ - DOSED IN MG/24 HOURS) 14 mg/24hr patch; Place 1 patch (14 mg total) onto the skin daily.  Dispense: 28 patch; Refill: 2  5. Anxiety and depression 6. History of bipolar disorder - Patient denies thoughts of self-harm, suicidal ideations, homicidal ideations. - Referral to Psychiatry for further evaluation and management.  - Ambulatory referral to Psychiatry  Patient was given the opportunity to ask questions.  Patient verbalized understanding of the plan and was able to repeat key elements of the plan. Patient was given clear instructions to go to Emergency Department or return to medical center if symptoms don't improve, worsen, or new problems develop.The patient verbalized understanding.   Orders Placed This Encounter  Procedures   Ambulatory referral to Psychiatry     Requested Prescriptions   Signed Prescriptions Disp Refills   SUMAtriptan (IMITREX) 25 MG tablet 30 tablet 1    Sig: Take 25 mg (1 tablet total) by mouth at the start of the headache. May repeat in 2 hours x 1 if headache persists. Max of 2 tablets/24 hours.   nicotine polacrilex (NICORETTE) 2 MG gum 100 tablet 0    Sig: Chew 1 piece of gum every 1 to 2 hours (maximum: 24 pieces/day).   nicotine (NICODERM CQ - DOSED IN MG/24 HOURS) 14 mg/24hr patch 28 patch 2    Sig: Place 1 patch (14 mg total) onto the skin daily.    Return in about 4 weeks (around 09/18/2021) for Follow-Up or next available diabetes, blood pressure with Franky Macho RPH-CPP at [facility].  Rema Fendt, NP

## 2021-08-21 ENCOUNTER — Encounter: Payer: Self-pay | Admitting: Family

## 2021-08-21 ENCOUNTER — Ambulatory Visit (INDEPENDENT_AMBULATORY_CARE_PROVIDER_SITE_OTHER): Payer: No Typology Code available for payment source | Admitting: Family

## 2021-08-21 VITALS — BP 117/74 | HR 80 | Temp 98.3°F | Resp 16 | Ht 62.21 in | Wt 176.0 lb

## 2021-08-21 DIAGNOSIS — F419 Anxiety disorder, unspecified: Secondary | ICD-10-CM

## 2021-08-21 DIAGNOSIS — Z716 Tobacco abuse counseling: Secondary | ICD-10-CM | POA: Diagnosis not present

## 2021-08-21 DIAGNOSIS — I10 Essential (primary) hypertension: Secondary | ICD-10-CM | POA: Diagnosis not present

## 2021-08-21 DIAGNOSIS — E1141 Type 2 diabetes mellitus with diabetic mononeuropathy: Secondary | ICD-10-CM

## 2021-08-21 DIAGNOSIS — F32A Depression, unspecified: Secondary | ICD-10-CM

## 2021-08-21 DIAGNOSIS — Z8659 Personal history of other mental and behavioral disorders: Secondary | ICD-10-CM

## 2021-08-21 DIAGNOSIS — G43009 Migraine without aura, not intractable, without status migrainosus: Secondary | ICD-10-CM

## 2021-08-21 DIAGNOSIS — Z794 Long term (current) use of insulin: Secondary | ICD-10-CM

## 2021-08-21 MED ORDER — NICOTINE 14 MG/24HR TD PT24: 14.0000 mg | MEDICATED_PATCH | Freq: Every day | TRANSDERMAL | 2 refills | Status: DC

## 2021-08-21 MED ORDER — KETOROLAC TROMETHAMINE 60 MG/2ML IM SOLN
60.0000 mg | Freq: Once | INTRAMUSCULAR | Status: AC
  Administered 2021-08-21: 60 mg via INTRAMUSCULAR

## 2021-08-21 MED ORDER — SUMATRIPTAN SUCCINATE 25 MG PO TABS: ORAL_TABLET | ORAL | 1 refills | Status: DC

## 2021-08-21 MED ORDER — NICOTINE POLACRILEX 2 MG MT GUM: CHEWING_GUM | OROMUCOSAL | 0 refills | Status: DC

## 2021-08-21 NOTE — Progress Notes (Signed)
Pt presents for chronic care management, pt request alternative to Levemir due to cost of $65 is not affordable

## 2021-08-21 NOTE — Patient Instructions (Signed)
Type 2 Diabetes Mellitus, Self-Care, Adult Caring for yourself after you have been diagnosed with type 2 diabetes (type 2 diabetes mellitus) means keeping your blood sugar (glucose) under control with a balance of: Nutrition. Exercise. Lifestyle changes. Medicines or insulin, if needed. Support from your team of health care providers and others. What are the risks? Having type 2 diabetes can put you at risk for other long-term (chronic) conditions, such as heart disease and kidney disease. Your health care provider may prescribe medicines to help prevent complications from diabetes. How to monitor your blood glucose  Check your blood glucose every day or as often as told by your health care provider. Have your A1C (hemoglobin A1C) level checked two or more times a year, or as often as told by your health care provider. Your health care provider will set personalized treatment goals for you. Generally, the goal of treatment is to maintain the following blood glucose levels: Before meals: 80-130 mg/dL (4.4-7.2 mmol/L). After meals: below 180 mg/dL (10 mmol/L). A1C level: less than 7%. How to manage hyperglycemia and hypoglycemia Hyperglycemia symptoms Hyperglycemia, also called high blood glucose, occurs when blood glucose is too high. Make sure you know the early signs of hyperglycemia, such as: Increased thirst. Hunger. Feeling very tired. Needing to urinate more often than usual. Blurry vision. Hypoglycemia symptoms Hypoglycemia, also called low blood glucose, occurs with a blood glucose level at or below 70 mg/dL (3.9 mmol/L). Diabetes medicines lower your blood glucose and can cause hypoglycemia. The risk for hypoglycemia increases during or after exercise, during sleep, during illness, and when skipping meals or not eating for a long time (fasting). It is important to know the symptoms of hypoglycemia and treat it right away. Always have a 15-gram rapid-acting carbohydrate snack with  you to treat low blood glucose. Family members and close friends should also know the symptoms and understand how to treat hypoglycemia, in case you are not able to treat yourself. Symptoms may include: Hunger. Anxiety. Sweating and feeling clammy. Dizziness or feeling light-headed. Sleepiness. Increased heart rate. Irritability. Tingling or numbness around the mouth, lips, or tongue. Restless sleep. Severe hypoglycemia is when your blood glucose level is at or below 54 mg/dL (3 mmol/L). Severe hypoglycemia is an emergency. Do not wait to see if the symptoms will go away. Get medical help right away. Call your local emergency services (911 in the [place] Do not drive yourself to the hospital. If you have severe hypoglycemia and you cannot eat or drink, you may need glucagon. A family member or close friend should learn how to check your blood glucose and how to give you glucagon. Ask your health care provider if you need to have an emergency glucagon kit available. Follow these instructions at home: Medicines Take prescribed insulin or diabetes medicines as told by your health care provider. Do not run out of insulin or other diabetes medicines. Plan ahead so you always have these available. If you use insulin, adjust your dosage based on your physical activity and what foods you eat. Your health care provider will tell you how to adjust your dosage. Take over-the-counter and prescription medicines only as told by your health care provider. Eating and drinking  What you eat and drink affects your blood glucose and your insulin dosage. Making good choices helps to control your diabetes and prevent other health problems. A healthy meal plan includes eating lean proteins, complex carbohydrates, fresh fruits and vegetables, low-fat dairy products, and healthy fats. Make an appointment   to see a registered dietitian to help you create an eating plan that is right for you. Make sure that you: Follow  instructions from your health care provider about eating or drinking restrictions. Drink enough fluid to keep your urine pale yellow. Keep a record of the carbohydrates that you eat. Do this by reading food labels and learning the standard serving sizes of foods. Follow your sick-day plan whenever you cannot eat or drink as usual. Make this plan in advance with your health care provider.  Activity Stay active. Exercise regularly, as told by your health care provider. This may include: Stretching and doing strength exercises, such as yoga or weight lifting, two or more times a week. Doing 150 minutes or more of moderate-intensity or vigorous-intensity exercise each week. This could be brisk walking, biking, or water aerobics. Spread out your activity over 3 or more days of the week. Do not go more than 2 days in a row without doing some kind of physical activity. When you start a new exercise or activity, work with your health care provider to adjust your insulin, medicines, or food intake as needed. Lifestyle Do not use any products that contain nicotine or tobacco. These products include cigarettes, chewing tobacco, and vaping devices, such as e-cigarettes. If you need help quitting, ask your health care provider. If you drink alcohol and your health care provider says that it is safe for you: Limit how much you have to: 0-1 drink a day for women who are not pregnant. 0-2 drinks a day for men. Know how much alcohol is in your drink. In the [place] one drink equals one 12 oz bottle of beer (355 mL), one 5 oz glass of wine (148 mL), or one 1 oz glass of hard liquor (44 mL). Learn to manage stress. If you need help with this, ask your health care provider. Take care of your body  Keep your immunizations up to date. In addition to getting vaccinations as told by your health care provider, it is recommended that you get vaccinated against the following illnesses: The flu (influenza). Get a flu shot  every year. Pneumonia. Hepatitis B. Schedule an eye exam soon after your diagnosis, and then one time every year after that. Check your skin and feet every day for cuts, bruises, redness, blisters, or sores. Schedule a foot exam with your health care provider once every year. Brush your teeth and gums two times a day, and floss one or more times a day. Visit your dentist one or more times every 6 months. Maintain a healthy weight. General instructions Share your diabetes management plan with people in your workplace, school, and household. Carry a medical alert card or wear medical alert jewelry. Keep all follow-up visits. This is important. Questions to ask your health care provider Should I meet with a certified diabetes care and education specialist? Where can I find a support group for people with diabetes? Where to find more information For help and guidance and for more information about diabetes, please visit: American Diabetes Association (ADA): www.diabetes.org American Association of Diabetes Care and Education Specialists (ADCES): www.diabeteseducator.org International Diabetes Federation (IDF): www.idf.org Summary Caring for yourself after you have been diagnosed with type 2 diabetes (type 2 diabetes mellitus) means keeping your blood sugar (glucose) under control with a balance of nutrition, exercise, lifestyle changes, and medicine. Check your blood glucose every day, as often as told by your health care provider. Having diabetes can put you at risk for other long-term (  chronic) conditions, such as heart disease and kidney disease. Your health care provider may prescribe medicines to help prevent complications from diabetes. Share your diabetes management plan with people in your workplace, school, and household. Keep all follow-up visits. This is important. This information is not intended to replace advice given to you by your health care provider. Make sure you discuss any  questions you have with your health care provider. Document Revised: 05/29/2020 Document Reviewed: 05/29/2020 Elsevier Patient Education  2023 Elsevier Inc.  

## 2021-08-26 ENCOUNTER — Other Ambulatory Visit: Payer: Self-pay | Admitting: Family

## 2021-08-26 DIAGNOSIS — E1141 Type 2 diabetes mellitus with diabetic mononeuropathy: Secondary | ICD-10-CM

## 2021-08-26 NOTE — Telephone Encounter (Signed)
Requested Prescriptions  Pending Prescriptions Disp Refills  . metFORMIN (GLUCOPHAGE-XR) 500 MG 24 hr tablet [Pharmacy Med Name: metFORMIN HCl ER 500 MG Oral Tablet Extended Release 24 Hour] 60 tablet 0    Sig: TAKE 1 TABLET BY MOUTH TWICE DAILY WITH A MEAL     Endocrinology:  Diabetes - Biguanides Failed - 08/26/2021  9:22 AM      Failed - HBA1C is between 0 and 7.9 and within 180 days    Hemoglobin A1C  Date Value Ref Range Status  08/06/2021 11.0 (A) 4.0 - 5.6 % Final   Hgb A1c MFr Bld  Date Value Ref Range Status  07/11/2018 8.1 (H) 4.8 - 5.6 % Final    Comment:    (NOTE) Pre diabetes:          5.7%-6.4% Diabetes:              >6.4% Glycemic control for   <7.0% adults with diabetes          Failed - B12 Level in normal range and within 720 days    No results found for: "VITAMINB12"       Failed - CBC within normal limits and completed in the last 12 months    WBC  Date Value Ref Range Status  07/11/2018 12.9 (H) 4.0 - 10.5 K/uL Final   RBC  Date Value Ref Range Status  07/11/2018 5.04 3.87 - 5.11 MIL/uL Final   Hemoglobin  Date Value Ref Range Status  10/18/2018 12.6 12.0 - 15.0 g/dL Final   HCT  Date Value Ref Range Status  10/18/2018 37.0 36.0 - 46.0 % Final   MCHC  Date Value Ref Range Status  07/11/2018 32.5 30.0 - 36.0 g/dL Final   [facility]  Date Value Ref Range Status  07/11/2018 28.4 26.0 - 34.0 pg Final   MCV  Date Value Ref Range Status  07/11/2018 87.3 80.0 - 100.0 fL Final   No results found for: "PLTCOUNTKUC", "LABPLAT", "POCPLA" RDW  Date Value Ref Range Status  07/11/2018 13.7 11.5 - 15.5 % Final         Passed - Cr in normal range and within 360 days    Creatinine, Ser  Date Value Ref Range Status  08/06/2021 0.59 0.57 - 1.00 mg/dL Final   Creatinine, Urine  Date Value Ref Range Status  05/28/2014 56.00 mg/dL Final         Passed - eGFR in normal range and within 360 days    GFR calc Af Amer  Date Value Ref Range Status   07/11/2018 >60 >60 mL/min Final   GFR calc non Af Amer  Date Value Ref Range Status  07/11/2018 >60 >60 mL/min Final   eGFR  Date Value Ref Range Status  08/06/2021 115 >59 mL/min/1.73 Final         Passed - Valid encounter within last 6 months    Recent Outpatient Visits          5 days ago Type 2 diabetes mellitus with diabetic mononeuropathy, with long-term current use of insulin [facility])   Primary Care at [facility], [place], NP   2 weeks ago Encounter to establish care   Primary Care at [facility], [place], NP      Future Appointments            In 2 weeks Tresa Endo, [place]   In 3 weeks Camillia Herter, NP  Primary Care at [facility]

## 2021-09-09 NOTE — Progress Notes (Deleted)
Patient ID: [name] [name], female    DOB: October 07, 1980  MRN: [id number]  CC: Chronic Care Management   Subjective: [name] [name] is a 41 y.o. female who presents for chronic care management.   Her concerns today include:   DM -  Increase Metformin XR from 500 mg twice daily to 1000 mg twice daily.  Continue Insulin Determir   HTN  Continue Lisinopril   Migraine  Sumatriptan   Smoking Cessation Nicotine gum and patch   Heard from Psych referral?    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   Hypertensive retinopathy of both eyes 11/08/2018   Nuclear age-related cataract, both eyes 11/08/2018   MDD (major depressive disorder), recurrent episode, moderate (HCC) 08/04/2018   PTSD (post-traumatic stress disorder) 08/04/2018   MDD (major depressive disorder), severe (HCC) 07/12/2018   GERD (gastroesophageal reflux disease) 02/17/2018   Type 2 diabetes mellitus without complication, without long-term current use of insulin (HCC) 02/17/2018   S/P cesarean section 06/01/2014   S/P tubal ligation 06/01/2014   H/O cesarean section complicating pregnancy 04/18/2014   Emotional disorder 03/16/2013   Tobacco user 03/16/2013   Acute chest pain 03/01/2013   HTN (hypertension) 09/28/2012   DM (diabetes mellitus) (HCC) 11/06/2011     Current Outpatient Medications on File Prior to Visit  Medication Sig Dispense Refill   atorvastatin (LIPITOR) 10 MG tablet Take 10 mg by mouth daily.     Blood Glucose Monitoring Suppl (ACCU-CHEK GUIDE) w/Device KIT 1 each by Other route 4 (four) times daily -  before meals and at bedtime. 1 kit 0   gabapentin (NEURONTIN) 300 MG capsule Take 1 capsule (300 mg total) by mouth 3 (three) times daily. 90 capsule 2   glipiZIDE (GLUCOTROL XL) 10 MG 24 hr tablet Take 10 mg by mouth daily.     glucose blood (ACCU-CHEK GUIDE) test strip Use as instructed 100 each 12   insulin detemir (LEVEMIR) 100 UNIT/ML injection  Inject 0.1 mLs (10 Units total) into the skin at bedtime. 10 mL 0   Insulin Pen Needle (PEN NEEDLES) 31G X 8 MM MISC UAD 100 each 0   Lancets (ACCU-CHEK MULTICLIX) lancets Use as instructed 100 each 12   lisinopril (ZESTRIL) 10 MG tablet Take 1 tablet (10 mg total) by mouth daily. 30 tablet 2   metFORMIN (GLUCOPHAGE-XR) 500 MG 24 hr tablet TAKE 1 TABLET BY MOUTH TWICE DAILY WITH A MEAL 180 tablet 0   nicotine (NICODERM CQ - DOSED IN MG/24 HOURS) 14 mg/24hr patch Place 1 patch (14 mg total) onto the skin daily. 28 patch 2   nicotine polacrilex (NICORETTE) 2 MG gum Chew 1 piece of gum every 1 to 2 hours (maximum: 24 pieces/day). 100 tablet 0   SUMAtriptan (IMITREX) 25 MG tablet Take 25 mg (1 tablet total) by mouth at the start of the headache. May repeat in 2 hours x 1 if headache persists. Max of 2 tablets/24 hours. 30 tablet 1   No current facility-administered medications on file prior to visit.    No Known Allergies  Social History   Socioeconomic History   Marital status: Legally Separated    Spouse name: Not on file   Number of children: Not on file   Years of education: Not on file   Highest education level: Not on file  Occupational History   Not on file  Tobacco Use   Smoking status: Every Day    Packs/day: 2.00  Years: 12.00    Total pack years: 24.00    Types: Cigarettes    Passive exposure: Current   Smokeless tobacco: Never  Vaping Use   Vaping Use: Never used  Substance and Sexual Activity   Alcohol use: No   Drug use: No   Sexual activity: Yes    Birth control/protection: None  Other Topics Concern   Not on file  Social History Narrative   Not on file   Social Determinants of Health   Financial Resource Strain: Not on file  Food Insecurity: Not on file  Transportation Needs: Not on file  Physical Activity: Not on file  Stress: Not on file  Social Connections: Not on file  Intimate Partner Violence: Not on file    Family History  Problem Relation  Age of Onset   Diabetes Maternal Grandmother    Diabetes Paternal Grandmother     Past Surgical History:  Procedure Laterality Date   CESAREAN SECTION     CESAREAN SECTION WITH BILATERAL TUBAL LIGATION Bilateral 06/01/2014   Procedure: CESAREAN SECTION WITH BILATERAL TUBAL LIGATION;  Surgeon: Sherian Rein, MD;  Location: [facility];  Service: Obstetrics;  Laterality: Bilateral;  MD requests RNFA   CHEST TUBE INSERTION     TONSILLECTOMY     TRACHEOSTOMY      ROS: Review of Systems Negative except as stated above  PHYSICAL EXAM: There were no vitals taken for this visit.  Physical Exam  {female adult master:310786} {female adult master:310785}     Latest Ref Rng & Units 08/06/2021    4:13 PM 10/18/2018    8:24 PM 07/11/2018    8:16 PM  CMP  Glucose 70 - 99 mg/dL 102  725  366   BUN 6 - 24 mg/dL 11  12  12    Creatinine 0.57 - 1.00 mg/dL 4.40  3.47  4.25   Sodium 134 - 144 mmol/L 137  140  138   Potassium 3.5 - 5.2 mmol/L 4.6  4.0  3.9   Chloride 96 - 106 mmol/L 100  105  105   CO2 20 - 29 mmol/L 21   22   Calcium 8.7 - 10.2 mg/dL 9.4   9.1   Total Protein 6.0 - 8.5 g/dL 6.4   7.4   Total Bilirubin 0.0 - 1.2 mg/dL <9.5   0.2   Alkaline Phos 44 - 121 IU/L 81   62   AST 0 - 40 IU/L 9   15   ALT 0 - 32 IU/L 9   17    Lipid Panel     Component Value Date/Time   CHOL 172 07/12/2018 1115   TRIG 251 (H) 07/12/2018 1115   HDL 31 (L) 07/12/2018 1115   CHOLHDL 5.5 07/12/2018 1115   VLDL 50 (H) 07/12/2018 1115   LDLCALC 91 07/12/2018 1115    CBC    Component Value Date/Time   WBC 12.9 (H) 07/11/2018 2016   RBC 5.04 07/11/2018 2016   HGB 12.6 10/18/2018 2024   HCT 37.0 10/18/2018 2024   PLT 306 07/11/2018 2016   MCV 87.3 07/11/2018 2016   MCH 28.4 07/11/2018 2016   MCHC 32.5 07/11/2018 2016   RDW 13.7 07/11/2018 2016   LYMPHSABS 2.7 07/11/2018 2016   MONOABS 0.5 07/11/2018 2016   EOSABS 0.0 07/11/2018 2016   BASOSABS 0.0 07/11/2018 2016    ASSESSMENT AND  PLAN:  There are no diagnoses linked to this encounter.   Patient was given the opportunity to  ask questions.  Patient verbalized understanding of the plan and was able to repeat key elements of the plan. Patient was given clear instructions to go to Emergency Department or return to medical center if symptoms don't improve, worsen, or new problems develop.The patient verbalized understanding.   No orders of the defined types were placed in this encounter.    Requested Prescriptions    No prescriptions requested or ordered in this encounter    No follow-ups on file.  Rema Fendt, NP

## 2021-09-11 ENCOUNTER — Ambulatory Visit: Payer: No Typology Code available for payment source | Admitting: Pharmacist

## 2021-09-16 ENCOUNTER — Ambulatory Visit: Payer: Self-pay | Admitting: *Deleted

## 2021-09-16 ENCOUNTER — Telehealth: Payer: No Typology Code available for payment source | Admitting: Physician Assistant

## 2021-09-16 DIAGNOSIS — U071 COVID-19: Secondary | ICD-10-CM

## 2021-09-16 MED ORDER — ALBUTEROL SULFATE HFA 108 (90 BASE) MCG/ACT IN AERS: 2.0000 | INHALATION_SPRAY | Freq: Four times a day (QID) | RESPIRATORY_TRACT | 0 refills | Status: DC | PRN

## 2021-09-16 MED ORDER — BENZONATATE 100 MG PO CAPS: 100.0000 mg | ORAL_CAPSULE | Freq: Three times a day (TID) | ORAL | 0 refills | Status: DC | PRN

## 2021-09-16 MED ORDER — NIRMATRELVIR/RITONAVIR (PAXLOVID)TABLET: 3.0000 | ORAL_TABLET | Freq: Two times a day (BID) | ORAL | 0 refills | Status: AC

## 2021-09-16 NOTE — Telephone Encounter (Signed)
FYI

## 2021-09-16 NOTE — Patient Instructions (Signed)
[name] [name], thank you for joining Leeanne Rio, PA-C for today's virtual visit.  While this provider is not your primary care provider (PCP), if your PCP is located in our provider database this encounter information will be shared with them immediately following your visit.  Consent: (Patient) [name] [name] provided verbal consent for this virtual visit at the beginning of the encounter.  Current Medications:  Current Outpatient Medications:    atorvastatin (LIPITOR) 10 MG tablet, Take 10 mg by mouth daily., Disp: , Rfl:    Blood Glucose Monitoring Suppl (ACCU-CHEK GUIDE) w/Device KIT, 1 each by Other route 4 (four) times daily -  before meals and at bedtime., Disp: 1 kit, Rfl: 0   gabapentin (NEURONTIN) 300 MG capsule, Take 1 capsule (300 mg total) by mouth 3 (three) times daily., Disp: 90 capsule, Rfl: 2   glipiZIDE (GLUCOTROL XL) 10 MG 24 hr tablet, Take 10 mg by mouth daily., Disp: , Rfl:    glucose blood (ACCU-CHEK GUIDE) test strip, Use as instructed, Disp: 100 each, Rfl: 12   insulin detemir (LEVEMIR) 100 UNIT/ML injection, Inject 0.1 mLs (10 Units total) into the skin at bedtime., Disp: 10 mL, Rfl: 0   Insulin Pen Needle (PEN NEEDLES) 31G X 8 MM MISC, UAD, Disp: 100 each, Rfl: 0   Lancets (ACCU-CHEK MULTICLIX) lancets, Use as instructed, Disp: 100 each, Rfl: 12   lisinopril (ZESTRIL) 10 MG tablet, Take 1 tablet (10 mg total) by mouth daily., Disp: 30 tablet, Rfl: 2   metFORMIN (GLUCOPHAGE-XR) 500 MG 24 hr tablet, TAKE 1 TABLET BY MOUTH TWICE DAILY WITH A MEAL, Disp: 180 tablet, Rfl: 0   nicotine (NICODERM CQ - DOSED IN MG/24 HOURS) 14 mg/24hr patch, Place 1 patch (14 mg total) onto the skin daily., Disp: 28 patch, Rfl: 2   nicotine polacrilex (NICORETTE) 2 MG gum, Chew 1 piece of gum every 1 to 2 hours (maximum: 24 pieces/day)., Disp: 100 tablet, Rfl: 0   SUMAtriptan (IMITREX) 25 MG tablet, Take 25 mg (1 tablet total) by mouth at the start of the headache. May repeat in 2  hours x 1 if headache persists. Max of 2 tablets/24 hours., Disp: 30 tablet, Rfl: 1   Medications ordered in this encounter:  No orders of the defined types were placed in this encounter.    *If you need refills on other medications prior to your next appointment, please contact your pharmacy*  Follow-Up: Call back or seek an in-person evaluation if the symptoms worsen or if the condition fails to improve as anticipated.  Other Instructions Please keep well-hydrated and get plenty of rest. Start a saline nasal rinse to flush out your nasal passages. You can use plain Mucinex to help thin congestion. If you have a humidifier, running in the bedroom at night. I want you to start OTC vitamin D3 1000 units daily, vitamin C 1000 mg daily, and a zinc supplement. Please take prescribed medications as directed.  You have been enrolled in a MyChart symptom monitoring program. Please answer these questions daily so we can keep track of how you are doing.  You were to quarantine for 5 days from onset of your symptoms.  After day 5, if you have had no fever and you are feeling better, you can end quarantine but need to mask for an additional 5 days. After day 5 if you have a fever or are having significant symptoms, please quarantine for full 10 days.  If you note any worsening of  symptoms, any significant shortness of breath or any chest pain, please seek ER evaluation ASAP.  Please do not delay care!  COVID-19: What to Do if You Are Sick If you test positive and are an older adult or someone who is at high risk of getting very sick from COVID-19, treatment may be available. Contact a healthcare provider right away after a positive test to determine if you are eligible, even if your symptoms are mild right now. You can also visit a Test to Treat location and, if eligible, receive a prescription from a provider. Don't delay: Treatment must be started within the first few days to be effective. If you  have a fever, cough, or other symptoms, you might have COVID-19. Most people have mild illness and are able to recover at home. If you are sick: Keep track of your symptoms. If you have an emergency warning sign (including trouble breathing), call 911. Steps to help prevent the spread of COVID-19 if you are sick If you are sick with COVID-19 or think you might have COVID-19, follow the steps below to care for yourself and to help protect other people in your home and community. Stay home except to get medical care Stay home. Most people with COVID-19 have mild illness and can recover at home without medical care. Do not leave your home, except to get medical care. Do not visit public areas and do not go to places where you are unable to wear a mask. Take care of yourself. Get rest and stay hydrated. Take over-the-counter medicines, such as acetaminophen, to help you feel better. Stay in touch with your doctor. Call before you get medical care. Be sure to get care if you have trouble breathing, or have any other emergency warning signs, or if you think it is an emergency. Avoid public transportation, ride-sharing, or taxis if possible. Get tested If you have symptoms of COVID-19, get tested. While waiting for test results, stay away from others, including staying apart from those living in your household. Get tested as soon as possible after your symptoms start. Treatments may be available for people with COVID-19 who are at risk for becoming very sick. Don't delay: Treatment must be started early to be effective--some treatments must begin within 5 days of your first symptoms. Contact your healthcare provider right away if your test result is positive to determine if you are eligible. Self-tests are one of several options for testing for the virus that causes COVID-19 and may be more convenient than laboratory-based tests and point-of-care tests. Ask your healthcare provider or your local health  department if you need help interpreting your test results. You can visit your state, tribal, local, and territorial health department's website to look for the latest local information on testing sites. Separate yourself from other people As much as possible, stay in a specific room and away from other people and pets in your home. If possible, you should use a separate bathroom. If you need to be around other people or animals in or outside of the home, wear a well-fitting mask. Tell your close contacts that they may have been exposed to COVID-19. An infected person can spread COVID-19 starting 48 hours (or 2 days) before the person has any symptoms or tests positive. By letting your close contacts know they may have been exposed to COVID-19, you are helping to protect everyone. See COVID-19 and Animals if you have questions about pets. If you are diagnosed with COVID-19, someone from the  health department may call you. Answer the call to slow the spread. Monitor your symptoms Symptoms of COVID-19 include fever, cough, or other symptoms. Follow care instructions from your healthcare provider and local health department. Your local health authorities may give instructions on checking your symptoms and reporting information. When to seek emergency medical attention Look for emergency warning signs* for COVID-19. If someone is showing any of these signs, seek emergency medical care immediately: Trouble breathing Persistent pain or pressure in the chest New confusion Inability to wake or stay awake Pale, gray, or blue-colored skin, lips, or nail beds, depending on skin tone *This list is not all possible symptoms. Please call your medical provider for any other symptoms that are severe or concerning to you. Call 911 or call ahead to your local emergency facility: Notify the operator that you are seeking care for someone who has or may have COVID-19. Call ahead before visiting your doctor Call  ahead. Many medical visits for routine care are being postponed or done by phone or telemedicine. If you have a medical appointment that cannot be postponed, call your doctor's office, and tell them you have or may have COVID-19. This will help the office protect themselves and other patients. If you are sick, wear a well-fitting mask You should wear a mask if you must be around other people or animals, including pets (even at home). Wear a mask with the best fit, protection, and comfort for you. You don't need to wear the mask if you are alone. If you can't put on a mask (because of trouble breathing, for example), cover your coughs and sneezes in some other way. Try to stay at least 6 feet away from other people. This will help protect the people around you. Masks should not be placed on young children under age 93 years, anyone who has trouble breathing, or anyone who is not able to remove the mask without help. Cover your coughs and sneezes Cover your mouth and nose with a tissue when you cough or sneeze. Throw away used tissues in a lined trash can. Immediately wash your hands with soap and water for at least 20 seconds. If soap and water are not available, clean your hands with an alcohol-based hand sanitizer that contains at least 60% alcohol. Clean your hands often Wash your hands often with soap and water for at least 20 seconds. This is especially important after blowing your nose, coughing, or sneezing; going to the bathroom; and before eating or preparing food. Use hand sanitizer if soap and water are not available. Use an alcohol-based hand sanitizer with at least 60% alcohol, covering all surfaces of your hands and rubbing them together until they feel dry. Soap and water are the best option, especially if hands are visibly dirty. Avoid touching your eyes, nose, and mouth with unwashed hands. Handwashing Tips Avoid sharing personal household items Do not share dishes, drinking glasses,  cups, eating utensils, towels, or bedding with other people in your home. Wash these items thoroughly after using them with soap and water or put in the dishwasher. Clean surfaces in your home regularly Clean and disinfect high-touch surfaces (for example, doorknobs, tables, handles, light switches, and countertops) in your "sick room" and bathroom. In shared spaces, you should clean and disinfect surfaces and items after each use by the person who is ill. If you are sick and cannot clean, a caregiver or other person should only clean and disinfect the area around you (such as your bedroom  and bathroom) on an as needed basis. Your caregiver/other person should wait as long as possible (at least several hours) and wear a mask before entering, cleaning, and disinfecting shared spaces that you use. Clean and disinfect areas that may have blood, stool, or body fluids on them. Use household cleaners and disinfectants. Clean visible dirty surfaces with household cleaners containing soap or detergent. Then, use a household disinfectant. Use a product from [employer] List N: Disinfectants for Coronavirus (DSKAJ-68). Be sure to follow the instructions on the label to ensure safe and effective use of the product. Many products recommend keeping the surface wet with a disinfectant for a certain period of time (look at "contact time" on the product label). You may also need to wear personal protective equipment, such as gloves, depending on the directions on the product label. Immediately after disinfecting, wash your hands with soap and water for 20 seconds. For completed guidance on cleaning and disinfecting your home, visit Complete Disinfection Guidance. Take steps to improve ventilation at home Improve ventilation (air flow) at home to help prevent from spreading COVID-19 to other people in your household. Clear out COVID-19 virus particles in the air by opening windows, using air filters, and turning on fans in  your home. Use this interactive tool to learn how to improve air flow in your home. When you can be around others after being sick with COVID-19 Deciding when you can be around others is different for different situations. Find out when you can safely end home isolation. For any additional questions about your care, contact your healthcare provider or state or local health department. 04/02/2020 Content source: [facility] Immunization and Respiratory Diseases ([facility]), Division of Viral Diseases This information is not intended to replace advice given to you by your health care provider. Make sure you discuss any questions you have with your health care provider. Document Revised: 05/16/2020 Document Reviewed: 05/16/2020 Elsevier Patient Education  2022 [employer].      If you have been instructed to have an in-person evaluation today at a local Urgent Care facility, please use the link below. It will take you to a list of all of our available [facility], including address, phone number and hours of operation. Please do not delay care.  [facility]  If you or a family member do not have a primary care provider, use the link below to schedule a visit and establish care. When you choose a [facility] primary care physician or advanced practice provider, you gain a long-term partner in health. Find a Primary Care Provider  Learn more about [facility] in-office and virtual care options: [facility]

## 2021-09-16 NOTE — Telephone Encounter (Signed)
Summary: COVID Advice   Pt is calling to report that she tested positive on a in home COVID test. Wanting advice if she can still work around [employer] around people? Sx include no taste, no smell, congestion, coughing, no temp, feels warm, body aches, with headache.      Reason for Disposition  [1] HIGH RISK patient (e.g., weak immune system, age > 64 years, obesity with BMI 30 or higher, pregnant, chronic lung disease or other chronic medical condition) AND [2] COVID symptoms (e.g., cough, fever)  (Exceptions: Already seen by PCP and no new or worsening symptoms.)  Answer Assessment - Initial Assessment Questions 1. COVID-19 DIAGNOSIS: "How do you know that you have COVID?" (e.g., positive lab test or self-test, diagnosed by doctor or NP/PA, symptoms after exposure).     + home test 2. COVID-19 EXPOSURE: "Was there any known exposure to COVID before the symptoms began?" CDC Definition of close contact: within 6 feet (2 meters) for a total of 15 minutes or more over a 24-hour period.      unknown 3. ONSET: "When did the COVID-19 symptoms start?"      Yesterday afternoon 4. WORST SYMPTOM: "What is your worst symptom?" (e.g., cough, fever, shortness of breath, muscle aches)     congestion 5. COUGH: "Do you have a cough?" If Yes, ask: "How bad is the cough?"       Yes- spells 6. FEVER: "Do you have a fever?" If Yes, ask: "What is your temperature, how was it measured, and when did it start?"     Not checked- feels like she has 7. RESPIRATORY STATUS: "Describe your breathing?" (e.g., normal; shortness of breath, wheezing, unable to speak)      Chest tightness, SOB with exertion 8. BETTER-SAME-WORSE: "Are you getting better, staying the same or getting worse compared to yesterday?"  If getting worse, ask, "In what way?"     Worse- more symptoms 9. OTHER SYMPTOMS: "Do you have any other symptoms?"  (e.g., chills, fatigue, headache, loss of smell or taste, muscle pain, sore throat)     Sore  throat, muscle pain 10. HIGH RISK DISEASE: "Do you have any chronic medical problems?" (e.g., asthma, heart or lung disease, weak immune system, obesity, etc.)       Diabetes, high BP 11. VACCINE: "Have you had the COVID-19 vaccine?" If Yes, ask: "Which one, how many shots, when did you get it?"       no 12. PREGNANCY: "Is there any chance you are pregnant?" "When was your last menstrual period?"       no 13. O2 SATURATION MONITOR:  "Do you use an oxygen saturation monitor (pulse oximeter) at home?" If Yes, ask "What is your reading (oxygen level) today?" "What is your usual oxygen saturation reading?" (e.g., 95%)  Protocols used: Coronavirus (COVID-19) Diagnosed or Suspected-A-AH

## 2021-09-16 NOTE — Progress Notes (Signed)
Virtual Visit Consent   [name] [name], you are scheduled for a virtual visit with a [facility] provider today. Just as with appointments in the office, your consent must be obtained to participate. Your consent will be active for this visit and any virtual visit you may have with one of our providers in the next 365 days. If you have a MyChart account, a copy of this consent can be sent to you electronically.  As this is a virtual visit, video technology does not allow for your provider to perform a traditional examination. This may limit your provider's ability to fully assess your condition. If your provider identifies any concerns that need to be evaluated in person or the need to arrange testing (such as labs, EKG, etc.), we will make arrangements to do so. Although advances in technology are sophisticated, we cannot ensure that it will always work on either your end or our end. If the connection with a video visit is poor, the visit may have to be switched to a telephone visit. With either a video or telephone visit, we are not always able to ensure that we have a secure connection.  By engaging in this virtual visit, you consent to the provision of healthcare and authorize for your insurance to be billed (if applicable) for the services provided during this visit. Depending on your insurance coverage, you may receive a charge related to this service.  I need to obtain your verbal consent now. Are you willing to proceed with your visit today? DYMOND [name] has provided verbal consent on 09/16/2021 for a virtual visit (video or telephone). [name] [name], [place]  Date: 09/16/2021 5:17 PM  Virtual Visit via Video Note   I, [name] [name], connected with  [name] [name]  ([id number], Jan 29, 1980) on 09/16/21 at  5:15 PM EDT by a video-enabled telemedicine application and verified that I am speaking with the correct person using two identifiers.  Location: Patient: Virtual Visit Location  Patient: Home Provider: Virtual Visit Location Provider: Home Office   I discussed the limitations of evaluation and management by telemedicine and the availability of in person appointments. The patient expressed understanding and agreed to proceed.    History of Present Illness: [name] [name] is a 41 y.o. who identifies as a female who was assigned female at birth, and is being seen today for COVID-19. Notes symptoms starting yesterday afternoon and progressing today with nasal and head congestion, chest congestion, cough. Loss taste and smell as of this afternoon with more significant headache. SOB with exertion only. Tested positive for COVID at home this afternoon.  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   Hypertensive retinopathy of both eyes 11/08/2018   Nuclear age-related cataract, both eyes 11/08/2018   MDD (major depressive disorder), recurrent episode, moderate (HCC) 08/04/2018   PTSD (post-traumatic stress disorder) 08/04/2018   MDD (major depressive disorder), severe (HCC) 07/12/2018   GERD (gastroesophageal reflux disease) 02/17/2018   Type 2 diabetes mellitus without complication, without long-term current use of insulin (HCC) 02/17/2018   S/P cesarean section 06/01/2014   S/P tubal ligation 06/01/2014   H/O cesarean section complicating pregnancy 04/18/2014   Emotional disorder 03/16/2013   Tobacco user 03/16/2013   Acute chest pain 03/01/2013   HTN (hypertension) 09/28/2012   DM (diabetes mellitus) (HCC) 11/06/2011    Allergies: No Known Allergies Medications:  Current Outpatient Medications:    albuterol (VENTOLIN HFA)  108 (90 Base) MCG/ACT inhaler, Inhale 2 puffs into the lungs every 6 (six) hours as needed for wheezing or shortness of breath., Disp: 8 g, Rfl: 0   benzonatate (TESSALON) 100 MG capsule, Take 1 capsule (100 mg total) by mouth 3 (three) times daily as needed for cough., Disp: 30 capsule,  Rfl: 0   nirmatrelvir/ritonavir EUA (PAXLOVID) 20 x 150 MG & 10 x 100MG  TABS, Take 3 tablets by mouth 2 (two) times daily for 5 days. (Take nirmatrelvir 150 mg two tablets twice daily for 5 days and ritonavir 100 mg one tablet twice daily for 5 days) Patient GFR is 115, Disp: 30 tablet, Rfl: 0   atorvastatin (LIPITOR) 10 MG tablet, Take 10 mg by mouth daily., Disp: , Rfl:    Blood Glucose Monitoring Suppl (ACCU-CHEK GUIDE) w/Device KIT, 1 each by Other route 4 (four) times daily -  before meals and at bedtime., Disp: 1 kit, Rfl: 0   gabapentin (NEURONTIN) 300 MG capsule, Take 1 capsule (300 mg total) by mouth 3 (three) times daily., Disp: 90 capsule, Rfl: 2   glucose blood (ACCU-CHEK GUIDE) test strip, Use as instructed, Disp: 100 each, Rfl: 12   insulin detemir (LEVEMIR) 100 UNIT/ML injection, Inject 0.1 mLs (10 Units total) into the skin at bedtime., Disp: 10 mL, Rfl: 0   Insulin Pen Needle (PEN NEEDLES) 31G X 8 MM MISC, UAD, Disp: 100 each, Rfl: 0   Lancets (ACCU-CHEK MULTICLIX) lancets, Use as instructed, Disp: 100 each, Rfl: 12   lisinopril (ZESTRIL) 10 MG tablet, Take 1 tablet (10 mg total) by mouth daily., Disp: 30 tablet, Rfl: 2   metFORMIN (GLUCOPHAGE-XR) 500 MG 24 hr tablet, TAKE 1 TABLET BY MOUTH TWICE DAILY WITH A MEAL, Disp: 180 tablet, Rfl: 0   nicotine (NICODERM CQ - DOSED IN MG/24 HOURS) 14 mg/24hr patch, Place 1 patch (14 mg total) onto the skin daily., Disp: 28 patch, Rfl: 2   SUMAtriptan (IMITREX) 25 MG tablet, Take 25 mg (1 tablet total) by mouth at the start of the headache. May repeat in 2 hours x 1 if headache persists. Max of 2 tablets/24 hours., Disp: 30 tablet, Rfl: 1  Observations/Objective: Patient is well-developed, well-nourished in no acute distress.  Resting comfortably at home.  Head is normocephalic, atraumatic.  No labored breathing. Speech is clear and coherent with logical content.  Patient is alert and oriented at baseline.   Assessment and Plan: 1.  COVID-19 - MyChart COVID-19 home monitoring program; Future - benzonatate (TESSALON) 100 MG capsule; Take 1 capsule (100 mg total) by mouth 3 (three) times daily as needed for cough.  Dispense: 30 capsule; Refill: 0 - albuterol (VENTOLIN HFA) 108 (90 Base) MCG/ACT inhaler; Inhale 2 puffs into the lungs every 6 (six) hours as needed for wheezing or shortness of breath.  Dispense: 8 g; Refill: 0 - nirmatrelvir/ritonavir EUA (PAXLOVID) 20 x 150 MG & 10 x 100MG  TABS; Take 3 tablets by mouth 2 (two) times daily for 5 days. (Take nirmatrelvir 150 mg two tablets twice daily for 5 days and ritonavir 100 mg one tablet twice daily for 5 days) Patient GFR is 115  Dispense: 30 tablet; Refill: 0  Patient with multiple risk factors for complicated course of illness. Discussed risks/benefits of antiviral medications including most common potential ADRs. Patient voiced understanding and would like to proceed with antiviral medication. They are candidate for Paxlovid giving recent normal CMP with GFR 115. Rx sent to pharmacy. Supportive measures, OTC medications and vitamin regimen  reviewed. Tessalon and Albuterol per orders. Patient has been enrolled in a MyChart COVID symptom monitoring program. Anne Shutter reviewed in detail. Strict ER precautions discussed with patient.    Follow Up Instructions: I discussed the assessment and treatment plan with the patient. The patient was provided an opportunity to ask questions and all were answered. The patient agreed with the plan and demonstrated an understanding of the instructions.  A copy of instructions were sent to the patient via MyChart unless otherwise noted below.   The patient was advised to call back or seek an in-person evaluation if the symptoms worsen or if the condition fails to improve as anticipated.  Time:  I spent 8 minutes with the patient via telehealth technology discussing the above problems/concerns.    [name] Climes, PA-C

## 2021-09-16 NOTE — Telephone Encounter (Signed)
  Chief Complaint: + COVID Symptoms: cough, body aches, congestion, SOB exertion, chest tightness Frequency: symptoms yesterday Pertinent Negatives: Patient denies   Disposition: [] ED /[] Urgent Care (no appt availability in office) / [] Appointment(In office/virtual)/ [x]  [facility]/ [] Home Care/ [] Refused Recommended Disposition /[] [facility]/ []  Follow-up with PCP Additional Notes:

## 2021-09-17 NOTE — Progress Notes (Signed)
Patient ID: [name] [name], female    DOB: [birth year]  MRN: [id number]  CC: Chronic Care Management   Subjective: [name] [name] is a 41 y.o. female who presents for chronic care management.   Her concerns today include:  - Doing well on blood pressure and diabetes medications without issues or concerns. She is not checking blood sugars at home because pricking finger hurts. She is not checking blood pressures at home.  - Sumatriptan was helping for headaches until recently having Covid. Declines referral to Neurology.  - Decreased smoking since beginning nicotine path and nicotine gum. - Reports psychiatry referral called her and she told them she did not want to schedule an appointment.    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   Hypertensive retinopathy of both eyes 11/08/2018   Nuclear age-related cataract, both eyes 11/08/2018   MDD (major depressive disorder), recurrent episode, moderate (HCC) 08/04/2018   PTSD (post-traumatic stress disorder) 08/04/2018   MDD (major depressive disorder), severe (HCC) 07/12/2018   GERD (gastroesophageal reflux disease) 02/17/2018   Type 2 diabetes mellitus without complication, without long-term current use of insulin (HCC) 02/17/2018   S/P cesarean section 06/01/2014   S/P tubal ligation 06/01/2014   H/O cesarean section complicating pregnancy 04/18/2014   Emotional disorder 03/16/2013   Tobacco user 03/16/2013   Acute chest pain 03/01/2013   HTN (hypertension) 09/28/2012   DM (diabetes mellitus) (HCC) 11/06/2011     Current Outpatient Medications on File Prior to Visit  Medication Sig Dispense Refill   albuterol (VENTOLIN HFA) 108 (90 Base) MCG/ACT inhaler Inhale 2 puffs into the lungs every 6 (six) hours as needed for wheezing or shortness of breath. 8 g 0   atorvastatin (LIPITOR) 10 MG tablet Take 10 mg by mouth daily.     Blood Glucose Monitoring Suppl (ACCU-CHEK GUIDE) w/Device KIT 1 each by  Other route 4 (four) times daily -  before meals and at bedtime. 1 kit 0   gabapentin (NEURONTIN) 300 MG capsule Take 1 capsule (300 mg total) by mouth 3 (three) times daily. 90 capsule 2   glucose blood (ACCU-CHEK GUIDE) test strip Use as instructed 100 each 12   insulin detemir (LEVEMIR) 100 UNIT/ML injection Inject 0.1 mLs (10 Units total) into the skin at bedtime. 10 mL 0   Insulin Pen Needle (PEN NEEDLES) 31G X 8 MM MISC UAD 100 each 0   Lancets (ACCU-CHEK MULTICLIX) lancets Use as instructed 100 each 12   lisinopril (ZESTRIL) 10 MG tablet Take 1 tablet (10 mg total) by mouth daily. 30 tablet 2   metFORMIN (GLUCOPHAGE-XR) 500 MG 24 hr tablet TAKE 1 TABLET BY MOUTH TWICE DAILY WITH A MEAL 180 tablet 0   nicotine (NICODERM CQ - DOSED IN MG/24 HOURS) 14 mg/24hr patch Place 1 patch (14 mg total) onto the skin daily. 28 patch 2   SUMAtriptan (IMITREX) 25 MG tablet Take 25 mg (1 tablet total) by mouth at the start of the headache. May repeat in 2 hours x 1 if headache persists. Max of 2 tablets/24 hours. 30 tablet 1   No current facility-administered medications on file prior to visit.    No Known Allergies  Social History   Socioeconomic History   Marital status: Legally Separated    Spouse name: Not on file   Number of children: Not on file   Years of education: Not on file   Highest education level: Not on file  Occupational  History   Not on file  Tobacco Use   Smoking status: Every Day    Packs/day: 2.00    Years: 12.00    Total pack years: 24.00    Types: Cigarettes    Passive exposure: Current   Smokeless tobacco: Never  Vaping Use   Vaping Use: Never used  Substance and Sexual Activity   Alcohol use: No   Drug use: No   Sexual activity: Yes    Birth control/protection: None  Other Topics Concern   Not on file  Social History Narrative   Not on file   Social Determinants of Health   Financial Resource Strain: Not on file  Food Insecurity: Not on file   Transportation Needs: Not on file  Physical Activity: Not on file  Stress: Not on file  Social Connections: Not on file  Intimate Partner Violence: Not on file    Family History  Problem Relation Age of Onset   Diabetes Maternal Grandmother    Diabetes Paternal Grandmother     Past Surgical History:  Procedure Laterality Date   CESAREAN SECTION     CESAREAN SECTION WITH BILATERAL TUBAL LIGATION Bilateral 06/01/2014   Procedure: CESAREAN SECTION WITH BILATERAL TUBAL LIGATION;  Surgeon: Sherian Rein, MD;  Location: [facility];  Service: Obstetrics;  Laterality: Bilateral;  MD requests RNFA   CHEST TUBE INSERTION     TONSILLECTOMY     TRACHEOSTOMY      ROS: Review of Systems Negative except as stated above  PHYSICAL EXAM: BP (!) 145/79   Pulse 90   Temp 98.3 F (36.8 C)   Resp 16   Ht 5' 2.21" (1.58 m)   Wt 175 lb (79.4 kg)   SpO2 94%   BMI 31.80 kg/m   Physical Exam HENT:     Head: Normocephalic and atraumatic.  Eyes:     Extraocular Movements: Extraocular movements intact.     Conjunctiva/sclera: Conjunctivae normal.     Pupils: Pupils are equal, round, and reactive to light.  Cardiovascular:     Rate and Rhythm: Normal rate and regular rhythm.     Pulses: Normal pulses.     Heart sounds: Normal heart sounds.  Pulmonary:     Effort: Pulmonary effort is normal.     Breath sounds: Normal breath sounds.  Musculoskeletal:     Cervical back: Normal range of motion and neck supple.  Neurological:     General: No focal deficit present.     Mental Status: She is alert and oriented to person, place, and time.  Psychiatric:        Mood and Affect: Mood normal.        Behavior: Behavior normal.     ASSESSMENT AND PLAN: 1. Type 2 diabetes mellitus with diabetic mononeuropathy, with long-term current use of insulin (HCC) - Continue Metformin XR and Insulin Detemir as prescribed. No refills needed as of present. - Discussed the importance of healthy eating  habits, low-carbohydrate diet, low-sugar diet, regular aerobic exercise (at least 150 minutes a week as tolerated) and medication compliance to achieve or maintain control of diabetes. - Update hemoglobin A1c.  - Follow-up with primary provider as scheduled. - Hemoglobin A1c  2. Essential (primary) hypertension - Blood pressure not at goal during today's visit. Patient asymptomatic without chest pressure, chest pain, palpitations, shortness of breath, worst headache of life, and any additional red flag symptoms. - Increase Lisinopril from 10 mg daily to 20 mg daily. Discussed with patient to take two  10 mg tablets to equal new dose.  - Counseled on blood pressure goal of less than 130/80, low-sodium, DASH diet, medication compliance, and 150 minutes of moderate intensity exercise per week as tolerated. Counseled on medication adherence and adverse effects. - Follow-up with clinical pharmacist Georgiana Shore Ausdall in 2 weeks or sooner if needed for blood pressure check.  3. Migraine without aura and without status migrainosus, not intractable - Continue Sumatriptan as prescribed. No refills needed as of present.  - Patient declined referral to Neurology.  - Follow-up with primary provider as scheduled.   4. Encounter for smoking cessation counseling - Continue nicotine polacrilex and nicotine patch as prescribed. No refills needed as of present.  - Follow-up with primary provider as scheduled.  5. Anxiety and depression 6. History of bipolar disorder - Patient reports psychiatry referral called to schedule appointment and she declined.     Patient was given the opportunity to ask questions.  Patient verbalized understanding of the plan and was able to repeat key elements of the plan. Patient was given clear instructions to go to Emergency Department or return to medical center if symptoms don't improve, worsen, or new problems develop.The patient verbalized understanding.   Orders Placed This  Encounter  Procedures   Hemoglobin A1c    Return in about 2 weeks (around 10/08/2021) for Follow-Up or next available bp check with Georgiana Shore Ausdall, RPH-CPP.  Rema Fendt, NP

## 2021-09-18 ENCOUNTER — Ambulatory Visit: Payer: No Typology Code available for payment source | Admitting: Family

## 2021-09-18 DIAGNOSIS — I10 Essential (primary) hypertension: Secondary | ICD-10-CM

## 2021-09-18 DIAGNOSIS — Z794 Long term (current) use of insulin: Secondary | ICD-10-CM

## 2021-09-24 ENCOUNTER — Encounter: Payer: Self-pay | Admitting: Family

## 2021-09-24 ENCOUNTER — Ambulatory Visit (INDEPENDENT_AMBULATORY_CARE_PROVIDER_SITE_OTHER): Payer: No Typology Code available for payment source | Admitting: Family

## 2021-09-24 VITALS — BP 145/79 | HR 90 | Temp 98.3°F | Resp 16 | Ht 62.21 in | Wt 175.0 lb

## 2021-09-24 DIAGNOSIS — I10 Essential (primary) hypertension: Secondary | ICD-10-CM | POA: Diagnosis not present

## 2021-09-24 DIAGNOSIS — F32A Depression, unspecified: Secondary | ICD-10-CM

## 2021-09-24 DIAGNOSIS — F419 Anxiety disorder, unspecified: Secondary | ICD-10-CM

## 2021-09-24 DIAGNOSIS — Z716 Tobacco abuse counseling: Secondary | ICD-10-CM | POA: Diagnosis not present

## 2021-09-24 DIAGNOSIS — E1141 Type 2 diabetes mellitus with diabetic mononeuropathy: Secondary | ICD-10-CM

## 2021-09-24 DIAGNOSIS — Z794 Long term (current) use of insulin: Secondary | ICD-10-CM

## 2021-09-24 DIAGNOSIS — Z8659 Personal history of other mental and behavioral disorders: Secondary | ICD-10-CM

## 2021-09-24 DIAGNOSIS — G43009 Migraine without aura, not intractable, without status migrainosus: Secondary | ICD-10-CM | POA: Diagnosis not present

## 2021-09-24 NOTE — Progress Notes (Signed)
.  Pt presents for chronic care management   -pt states did not make appt w/Luke due to having to work, pt stated the ofc was suppose to call back and reschedule has not heard from them

## 2021-09-24 NOTE — Patient Instructions (Signed)
Lisinopril Tablets What is this medication? LISINOPRIL (lyse IN oh pril) treats high blood pressure and heart failure. It may also be used to prevent further damage after a heart attack. It works by relaxing blood vessels, which decreases the amount of work the heart has to do. It belongs to a group of medications called ACE inhibitors. This medicine may be used for other purposes; ask your health care provider or pharmacist if you have questions. COMMON BRAND NAME(S): Prinivil, Zestril What should I tell my care team before I take this medication? They need to know if you have any of these conditions: Diabetes Heart or blood vessel disease History of swelling of the tongue, face, or lips with difficulty breathing, difficulty swallowing, hoarseness, or tightening of the throat (angioedema) Kidney disease Low blood pressure An unusual or allergic reaction to lisinopril, other ACE inhibitors, insect venom, foods, dyes, or preservatives Pregnant or trying to get pregnant Breast-feeding How should I use this medication? Take this medication by mouth. Take it as directed on the prescription label at the same time every day. You can take it with or without food. If it upsets your stomach, take it with food. Keep taking it unless your care team tells you to stop. Talk to your care team about the use of this medication in children. While it may be prescribed for children as young as 6 for selected conditions, precautions do apply. Overdosage: If you think you have taken too much of this medicine contact a poison control center or emergency room at once. NOTE: This medicine is only for you. Do not share this medicine with others. What if I miss a dose? If you miss a dose, take it as soon as you can. If it is almost time for your next dose, take only that dose. Do not take double or extra doses. What may interact with this medication? Do not take this medication with any of the following: Hymenoptera  venom Sacubitril; valsartan This medication may also interact with the following: Aliskiren Angiotensin receptor blockers, like losartan or valsartan Certain medications for diabetes Diuretics Everolimus Gold compounds Lithium NSAIDs, medications for pain and inflammation, like ibuprofen or naproxen Potassium salts or supplements Salt substitutes Sirolimus Temsirolimus This list may not describe all possible interactions. Give your health care provider a list of all the medicines, herbs, non-prescription drugs, or dietary supplements you use. Also tell them if you smoke, drink alcohol, or use illegal drugs. Some items may interact with your medicine. What should I watch for while using this medication? Visit your health care provider for regular check-ups. Check your blood pressure as directed. Ask your health care provider what your blood pressure should be. Also, find out when you should contact him or her. Do not treat yourself for coughs, colds, or pain while you are using this medication without asking your health care provider for advice. Some medications may increase your blood pressure. Inform your health care provider if you wish to become pregnant or think you might be pregnant. There is a potential for serious side effects to an unborn child. Talk to your health care provider for more information. You may get drowsy or dizzy. Do not drive, use machinery, or do anything that needs mental alertness until you know how this medication affects you. Do not stand or sit up quickly, especially if you are an older patient. This reduces the risk of dizzy or fainting spells. Alcohol can make you more drowsy and dizzy. Avoid alcoholic drinks. Avoid   salt substitutes unless you are told otherwise by your health care provider. What side effects may I notice from receiving this medication? Side effects that you should report to your care team as soon as possible: Allergic reactions or  angioedema--skin rash, itching, hives, swelling of the face, eyes, lips, tongue, arms, or legs, trouble swallowing or breathing High potassium level--muscle weakness, fast or irregular heartbeat Kidney injury--decrease in the amount of urine, swelling of the ankles, hands, or feet Liver injury--right upper belly pain, loss of appetite, nausea, light-colored stool, dark yellow or brown urine, yellowing skin or eyes, unusual weakness, fatigue Low blood pressure--dizziness, feeling faint or lightheaded, blurry vision Side effects that usually do not require medical attention (report to your care team if they continue or are bothersome): Cough Dizziness Headache This list may not describe all possible side effects. Call your doctor for medical advice about side effects. You may report side effects to FDA at 1-800-FDA-1088. Where should I keep my medication? Keep out of the reach of children and pets. Store at room temperature between 20 and 25 degrees C (68 and 77 degrees F). Protect from moisture. Keep the container tightly closed. Do not freeze. Avoid exposure to extreme heat. Get rid of any unused medication after the expiration date. To get rid of medications that are no longer needed or have expired: Take the medication to a medication take-back program. Check with your pharmacy or law enforcement to find a location. If you cannot return the medication, check the label or package insert to see if the medication should be thrown out in the garbage or flushed down the toilet. If you are not sure, ask your care team. If it is safe to put in the trash, empty the medication out of the container. Mix the medication with cat litter, dirt, coffee grounds, or other unwanted substance. Seal the mixture in a bag or container. Put it in the trash. NOTE: This sheet is a summary. It may not cover all possible information. If you have questions about this medicine, talk to your doctor, pharmacist, or health care  provider.  2023 Elsevier/Gold Standard (2019-11-10 00:00:00)  

## 2021-09-25 ENCOUNTER — Other Ambulatory Visit: Payer: Self-pay | Admitting: Family

## 2021-09-25 ENCOUNTER — Telehealth: Payer: Self-pay

## 2021-09-25 DIAGNOSIS — E1141 Type 2 diabetes mellitus with diabetic mononeuropathy: Secondary | ICD-10-CM

## 2021-09-25 LAB — HEMOGLOBIN A1C
Est. average glucose Bld gHb Est-mCnc: 235 mg/dL
Hgb A1c MFr Bld: 9.8 % — ABNORMAL HIGH (ref 4.8–5.6)

## 2021-09-25 MED ORDER — DULAGLUTIDE 0.75 MG/0.5ML ~~LOC~~ SOAJ: 0.7500 mg | SUBCUTANEOUS | 0 refills | Status: DC

## 2021-09-25 NOTE — Telephone Encounter (Signed)
Pt. Given lab results and instructions, verbalizes understanding. 

## 2021-10-03 ENCOUNTER — Other Ambulatory Visit: Payer: Self-pay

## 2021-10-03 DIAGNOSIS — Z794 Long term (current) use of insulin: Secondary | ICD-10-CM

## 2021-10-03 MED ORDER — GABAPENTIN 300 MG PO CAPS: 300.0000 mg | ORAL_CAPSULE | Freq: Three times a day (TID) | ORAL | 0 refills | Status: DC

## 2021-10-05 NOTE — Progress Notes (Signed)
Patient ID: NYKIRA FLANERY, female    DOB: 1980/04/16  MRN: [id number]  CC: Chronic Care Management  Subjective: [name] [name] is a 41 y.o. female who presents for chronic care management.   Her concerns today include:  Doing well on blood pressure medication without issues or concerns. Doing well on Metformin and Levemir. Trulicity causing nausea for days. No further issues or concerns.   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   Hypertensive retinopathy of both eyes 11/08/2018   Nuclear age-related cataract, both eyes 11/08/2018   MDD (major depressive disorder), recurrent episode, moderate (HCC) 08/04/2018   PTSD (post-traumatic stress disorder) 08/04/2018   MDD (major depressive disorder), severe (HCC) 07/12/2018   GERD (gastroesophageal reflux disease) 02/17/2018   Type 2 diabetes mellitus without complication, without long-term current use of insulin (HCC) 02/17/2018   S/P cesarean section 06/01/2014   S/P tubal ligation 06/01/2014   H/O cesarean section complicating pregnancy 04/18/2014   Emotional disorder 03/16/2013   Tobacco user 03/16/2013   Acute chest pain 03/01/2013   HTN (hypertension) 09/28/2012   DM (diabetes mellitus) (HCC) 11/06/2011     Current Outpatient Medications on File Prior to Visit  Medication Sig Dispense Refill   albuterol (VENTOLIN HFA) 108 (90 Base) MCG/ACT inhaler Inhale 2 puffs into the lungs every 6 (six) hours as needed for wheezing or shortness of breath. 8 g 0   atorvastatin (LIPITOR) 10 MG tablet Take 10 mg by mouth daily.     Blood Glucose Monitoring Suppl (ACCU-CHEK GUIDE) w/Device KIT 1 each by Other route 4 (four) times daily -  before meals and at bedtime. 1 kit 0   gabapentin (NEURONTIN) 300 MG capsule Take 1 capsule (300 mg total) by mouth 3 (three) times daily. 270 capsule 0   glucose blood (ACCU-CHEK GUIDE) test strip Use as instructed 100 each 12   Lancets (ACCU-CHEK MULTICLIX) lancets Use  as instructed 100 each 12   nicotine (NICODERM CQ - DOSED IN MG/24 HOURS) 14 mg/24hr patch Place 1 patch (14 mg total) onto the skin daily. 28 patch 2   SUMAtriptan (IMITREX) 25 MG tablet Take 25 mg (1 tablet total) by mouth at the start of the headache. May repeat in 2 hours x 1 if headache persists. Max of 2 tablets/24 hours. 30 tablet 1   No current facility-administered medications on file prior to visit.    No Known Allergies  Social History   Socioeconomic History   Marital status: Legally Separated    Spouse name: Not on file   Number of children: Not on file   Years of education: Not on file   Highest education level: Not on file  Occupational History   Not on file  Tobacco Use   Smoking status: Every Day    Packs/day: 2.00    Years: 12.00    Total pack years: 24.00    Types: Cigarettes    Passive exposure: Current   Smokeless tobacco: Never  Vaping Use   Vaping Use: Never used  Substance and Sexual Activity   Alcohol use: No   Drug use: No   Sexual activity: Yes    Birth control/protection: None  Other Topics Concern   Not on file  Social History Narrative   Not on file   Social Determinants of Health   Financial Resource Strain: Not on file  Food Insecurity: Not on file  Transportation Needs: Not on file  Physical Activity: Not on  file  Stress: Not on file  Social Connections: Not on file  Intimate Partner Violence: Not on file    Family History  Problem Relation Age of Onset   Diabetes Maternal Grandmother    Diabetes Paternal Grandmother     Past Surgical History:  Procedure Laterality Date   CESAREAN SECTION     CESAREAN SECTION WITH BILATERAL TUBAL LIGATION Bilateral 06/01/2014   Procedure: CESAREAN SECTION WITH BILATERAL TUBAL LIGATION;  Surgeon: Sherian Rein, MD;  Location: [facility];  Service: Obstetrics;  Laterality: Bilateral;  MD requests RNFA   CHEST TUBE INSERTION     TONSILLECTOMY     TRACHEOSTOMY      ROS: Review of  Systems Negative except as stated above  PHYSICAL EXAM: BP 128/83 (BP Location: Left Arm, Patient Position: Sitting, Cuff Size: Normal)   Pulse 83   Temp 98.3 F (36.8 C)   Resp 16   Ht 5' 2.21" (1.58 m)   Wt 175 lb (79.4 kg)   SpO2 96%   BMI 31.80 kg/m   Physical Exam HENT:     Head: Normocephalic and atraumatic.  Eyes:     Extraocular Movements: Extraocular movements intact.     Conjunctiva/sclera: Conjunctivae normal.     Pupils: Pupils are equal, round, and reactive to light.  Cardiovascular:     Rate and Rhythm: Normal rate and regular rhythm.     Pulses: Normal pulses.     Heart sounds: Normal heart sounds.  Pulmonary:     Effort: Pulmonary effort is normal.     Breath sounds: Normal breath sounds.  Musculoskeletal:     Cervical back: Normal range of motion and neck supple.  Neurological:     General: No focal deficit present.     Mental Status: She is alert and oriented to person, place, and time.  Psychiatric:        Mood and Affect: Mood normal.        Behavior: Behavior normal.     ASSESSMENT AND PLAN: 1. Primary hypertension - Continue Lisinopril as prescribed.  - Counseled on blood pressure goal of less than 130/80, low-sodium, DASH diet, medication compliance, and 150 minutes of moderate intensity exercise per week as tolerated. Counseled on medication adherence and adverse effects. - Follow-up with primary provider in 3 months or sooner if needed.  - lisinopril (ZESTRIL) 10 MG tablet; Take 1 tablet (10 mg total) by mouth daily.  Dispense: 30 tablet; Refill: 2  2. Type 2 diabetes mellitus with diabetic mononeuropathy, with long-term current use of insulin (HCC) - Hemoglobin A1c 9.8% on 09/24/2021. This is improved from previous 11.0% on 08/06/2021. - Dulaglutide discontinued due to side effect of nausea.  - Continue Metformin as prescribed.  - Continue Insulin Glargine as prescribed.  - Trial Empagliflozin. Counseled on medication adherence and adverse  effects.  - Discussed the importance of healthy eating habits, low-carbohydrate diet, low-sugar diet, regular aerobic exercise (at least 150 minutes a week as tolerated) and medication compliance to achieve or maintain control of diabetes. - Keep appointment 10/27/2021 with clinical pharmacist for diabetes checkup. Medications may be revised at that time if needed. - Follow-up with primary provider as scheduled.  - empagliflozin (JARDIANCE) 25 MG TABS tablet; Take 1 tablet (25 mg total) by mouth daily before breakfast.  Dispense: 30 tablet; Refill: 2 - insulin detemir (LEVEMIR) 100 UNIT/ML injection; Inject 0.13 mLs (13 Units total) into the skin at bedtime.  Dispense: 10 mL; Refill: 1 - metFORMIN (GLUCOPHAGE-XR) 500 MG  24 hr tablet; Take 1 tablet (500 mg total) by mouth 2 (two) times daily with a meal.  Dispense: 180 tablet; Refill: 0 - Insulin Pen Needle (PEN NEEDLES) 31G X 8 MM MISC; UAD  Dispense: 100 each; Refill: 0    Patient was given the opportunity to ask questions.  Patient verbalized understanding of the plan and was able to repeat key elements of the plan. Patient was given clear instructions to go to Emergency Department or return to medical center if symptoms don't improve, worsen, or new problems develop.The patient verbalized understanding.    Requested Prescriptions   Signed Prescriptions Disp Refills   empagliflozin (JARDIANCE) 25 MG TABS tablet 30 tablet 2    Sig: Take 1 tablet (25 mg total) by mouth daily before breakfast.   insulin detemir (LEVEMIR) 100 UNIT/ML injection 10 mL 1    Sig: Inject 0.13 mLs (13 Units total) into the skin at bedtime.   lisinopril (ZESTRIL) 10 MG tablet 30 tablet 2    Sig: Take 1 tablet (10 mg total) by mouth daily.   metFORMIN (GLUCOPHAGE-XR) 500 MG 24 hr tablet 180 tablet 0    Sig: Take 1 tablet (500 mg total) by mouth 2 (two) times daily with a meal.   Insulin Pen Needle (PEN NEEDLES) 31G X 8 MM MISC 100 each 0    Sig: UAD    Return in  about 3 months (around 01/13/2022) for Follow-Up or next available chronic care mgmt.  Rema Fendt, NP

## 2021-10-09 ENCOUNTER — Encounter (HOSPITAL_COMMUNITY): Payer: Self-pay | Admitting: Emergency Medicine

## 2021-10-09 ENCOUNTER — Emergency Department (HOSPITAL_COMMUNITY)
Admission: EM | Admit: 2021-10-09 | Discharge: 2021-10-10 | Discharge status: Against Medical Advice | Payer: No Typology Code available for payment source | Attending: Emergency Medicine | Admitting: Emergency Medicine

## 2021-10-09 ENCOUNTER — Other Ambulatory Visit: Payer: Self-pay

## 2021-10-09 ENCOUNTER — Emergency Department (HOSPITAL_COMMUNITY): Payer: No Typology Code available for payment source

## 2021-10-09 DIAGNOSIS — R202 Paresthesia of skin: Secondary | ICD-10-CM | POA: Insufficient documentation

## 2021-10-09 DIAGNOSIS — R42 Dizziness and giddiness: Secondary | ICD-10-CM | POA: Diagnosis present

## 2021-10-09 DIAGNOSIS — Z5321 Procedure and treatment not carried out due to patient leaving prior to being seen by health care provider: Secondary | ICD-10-CM | POA: Diagnosis not present

## 2021-10-09 LAB — DIFFERENTIAL
Abs Immature Granulocytes: 0.03 10*3/uL (ref 0.00–0.07)
Basophils Absolute: 0 10*3/uL (ref 0.0–0.1)
Basophils Relative: 0 %
Eosinophils Absolute: 0.1 10*3/uL (ref 0.0–0.5)
Eosinophils Relative: 1 %
Immature Granulocytes: 0 %
Lymphocytes Relative: 35 %
Lymphs Abs: 3.8 10*3/uL (ref 0.7–4.0)
Monocytes Absolute: 0.6 10*3/uL (ref 0.1–1.0)
Monocytes Relative: 6 %
Neutro Abs: 6.1 10*3/uL (ref 1.7–7.7)
Neutrophils Relative %: 58 %

## 2021-10-09 LAB — CBC
HCT: 40.9 % (ref 36.0–46.0)
Hemoglobin: 13.9 g/dL (ref 12.0–15.0)
MCH: 28.8 pg (ref 26.0–34.0)
MCHC: 34 g/dL (ref 30.0–36.0)
MCV: 84.9 fL (ref 80.0–100.0)
Platelets: 330 10*3/uL (ref 150–400)
RBC: 4.82 MIL/uL (ref 3.87–5.11)
RDW: 13 % (ref 11.5–15.5)
WBC: 10.6 10*3/uL — ABNORMAL HIGH (ref 4.0–10.5)
nRBC: 0 % (ref 0.0–0.2)

## 2021-10-09 LAB — PROTIME-INR
INR: 1 (ref 0.8–1.2)
Prothrombin Time: 12.6 seconds (ref 11.4–15.2)

## 2021-10-09 LAB — COMPREHENSIVE METABOLIC PANEL
ALT: 13 U/L (ref 0–44)
AST: 13 U/L — ABNORMAL LOW (ref 15–41)
Albumin: 3.4 g/dL — ABNORMAL LOW (ref 3.5–5.0)
Alkaline Phosphatase: 60 U/L (ref 38–126)
Anion gap: 14 (ref 5–15)
BUN: 6 mg/dL (ref 6–20)
CO2: 18 mmol/L — ABNORMAL LOW (ref 22–32)
Calcium: 8.8 mg/dL — ABNORMAL LOW (ref 8.9–10.3)
Chloride: 105 mmol/L (ref 98–111)
Creatinine, Ser: 0.56 mg/dL (ref 0.44–1.00)
GFR, Estimated: >60 mL/min (ref >60)
Glucose, Bld: 277 mg/dL — ABNORMAL HIGH (ref 70–99)
Potassium: 3.9 mmol/L (ref 3.5–5.1)
Sodium: 137 mmol/L (ref 135–145)
Total Bilirubin: 0.2 mg/dL — ABNORMAL LOW (ref 0.3–1.2)
Total Protein: 6.3 g/dL — ABNORMAL LOW (ref 6.5–8.1)

## 2021-10-09 LAB — APTT: aPTT: 27 seconds (ref 24–36)

## 2021-10-09 LAB — ETHANOL: Alcohol, Ethyl (B): <10 mg/dL (ref <10)

## 2021-10-09 LAB — CBG MONITORING, ED: Glucose-Capillary: 273 mg/dL — ABNORMAL HIGH (ref 70–99)

## 2021-10-09 NOTE — ED Provider Triage Note (Signed)
Emergency Medicine Provider Triage Evaluation Note  [name] [name] , a 41 y.o. female  was evaluated in triage.  Pt complains of constant dizziness and right facial tingling. She woke up feeling like this.  No weakness, numbness, gait changes, balance issues, nausea, vomiting   She has left facial twitching which is supposedly chronic.   Review of Systems  Positive:  Negative:   Physical Exam  There were no vitals taken for this visit. Gen:   Awake, no distress   Resp:  Normal effort  MSK:   Moves extremities without difficulty  Other:  No motor deficits. PERRLA, EOM intact, no nystagmus. There appears to be possible left facial droop, but none noted when patient smiles for me.   Medical Decision Making  Medically screening exam initiated at 8:20 PM.  Appropriate orders placed.  Rafael Bihari was informed that the remainder of the evaluation will be completed by another provider, this initial triage assessment does not replace that evaluation, and the importance of remaining in the ED until their evaluation is complete.  Concern for bells palsy versus CVA. Out of stroke window.   Adolphus Birchwood, PA-C 10/09/21 2025

## 2021-10-09 NOTE — ED Triage Notes (Signed)
Patient reports headache and bilateral facial numbness onset 9 am this morning .

## 2021-10-10 ENCOUNTER — Telehealth: Payer: Self-pay

## 2021-10-10 NOTE — ED Notes (Signed)
X2 for vitals recheck with no response °

## 2021-10-10 NOTE — Telephone Encounter (Signed)
Pt. Went to ED 10/10/21. Still having numbness and tingling to face. Does not want to return to ED.Needs a note that it is safe for he to return to work today. Please advise.

## 2021-10-13 ENCOUNTER — Ambulatory Visit (INDEPENDENT_AMBULATORY_CARE_PROVIDER_SITE_OTHER): Payer: No Typology Code available for payment source | Admitting: Family

## 2021-10-13 VITALS — BP 128/83 | HR 83 | Temp 98.3°F | Resp 16 | Ht 62.21 in | Wt 175.0 lb

## 2021-10-13 DIAGNOSIS — I10 Essential (primary) hypertension: Secondary | ICD-10-CM

## 2021-10-13 DIAGNOSIS — Z794 Long term (current) use of insulin: Secondary | ICD-10-CM

## 2021-10-13 DIAGNOSIS — E1141 Type 2 diabetes mellitus with diabetic mononeuropathy: Secondary | ICD-10-CM

## 2021-10-13 MED ORDER — PEN NEEDLES 31G X 8 MM MISC: 0 refills | Status: DC

## 2021-10-13 MED ORDER — LISINOPRIL 10 MG PO TABS: 10.0000 mg | ORAL_TABLET | Freq: Every day | ORAL | 2 refills | Status: DC

## 2021-10-13 MED ORDER — INSULIN DETEMIR 100 UNIT/ML ~~LOC~~ SOLN: 13.0000 [IU] | Freq: Every day | SUBCUTANEOUS | 1 refills | Status: DC

## 2021-10-13 MED ORDER — METFORMIN HCL ER 500 MG PO TB24: 500.0000 mg | ORAL_TABLET | Freq: Two times a day (BID) | ORAL | 0 refills | Status: DC

## 2021-10-13 MED ORDER — EMPAGLIFLOZIN 25 MG PO TABS: 25.0000 mg | ORAL_TABLET | Freq: Every day | ORAL | 2 refills | Status: AC

## 2021-10-13 NOTE — Telephone Encounter (Signed)
Pt has appt scheduled for today. ?

## 2021-10-13 NOTE — Progress Notes (Signed)
.  Pt presents for chronic care management  -pt states doing okay on Lisinopril dose increase -pt states that within hour of taking Trulicity she feels nausea for about 3 days

## 2021-10-13 NOTE — Patient Instructions (Signed)

## 2021-10-27 ENCOUNTER — Ambulatory Visit: Payer: No Typology Code available for payment source | Admitting: Pharmacist

## 2021-10-29 ENCOUNTER — Other Ambulatory Visit: Payer: Self-pay

## 2021-12-02 ENCOUNTER — Ambulatory Visit: Payer: No Typology Code available for payment source | Admitting: Pharmacist

## 2022-01-08 NOTE — Progress Notes (Signed)
Patient ID: [name] [name], female    DOB: Apr 06, 1980  MRN: [id number]  CC: Chronic Care Management   Subjective: [name] [name] is a 41 y.o. female who presents for chronic care management.   Her concerns today include:  - Doing well on Lisinopril, no issues/concerns. Reports taking Lisinopril 20 mg daily. Denies red flag symptoms.  - Doing well on Metformin, no issues/concerns. Taking Levemir as prescribed. Received a letter in the mail that her health insurance is no longer covering Levemir so needs an alternative prescribed. States the letter did not list alternatives for Levemir. Reports she never began Jardiance due to cost. States she is monitoring what she eats/exercises so doesn't understand why her diabetes is not controlled.  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   Hypertensive retinopathy of both eyes 11/08/2018   Nuclear age-related cataract, both eyes 11/08/2018   MDD (major depressive disorder), recurrent episode, moderate (HCC) 08/04/2018   PTSD (post-traumatic stress disorder) 08/04/2018   MDD (major depressive disorder), severe (HCC) 07/12/2018   GERD (gastroesophageal reflux disease) 02/17/2018   Type 2 diabetes mellitus without complication, without long-term current use of insulin (HCC) 02/17/2018   S/P cesarean section 06/01/2014   S/P tubal ligation 06/01/2014   H/O cesarean section complicating pregnancy 04/18/2014   Emotional disorder 03/16/2013   Tobacco user 03/16/2013   Acute chest pain 03/01/2013   HTN (hypertension) 09/28/2012   DM (diabetes mellitus) (HCC) 11/06/2011     Current Outpatient Medications on File Prior to Visit  Medication Sig Dispense Refill   Blood Glucose Monitoring Suppl (ACCU-CHEK GUIDE) w/Device KIT 1 each by Other route 4 (four) times daily -  before meals and at bedtime. 1 kit 0   gabapentin (NEURONTIN) 300 MG capsule Take 1 capsule (300 mg total) by mouth 3 (three) times daily. 270  capsule 0   glucose blood (ACCU-CHEK GUIDE) test strip Use as instructed 100 each 12   Insulin Pen Needle (PEN NEEDLES) 31G X 8 MM MISC UAD 100 each 0   Lancets (ACCU-CHEK MULTICLIX) lancets Use as instructed 100 each 12   nicotine (NICODERM CQ - DOSED IN MG/24 HOURS) 14 mg/24hr patch Place 1 patch (14 mg total) onto the skin daily. 28 patch 2   SUMAtriptan (IMITREX) 25 MG tablet Take 25 mg (1 tablet total) by mouth at the start of the headache. May repeat in 2 hours x 1 if headache persists. Max of 2 tablets/24 hours. 30 tablet 1   No current facility-administered medications on file prior to visit.    No Known Allergies  Social History   Socioeconomic History   Marital status: Legally Separated    Spouse name: Not on file   Number of children: Not on file   Years of education: Not on file   Highest education level: Not on file  Occupational History   Not on file  Tobacco Use   Smoking status: Every Day    Packs/day: 2.00    Years: 12.00    Total pack years: 24.00    Types: Cigarettes    Passive exposure: Current   Smokeless tobacco: Never  Vaping Use   Vaping Use: Never used  Substance and Sexual Activity   Alcohol use: No   Drug use: No   Sexual activity: Yes    Birth control/protection: None  Other Topics Concern   Not on file  Social History Narrative   Not on file   Social Determinants of  Health   Financial Resource Strain: Not on file  Food Insecurity: Not on file  Transportation Needs: Not on file  Physical Activity: Not on file  Stress: Not on file  Social Connections: Not on file  Intimate Partner Violence: Not on file    Family History  Problem Relation Age of Onset   Diabetes Maternal Grandmother    Diabetes Paternal Grandmother     Past Surgical History:  Procedure Laterality Date   CESAREAN SECTION     CESAREAN SECTION WITH BILATERAL TUBAL LIGATION Bilateral 06/01/2014   Procedure: CESAREAN SECTION WITH BILATERAL TUBAL LIGATION;  Surgeon:  Sherian Rein, MD;  Location: [facility];  Service: Obstetrics;  Laterality: Bilateral;  MD requests RNFA   CHEST TUBE INSERTION     TONSILLECTOMY     TRACHEOSTOMY      ROS: Review of Systems Negative except as stated above  PHYSICAL EXAM: BP (!) 155/93   Pulse 71   Temp 98.3 F (36.8 C)   Resp 16   Ht 5' 2.21" (1.58 m)   Wt 180 lb (81.6 kg)   SpO2 100%   BMI 32.71 kg/m   Physical Exam HENT:     Head: Normocephalic and atraumatic.  Eyes:     Extraocular Movements: Extraocular movements intact.     Conjunctiva/sclera: Conjunctivae normal.     Pupils: Pupils are equal, round, and reactive to light.  Cardiovascular:     Rate and Rhythm: Normal rate and regular rhythm.     Pulses: Normal pulses.     Heart sounds: Normal heart sounds.  Pulmonary:     Effort: Pulmonary effort is normal.     Breath sounds: Normal breath sounds.  Musculoskeletal:     Cervical back: Normal range of motion and neck supple.  Neurological:     General: No focal deficit present.     Mental Status: She is alert and oriented to person, place, and time.  Psychiatric:        Mood and Affect: Mood normal.        Behavior: Behavior normal.   Results for orders placed or performed in visit on 01/13/22  POCT glycosylated hemoglobin (Hb A1C)  Result Value Ref Range   Hemoglobin A1C 9.7 (A) 4.0 - 5.6 %   HbA1c POC (<> result, manual entry)     HbA1c, POC (prediabetic range)     HbA1c, POC (controlled diabetic range)      ASSESSMENT AND PLAN: 1. Primary hypertension - Blood pressure not at goal during today's visit. Patient asymptomatic without chest pressure, chest pain, palpitations, shortness of breath, worst headache of life, and any additional red flag symptoms. - Increase Lisinopril from 20 mg daily to 40 mg daily.  - Counseled on blood pressure goal of less than 130/80, low-sodium, DASH diet, medication compliance, and 150 minutes of moderate intensity exercise per week as tolerated.  Counseled on medication adherence and adverse effects. - Follow-up with primary provider in 2 to 4 weeks or sooner if needed for blood pressure check. - lisinopril (ZESTRIL) 40 MG tablet; Take 1 tablet (40 mg total) by mouth daily.  Dispense: 30 tablet; Refill: 2  2. Uncontrolled type 2 diabetes mellitus with hyperglycemia (HCC) - Hemoglobin A1c not at goal at 9.7%, goal 7%. This is similar to previous 9.8%. - Continue Metformin as prescribed.  - Levemir discontinued due to no longer covered by patient's health insurance.  - Begin Insulin Glargine-YFGN as prescribed. Counseled on medication adherence/adverse effects.  - Sitagliptin as  prescribed. Counseled on medication adherence/adverse effects.  - Patient intolerant to Dulaglutide due to side effect of nausea.  - Follow-up with clinical pharmacist in 4 weeks or sooner if needed for diabetes checkup. Write your home blood sugar results down each day and bring those results to your appointment along with your home glucose monitor. Medications may be revised at that time if needed. - Referral to Endocrinology for further evaluation/management. During the interim patient to follow-up with me as needed until established with referral. - insulin glargine-yfgn (SEMGLEE) 100 UNIT/ML Pen; Inject 15 Units into the skin at bedtime.  Dispense: 15 mL; Refill: 0 - Ambulatory referral to Endocrinology - sitaGLIPtin (JANUVIA) 25 MG tablet; Take 1 tablet (25 mg total) by mouth daily.  Dispense: 30 tablet; Refill: 2 - metFORMIN (GLUCOPHAGE-XR) 500 MG 24 hr tablet; Take 2 tablets (1,000 mg total) by mouth 2 (two) times daily with a meal.  Dispense: 120 tablet; Refill: 2 - POCT glycosylated hemoglobin (Hb A1C)  3. Screening cholesterol level - Routine screening. Patient plans to return at later date to have obtained.  - Lipid panel; Future   Patient was given the opportunity to ask questions.  Patient verbalized understanding of the plan and was able to repeat  key elements of the plan. Patient was given clear instructions to go to Emergency Department or return to medical center if symptoms don't improve, worsen, or new problems develop.The patient verbalized understanding.   Orders Placed This Encounter  Procedures   Lipid panel   Ambulatory referral to Endocrinology   POCT glycosylated hemoglobin (Hb A1C)     Requested Prescriptions   Signed Prescriptions Disp Refills   insulin glargine-yfgn (SEMGLEE) 100 UNIT/ML Pen 15 mL 0    Sig: Inject 15 Units into the skin at bedtime.   lisinopril (ZESTRIL) 40 MG tablet 30 tablet 2    Sig: Take 1 tablet (40 mg total) by mouth daily.   sitaGLIPtin (JANUVIA) 25 MG tablet 30 tablet 2    Sig: Take 1 tablet (25 mg total) by mouth daily.   metFORMIN (GLUCOPHAGE-XR) 500 MG 24 hr tablet 120 tablet 2    Sig: Take 2 tablets (1,000 mg total) by mouth 2 (two) times daily with a meal.    Return in about 4 weeks (around 02/10/2022) for Follow-Up or next available with Georgiana Shore Ausdall RPH-CPP at [facility] chronic care mgmt and lab 01/14/22.  Rema Fendt, NP

## 2022-01-13 ENCOUNTER — Ambulatory Visit (INDEPENDENT_AMBULATORY_CARE_PROVIDER_SITE_OTHER): Payer: No Typology Code available for payment source | Admitting: Family

## 2022-01-13 VITALS — BP 155/93 | HR 71 | Temp 98.3°F | Resp 16 | Ht 62.21 in | Wt 180.0 lb

## 2022-01-13 DIAGNOSIS — I10 Essential (primary) hypertension: Secondary | ICD-10-CM | POA: Diagnosis not present

## 2022-01-13 DIAGNOSIS — E1141 Type 2 diabetes mellitus with diabetic mononeuropathy: Secondary | ICD-10-CM

## 2022-01-13 DIAGNOSIS — E1165 Type 2 diabetes mellitus with hyperglycemia: Secondary | ICD-10-CM | POA: Diagnosis not present

## 2022-01-13 DIAGNOSIS — Z13220 Encounter for screening for lipoid disorders: Secondary | ICD-10-CM

## 2022-01-13 DIAGNOSIS — F17210 Nicotine dependence, cigarettes, uncomplicated: Secondary | ICD-10-CM

## 2022-01-13 LAB — POCT GLYCOSYLATED HEMOGLOBIN (HGB A1C): Hemoglobin A1C: 9.7 % — AB (ref 4.0–5.6)

## 2022-01-13 MED ORDER — METFORMIN HCL ER 500 MG PO TB24: 1000.0000 mg | ORAL_TABLET | Freq: Two times a day (BID) | ORAL | 2 refills | Status: DC

## 2022-01-13 MED ORDER — SITAGLIPTIN PHOSPHATE 25 MG PO TABS: 25.0000 mg | ORAL_TABLET | Freq: Every day | ORAL | 2 refills | Status: DC

## 2022-01-13 MED ORDER — LISINOPRIL 40 MG PO TABS: 40.0000 mg | ORAL_TABLET | Freq: Every day | ORAL | 2 refills | Status: DC

## 2022-01-13 MED ORDER — INSULIN GLARGINE-YFGN 100 UNIT/ML ~~LOC~~ SOPN: 15.0000 [IU] | PEN_INJECTOR | Freq: Every day | SUBCUTANEOUS | 0 refills | Status: DC

## 2022-01-13 NOTE — Progress Notes (Signed)
.  Pt presents for chronic care management  -pt received letter from insurance company that they will no longer be paying for Levemir, pt states they did not list alternatives

## 2022-01-15 ENCOUNTER — Telehealth: Payer: Self-pay | Admitting: Family

## 2022-01-15 NOTE — Telephone Encounter (Signed)
The patient called in stating the prescription for sitaGLIPtin (JANUVIA) 25 MG tablet is just too expensive even with insurance. The patient states it would be $536 dollars to get. She is very frustrated and upset and doesn't know what to do. Please assist patient further.

## 2022-01-16 ENCOUNTER — Ambulatory Visit: Payer: Self-pay | Admitting: *Deleted

## 2022-01-16 NOTE — Telephone Encounter (Signed)
Reason for Disposition  [1] Follow-up call to recent contact AND [2] information only call, no triage required  Answer Assessment - Initial Assessment Questions 1. REASON FOR CALL or QUESTION: "What is your reason for calling today?" or "How can I best help you?" or "What question do you have that I can help answer?"     Read the message to her from Any Minette Brine, NP dated 01/16/2022 at 8:31 AM. She wrote down the information plus I let her know it was on her MyChart if she needed to refer back to it.  Protocols used: Information Only Call - No Triage-A-AH

## 2022-01-16 NOTE — Telephone Encounter (Signed)
Left message on voicemail to return call.    Will inform patient of the message that PCP left Beginning 01/20/2022 increase Insulin Glargine (Semglee) from 15 units daily to 18 units daily. Metformin will remain the same. Hold Sitagliptin (Januvia) for now. Please provide patient with contact information to Endocrinology referral listed below. Thank you.    [facility] Address: [address] [place], [place] [address] Phone #: [phone]  :

## 2022-01-16 NOTE — Telephone Encounter (Signed)
Beginning 01/20/2022 increase Insulin Glargine (Semglee) from 15 units daily to 18 units daily. Metformin will remain the same. Hold Sitagliptin (Januvia) for now. Please provide patient with contact information to Endocrinology referral listed below. Thank you.   [facility] Address: [address] [place], [place] [address] Phone #: [phone]

## 2022-01-16 NOTE — Telephone Encounter (Signed)
  Chief Complaint: Read the message from Durene Fruits, NP to her dated 01/16/2022 at 8:31 AM. Symptoms: N/A Frequency: N/A Pertinent Negatives: Patient denies N/A Disposition: [] ED /[] Urgent Care (no appt availability in office) / [] Appointment(In office/virtual)/ []  [facility]/ [x] Home Care/ [] Refused Recommended Disposition /[] [facility]/ []  Follow-up with PCP Additional Notes: No questions from pt.

## 2022-01-19 NOTE — Telephone Encounter (Signed)
Unable to reach.  2nd attempt  Please relay message to patient-   Will inform patient of the message that PCP left Beginning 01/20/2022 increase Insulin Glargine (Semglee) from 15 units daily to 18 units daily. Metformin will remain the same. Hold Sitagliptin (Januvia) for now. Please provide patient with contact information to Endocrinology referral listed below. Thank you.    [facility] Address: [address] [place], [place] [address] Phone #: [phone]

## 2022-01-20 NOTE — Telephone Encounter (Signed)
Attempt 3rd time/attempt to reach patient to deliver message she left question about.  This is the only contact listed.

## 2022-01-27 ENCOUNTER — Other Ambulatory Visit: Payer: Self-pay | Admitting: Family

## 2022-01-27 DIAGNOSIS — Z794 Long term (current) use of insulin: Secondary | ICD-10-CM

## 2022-02-06 NOTE — Progress Notes (Unsigned)
Patient ID: MESCHELLE SASS, female    DOB: 01-11-1981  MRN: [id number]  CC: Chronic Care Management   Subjective: [name] [name] is a 42 y.o. female who presents for chronic care management.   Her concerns today include:  HTN - Increase Lisinopril from 20 mg daily to 40 mg daily DM - A1c 9.7% on 01/13/22, Metformin, Levemir discontinued due to no longer covered by patient's health insurance, Begin Insulin Glargine-YFGN, Sitagliptin, Patient intolerant to Dulaglutide due to side effect of nausea, Referral to Endocrinology   02/12/2022 appt with [facility]  10/20/22 appt with Endo   Beginning 01/20/2022 increase Insulin Glargine (Semglee) from 15 units daily to 18 units daily. Metformin will remain the same. Hold Sitagliptin (Januvia) for now. Please provide patient with contact information to Endocrinology referral listed below. Thank you.    [facility] Address: 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   Hypertensive retinopathy of both eyes 11/08/2018   Nuclear age-related cataract, both eyes 11/08/2018   MDD (major depressive disorder), recurrent episode, moderate (HCC) 08/04/2018   PTSD (post-traumatic stress disorder) 08/04/2018   MDD (major depressive disorder), severe (HCC) 07/12/2018   GERD (gastroesophageal reflux disease) 02/17/2018   Type 2 diabetes mellitus without complication, without long-term current use of insulin (HCC) 02/17/2018   S/P cesarean section 06/01/2014   S/P tubal ligation 06/01/2014   H/O cesarean section complicating pregnancy 04/18/2014   Emotional disorder 03/16/2013   Tobacco user 03/16/2013   Acute chest pain 03/01/2013   HTN (hypertension) 09/28/2012   DM (diabetes mellitus) (HCC) 11/06/2011     Current Outpatient Medications on File Prior to Visit  Medication Sig Dispense Refill   Blood Glucose Monitoring Suppl (ACCU-CHEK  GUIDE) w/Device KIT 1 each by Other route 4 (four) times daily -  before meals and at bedtime. 1 kit 0   gabapentin (NEURONTIN) 300 MG capsule TAKE 1 CAPSULE BY MOUTH THREE TIMES DAILY 270 capsule 0   glucose blood (ACCU-CHEK GUIDE) test strip Use as instructed 100 each 12   insulin glargine-yfgn (SEMGLEE) 100 UNIT/ML Pen Inject 15 Units into the skin at bedtime. 15 mL 0   Insulin Pen Needle (PEN NEEDLES) 31G X 8 MM MISC UAD 100 each 0   Lancets (ACCU-CHEK MULTICLIX) lancets Use as instructed 100 each 12   lisinopril (ZESTRIL) 40 MG tablet Take 1 tablet (40 mg total) by mouth daily. 30 tablet 2   metFORMIN (GLUCOPHAGE-XR) 500 MG 24 hr tablet Take 2 tablets (1,000 mg total) by mouth 2 (two) times daily with a meal. 120 tablet 2   nicotine (NICODERM CQ - DOSED IN MG/24 HOURS) 14 mg/24hr patch Place 1 patch (14 mg total) onto the skin daily. 28 patch 2   sitaGLIPtin (JANUVIA) 25 MG tablet Take 1 tablet (25 mg total) by mouth daily. 30 tablet 2   SUMAtriptan (IMITREX) 25 MG tablet Take 25 mg (1 tablet total) by mouth at the start of the headache. May repeat in 2 hours x 1 if headache persists. Max of 2 tablets/24 hours. 30 tablet 1   No current facility-administered medications on file prior to visit.    No Known Allergies  Social History   Socioeconomic History   Marital status: Legally Separated    Spouse name: Not on file   Number of children: Not on file   Years of education: Not  on file   Highest education level: Not on file  Occupational History   Not on file  Tobacco Use   Smoking status: Every Day    Packs/day: 2.00    Years: 12.00    Total pack years: 24.00    Types: Cigarettes    Passive exposure: Current   Smokeless tobacco: Never  Vaping Use   Vaping Use: Never used  Substance and Sexual Activity   Alcohol use: No   Drug use: No   Sexual activity: Yes    Birth control/protection: None  Other Topics Concern   Not on file  Social History Narrative   Not on file    Social Determinants of Health   Financial Resource Strain: Not on file  Food Insecurity: Not on file  Transportation Needs: Not on file  Physical Activity: Not on file  Stress: Not on file  Social Connections: Not on file  Intimate Partner Violence: Not on file    Family History  Problem Relation Age of Onset   Diabetes Maternal Grandmother    Diabetes Paternal Grandmother     Past Surgical History:  Procedure Laterality Date   CESAREAN SECTION     CESAREAN SECTION WITH BILATERAL TUBAL LIGATION Bilateral 06/01/2014   Procedure: CESAREAN SECTION WITH BILATERAL TUBAL LIGATION;  Surgeon: Sherian Rein, MD;  Location: [facility];  Service: Obstetrics;  Laterality: Bilateral;  MD requests RNFA   CHEST TUBE INSERTION     TONSILLECTOMY     TRACHEOSTOMY      ROS: Review of Systems Negative except as stated above  PHYSICAL EXAM: There were no vitals taken for this visit.  Physical Exam  {female adult master:310786} {female adult master:310785}     Latest Ref Rng & Units 10/09/2021    8:33 PM 08/06/2021    4:13 PM 10/18/2018    8:24 PM  CMP  Glucose 70 - 99 mg/dL 578  469  629   BUN 6 - 20 mg/dL 6  11  12    Creatinine 0.44 - 1.00 mg/dL 5.28  4.13  2.44   Sodium 135 - 145 mmol/L 137  137  140   Potassium 3.5 - 5.1 mmol/L 3.9  4.6  4.0   Chloride 98 - 111 mmol/L 105  100  105   CO2 22 - 32 mmol/L 18  21    Calcium 8.9 - 10.3 mg/dL 8.8  9.4    Total Protein 6.5 - 8.1 g/dL 6.3  6.4    Total Bilirubin 0.3 - 1.2 mg/dL 0.2  <0.1    Alkaline Phos 38 - 126 U/L 60  81    AST 15 - 41 U/L 13  9    ALT 0 - 44 U/L 13  9     Lipid Panel     Component Value Date/Time   CHOL 172 07/12/2018 1115   TRIG 251 (H) 07/12/2018 1115   HDL 31 (L) 07/12/2018 1115   CHOLHDL 5.5 07/12/2018 1115   VLDL 50 (H) 07/12/2018 1115   LDLCALC 91 07/12/2018 1115    CBC    Component Value Date/Time   WBC 10.6 (H) 10/09/2021 2033   RBC 4.82 10/09/2021 2033   HGB 13.9 10/09/2021 2033   HCT  40.9 10/09/2021 2033   PLT 330 10/09/2021 2033   MCV 84.9 10/09/2021 2033   MCH 28.8 10/09/2021 2033   MCHC 34.0 10/09/2021 2033   RDW 13.0 10/09/2021 2033   LYMPHSABS 3.8 10/09/2021 2033   MONOABS 0.6 10/09/2021 2033  EOSABS 0.1 10/09/2021 2033   BASOSABS 0.0 10/09/2021 2033    ASSESSMENT AND PLAN:  There are no diagnoses linked to this encounter.   Patient was given the opportunity to ask questions.  Patient verbalized understanding of the plan and was able to repeat key elements of the plan. Patient was given clear instructions to go to Emergency Department or return to medical center if symptoms don't improve, worsen, or new problems develop.The patient verbalized understanding.   No orders of the defined types were placed in this encounter.    Requested Prescriptions    No prescriptions requested or ordered in this encounter    No follow-ups on file.  Rema Fendt, NP

## 2022-02-07 ENCOUNTER — Other Ambulatory Visit: Payer: Self-pay | Admitting: Family

## 2022-02-07 DIAGNOSIS — I10 Essential (primary) hypertension: Secondary | ICD-10-CM

## 2022-02-09 ENCOUNTER — Encounter: Payer: Self-pay | Admitting: Family

## 2022-02-09 ENCOUNTER — Ambulatory Visit (INDEPENDENT_AMBULATORY_CARE_PROVIDER_SITE_OTHER): Payer: No Typology Code available for payment source | Admitting: Family

## 2022-02-09 VITALS — BP 128/84 | HR 81 | Ht 62.0 in | Wt 181.6 lb

## 2022-02-09 DIAGNOSIS — E1165 Type 2 diabetes mellitus with hyperglycemia: Secondary | ICD-10-CM

## 2022-02-09 DIAGNOSIS — I10 Essential (primary) hypertension: Secondary | ICD-10-CM

## 2022-02-09 LAB — POCT GLYCOSYLATED HEMOGLOBIN (HGB A1C): HbA1c, POC (controlled diabetic range): 9 % — AB (ref 0.0–7.0)

## 2022-02-09 NOTE — Patient Instructions (Signed)
Insulin Glargine Injection What is this medication? INSULIN GLARGINE (IN su lin GLAR geen) treats diabetes. It works by increasing insulin levels in your body, which decreases your blood sugar (glucose). It belongs to a group of medications called long-acting insulins or basal insulins. Changes to diet and exercise are often combined with this medication. This medicine may be used for other purposes; ask your health care provider or pharmacist if you have questions. COMMON BRAND NAME(S): BASAGLAR, Basaglar Tempo, Lantus, Lantus SoloStar, REZVOGLAR, Semglee, Toujeo Max SoloStar, [facility] What should I tell my care team before I take this medication? They need to know if you have any of these conditions: Episodes of low blood sugar Eye disease, vision problems Kidney disease Liver disease An unusual or allergic reaction to insulin, metacresol, other medications, foods, dyes, or preservatives Pregnant or trying to get pregnant Breast-feeding How should I use this medication? This medication is injected under the skin. You will be taught how to prepare and give it. Take it as directed on the prescription label at the same time every day. This insulin should never be mixed in the same syringe with other insulins before injection. Do not vigorously shake before use. You will be taught how to use this medication and how to adjust doses for activities and illness. Do not use more insulin than prescribed. Always check the appearance of your insulin before using it. This medication should be clear and colorless like water. Do not use it if it is cloudy, thickened, colored, or has solid particles in it. If you use an insulin pen, be sure to take off the outer needle cover before using the dose. It is important that you put your used needles and syringes in a special sharps container. Do not put them in a trash can. If you do not have a sharps container, call your pharmacist or care team to get  one. This medication comes with INSTRUCTIONS FOR USE. Ask your pharmacist for directions on how to use this medication. Read the information carefully. Talk to your pharmacist or care team if you have questions. Talk to your care team about the use of this medication in children. While it may be prescribed for children as young as 6 years for selected conditions, precautions do apply. Overdosage: If you think you have taken too much of this medicine contact a poison control center or emergency room at once. NOTE: This medicine is only for you. Do not share this medicine with others. What if I miss a dose? It is important not to miss a dose. Your care team should discuss a plan for missed doses with you. If you do miss a dose, follow their plan. Do not take double doses. What may interact with this medication? Alcohol Antivirals for HIV or AIDS Aspirin and aspirin-like medications Beta blockers, such as atenolol, metoprolol, propranolol Certain medications for blood pressure, heart disease, irregular heartbeat Chromium Clonidine Diuretics Estrogen or progestin hormones Fenofibrate Gemfibrozil Guanethidine Isoniazid Lanreotide MAOIs, such as Carbex, Eldepryl, Marplan, Nardil, and Parnate Medications for weight loss Medications for allergies, asthma, cold, or cough Medications for mental health conditions Niacin Nicotine NSAIDs, medications for pain and inflammation, such as ibuprofen or naproxen Octreotide Other medications for diabetes, such as glyburide, glipizide, or glimepiride Pasireotide Pentamidine Phenytoin Probenecid Quinolone antibiotics, such as ciprofloxacin, levofloxacin, ofloxacin Reserpine Some herbal dietary supplements Steroid medications, such as prednisone or cortisone Sulfamethoxazole; trimethoprim Testosterone or anabolic steroids Thyroid hormones This list may not describe all possible interactions. Give your  health care provider a list of all the  medicines, herbs, non-prescription drugs, or dietary supplements you use. Also tell them if you smoke, drink alcohol, or use illegal drugs. Some items may interact with your medicine. What should I watch for while using this medication? Visit your care team for regular checks on your progress. Your care team will monitor your hemoglobin A1C. This is a simple blood test. It measures your average blood sugar level over the past 3 months. It will help you and your care team manage your diabetes. Learn how to check your blood sugar levels. Know the symptoms of low and high blood sugar and how to manage them. Always carry a source of quick sugar with you for symptoms of low blood sugar. Examples include glucose tablets, juice, or sugar candy. Teach your family members, friends, and others how to help you if your blood sugar is too low and you are not awake enough to treat it. Talk to your care team if you have high blood sugar. You may need to adjust your insulin dose. Many factors can cause high blood sugar, including illness, stress, or a change in activity. Do not skip meals. Ask your care team if you should avoid alcohol. Many cough and cold products contain sugar or alcohol. These can affect blood sugar levels. Make sure that you have the correct syringe for the type of insulin you use. Do not change the brand or type of insulin or syringe unless your care team tells you to. Switching insulin brand or type can affect your blood sugar enough to cause serious adverse effects. Always keep an extra supply of insulin and related supplies on hand. Only use syringes once. Get rid of syringes and needles in a closed container to prevent accidental needle sticks. Do not share insulin pens or cartridges with anyone, even if the needle is changed. Each pen should only be used by one person. Sharing could cause an infection. Do not use a syringe to take insulin out of an insulin pen. Doing this may result in the  wrong dose of insulin. Wear a medical ID bracelet or chain. Carry a card that describes your condition. List the medications and doses you take on the card. What side effects may I notice from receiving this medication? Side effects that you should report to your care team as soon as possible: Allergic reactions--skin rash, itching, hives, swelling of the face, lips, tongue, or throat Low blood sugar (hypoglycemia)--tremors or shaking, anxiety, sweating, cold or clammy skin, confusion, dizziness, rapid heartbeat Low potassium level--muscle pain or cramps, unusual weakness or fatigue, fast or irregular heartbeat, constipation Side effects that usually do not require medical attention (report to your care team if they continue or are bothersome): Lipodystrophy--hardening or scarring of tissue at injection site Pain, redness, or irritation at injection site Weight gain This list may not describe all possible side effects. Call your doctor for medical advice about side effects. You may report side effects to FDA at 1-800-FDA-1088. Where should I keep my medication? Keep out of the reach of children and pets. Unopened Vials: Lantus vials: Store in a refrigerator between 2 and 8 degrees C (36 and 46 degrees F) or at room temperature below 30 degrees C (86 degrees F). Do not freeze or use if the insulin has been frozen. Protect from light and excessive heat. If stored at room temperature, the vial must be discarded after 28 days. Throw away any unopened and unused medication that has  been stored in the refrigerator after the expiration date. Unopened Pens: Neurosurgeon: Store in a refrigerator between 2 and 8 degrees C (36 and 46 degrees F) or at room temperature below 30 degrees C (86 degrees F). Do not freeze or use if the insulin has been frozen. Protect from light and excessive heat. If stored at room temperature, the pen must be discarded after 28 days. Throw away any unopened and unused  medication that has been stored in the refrigerator after the expiration date. Lantus Solostar Pens: Store in a refrigerator between 2 and 8 degrees C (36 and 46 degrees F) or at room temperature below 30 degrees C (86 degrees F). Do not freeze or use if the insulin has been frozen. Protect from light and excessive heat. If stored at room temperature, the pen must be discarded after 28 days. Throw away any unopened and unused medication that has been stored in the refrigerator after the expiration date. Semglee Pens: Store in a refrigerator between 2 and 8 degrees C (36 and 46 degrees F) or at room temperature below 30 degrees C (86 degrees F). Do not freeze or use if the insulin has been frozen. Protect from light and excessive heat. If stored at room temperature, the pen must be discarded after 28 days. Throw away any unopened and unused medication that has been stored in the refrigerator after the expiration date. Toujeo Solostar Pens or Toujeo Max [employer] Pens: Store in a refrigerator between 2 and 8 degrees C (36 and 46 degrees F). Do not freeze or use if the insulin has been frozen. Protect from light and excessive heat. Throw away any unopened and unused medication that has been stored in the refrigerator after the expiration date. Vials that you are using: Lantus vials: Store in a refrigerator or at room temperature below 30 degrees C (86 degrees F). Do not freeze. Keep away from heat and light. Throw the opened vial away after 28 days. Semglee vials: Store in a refrigerator or at room temperature below 30 degrees C (86 degrees F). Do not freeze. Keep away from heat and light. Throw the opened vial away after 28 days. Pens that you are using: Basaglar KwikPens: Store at room temperature below 30 degrees C (86 degrees F). Do not refrigerate or freeze. Keep away from heat and light. Throw the pen away after 28 days, even if it still has insulin left in it. Lantus Solostar Pens: Store at room  temperature below 30 degrees C (86 degrees F). Do not refrigerate or freeze. Keep away from heat and light. Throw the pen away after 28 days, even if it still has insulin left in it. Semglee Pens: Store at room temperature below 30 degrees C (86 degrees F). Do not refrigerate or freeze. Keep away from heat and light. Throw the pen away after 28 days, even if it still has insulin left in it. Toujeo Solostar Pens or Toujeo Max [employer] Pens: Store at room temperature below 30 degrees C (86 degrees F). Do not refrigerate or freeze. Keep away from heat and light. Throw the pen away after 56 days, even if it still has insulin left in it. NOTE: This sheet is a summary. It may not cover all possible information. If you have questions about this medicine, talk to your doctor, pharmacist, or health care provider.  2023 Elsevier/Gold Standard (2020-02-02 00:00:00)

## 2022-02-09 NOTE — Telephone Encounter (Signed)
Unable to refill per protocol, Rx request is too soon. Last refill 01/13/22 for 30 and 3 refills.  Requested Prescriptions  Pending Prescriptions Disp Refills   lisinopril (ZESTRIL) 10 MG tablet [Pharmacy Med Name: Lisinopril 10 MG Oral Tablet] 30 tablet 0    Sig: Take 1 tablet by mouth once daily     Cardiovascular:  ACE Inhibitors Failed - 02/07/2022  6:50 AM      Failed - Last BP in normal range    BP Readings from Last 1 Encounters:  02/09/22 128/84         Passed - Cr in normal range and within 180 days    Creatinine, Ser  Date Value Ref Range Status  10/09/2021 0.56 0.44 - 1.00 mg/dL Final   Creatinine, Urine  Date Value Ref Range Status  05/28/2014 56.00 mg/dL Final         Passed - K in normal range and within 180 days    Potassium  Date Value Ref Range Status  10/09/2021 3.9 3.5 - 5.1 mmol/L Final         Passed - Patient is not pregnant      Passed - Valid encounter within last 6 months    Recent Outpatient Visits           Today Primary hypertension   [facility], [place], NP   3 weeks ago Primary hypertension   [facility], [place], NP   3 months ago Primary hypertension   [facility], Amy J, NP   4 months ago Type 2 diabetes mellitus with diabetic mononeuropathy, with long-term current use of insulin ([place])   [facility], Amy J, NP   5 months ago Type 2 diabetes mellitus with diabetic mononeuropathy, with long-term current use of insulin [facility])   [facility], [place], NP       Future Appointments             In 3 days Daisy Blossom, Jarome Matin, [place]   In 9 months Shamleffer, Melanie Crazier, MD [facility]

## 2022-02-11 NOTE — Progress Notes (Deleted)
S:     PCP: Amy Minette Brine  42 y.o. female who presents for diabetes evaluation, education, and management.  PMH is significant for HTN, GERD, T2DM, tobacco use disorder, MDD, PTSD.  Patient was referred and last seen by Primary Care Provider, Durene Fruits on 02/09/2022.   At last visit, A1c was above goal at 9%, but improving. Insurance would not cover Januvia and the patient has GI intolerance to Trulicity.  Semglee dose was increased at that time.  Today, patient arrives in *** good spirits and presents without *** any assistance. ***  Patient reports Diabetes was diagnosed in ***.   Family/Social History:  -Fhx: DM -Tobacco: 2 ppd -Alcohol: denies  Current diabetes medications include: metformin '1000mg'$  BID, Semglee 15 units daily  Patient reports adherence to taking all medications as prescribed.  *** Patient denies adherence with medications, reports missing *** medications *** times per week, on average.  Insurance coverage: [employer]  Patient {Actions; denies-reports:120008} hypoglycemic events.  Reported home fasting blood sugars: ***  Reported 2 hour post-meal/random blood sugars: ***.  Patient {Actions; denies-reports:120008} nocturia (nighttime urination).  Patient {Actions; denies-reports:120008} neuropathy (nerve pain). Patient {Actions; denies-reports:120008} visual changes. Patient {Actions; denies-reports:120008} self foot exams.   Patient reported dietary habits: Eats *** meals/day Breakfast: *** Lunch: *** Dinner: *** Snacks: *** Drinks: ***   Patient-reported exercise habits: ***   O:   ROS  Physical Exam  7 day average blood glucose: ***  *** CGM Download:  % Time CGM is active: ***% Average Glucose: *** mg/dL Glucose Management Indicator: ***  Glucose Variability: *** (goal <36%) Time in Goal:  - Time in range 70-180: ***% - Time above range: ***% - Time below range: ***% Observed patterns:   Lab Results  Component Value Date    HGBA1C 9.0 (A) 02/09/2022   There were no vitals filed for this visit.  Lipid Panel     Component Value Date/Time   CHOL 172 07/12/2018 1115   TRIG 251 (H) 07/12/2018 1115   HDL 31 (L) 07/12/2018 1115   CHOLHDL 5.5 07/12/2018 1115   VLDL 50 (H) 07/12/2018 1115   LDLCALC 91 07/12/2018 1115    Clinical Atherosclerotic Cardiovascular Disease (ASCVD): {YES/NO:21197} The 10-year ASCVD risk score (Arnett DK, et al., 2019) is: 11%   Values used to calculate the score:     Age: 35 years     Sex: Female     Is Non-Hispanic African American: No     Diabetic: Yes     Tobacco smoker: Yes     Systolic Blood Pressure: 0000000 mmHg     Is BP treated: Yes     HDL Cholesterol: 30 MG/DL     Total Cholesterol: 151 MG/DL   Patient is participating in a Managed Medicaid Plan:  {MM YES/NO:27447::"Yes"}   A/P: Diabetes longstanding *** currently ***. Patient is *** able to verbalize appropriate hypoglycemia management plan. Medication adherence appears ***. Control is suboptimal due to ***. -{Meds adjust:18428} basal insulin *** (insulin ***). Patient will continue to titrate 1 unit every *** days if fasting blood sugar > '100mg'$ /dl until fasting blood sugars reach goal or next visit.  -{Meds adjust:18428} rapid insulin *** (insulin ***) to ***.  -{Meds adjust:18428} GLP-1 *** (generic ***) to ***.  -{Meds adjust:18428} SGLT2-I *** (generic ***) to ***. Counseled on sick day rules. -{Meds adjust:18428} metformin *** to ***.  -Patient educated on purpose, proper use, and potential adverse effects of ***.  -Extensively discussed pathophysiology of diabetes, recommended lifestyle  interventions, dietary effects on blood sugar control.  -Counseled on s/sx of and management of hypoglycemia.  -Next A1c anticipated 05/2022.   Written patient instructions provided. Patient verbalized understanding of treatment plan.  Total time in face to face counseling *** minutes.    Follow-up:  Endocrinology  10/20/2022 Pharmacist ***.  Maryan Puls, PharmD PGY-1 [facility] Resident

## 2022-02-12 ENCOUNTER — Ambulatory Visit: Payer: No Typology Code available for payment source | Admitting: Pharmacist

## 2022-04-06 ENCOUNTER — Other Ambulatory Visit: Payer: Self-pay | Admitting: Family

## 2022-04-06 DIAGNOSIS — I10 Essential (primary) hypertension: Secondary | ICD-10-CM

## 2022-04-06 DIAGNOSIS — E1165 Type 2 diabetes mellitus with hyperglycemia: Secondary | ICD-10-CM

## 2022-04-20 ENCOUNTER — Other Ambulatory Visit: Payer: Self-pay | Admitting: Family

## 2022-04-20 DIAGNOSIS — E1165 Type 2 diabetes mellitus with hyperglycemia: Secondary | ICD-10-CM

## 2022-04-20 NOTE — Telephone Encounter (Signed)
Complete

## 2022-05-05 ENCOUNTER — Other Ambulatory Visit: Payer: Self-pay | Admitting: Family

## 2022-05-05 DIAGNOSIS — I10 Essential (primary) hypertension: Secondary | ICD-10-CM

## 2022-05-05 DIAGNOSIS — E1165 Type 2 diabetes mellitus with hyperglycemia: Secondary | ICD-10-CM

## 2022-05-05 NOTE — Telephone Encounter (Signed)
Complete

## 2022-05-07 ENCOUNTER — Emergency Department (HOSPITAL_COMMUNITY): Payer: No Typology Code available for payment source | Admitting: Certified Registered Nurse Anesthetist

## 2022-05-07 ENCOUNTER — Emergency Department (HOSPITAL_COMMUNITY): Payer: No Typology Code available for payment source

## 2022-05-07 ENCOUNTER — Encounter (HOSPITAL_COMMUNITY): Payer: Self-pay | Admitting: Radiology

## 2022-05-07 ENCOUNTER — Encounter (HOSPITAL_COMMUNITY)
Admission: EM | Disposition: A | Discharge status: Home / Self Care | Payer: Self-pay | Source: Home / Self Care | Attending: Vascular Surgery

## 2022-05-07 ENCOUNTER — Other Ambulatory Visit: Payer: Self-pay

## 2022-05-07 ENCOUNTER — Inpatient Hospital Stay (HOSPITAL_COMMUNITY)
Admission: EM | Admit: 2022-05-07 | Discharge: 2022-05-09 | DRG: 272 | Disposition: A | Discharge status: Home / Self Care | Payer: No Typology Code available for payment source | Attending: Vascular Surgery | Admitting: Vascular Surgery

## 2022-05-07 DIAGNOSIS — I998 Other disorder of circulatory system: Secondary | ICD-10-CM | POA: Diagnosis not present

## 2022-05-07 DIAGNOSIS — Z6833 Body mass index (BMI) 33.0-33.9, adult: Secondary | ICD-10-CM

## 2022-05-07 DIAGNOSIS — Z98891 History of uterine scar from previous surgery: Secondary | ICD-10-CM | POA: Diagnosis not present

## 2022-05-07 DIAGNOSIS — I70221 Atherosclerosis of native arteries of extremities with rest pain, right leg: Secondary | ICD-10-CM | POA: Diagnosis present

## 2022-05-07 DIAGNOSIS — F32A Depression, unspecified: Secondary | ICD-10-CM | POA: Diagnosis present

## 2022-05-07 DIAGNOSIS — K219 Gastro-esophageal reflux disease without esophagitis: Secondary | ICD-10-CM | POA: Diagnosis present

## 2022-05-07 DIAGNOSIS — I10 Essential (primary) hypertension: Secondary | ICD-10-CM

## 2022-05-07 DIAGNOSIS — Z95828 Presence of other vascular implants and grafts: Secondary | ICD-10-CM

## 2022-05-07 DIAGNOSIS — K760 Fatty (change of) liver, not elsewhere classified: Secondary | ICD-10-CM | POA: Diagnosis present

## 2022-05-07 DIAGNOSIS — I743 Embolism and thrombosis of arteries of the lower extremities: Principal | ICD-10-CM | POA: Diagnosis present

## 2022-05-07 DIAGNOSIS — R7402 Elevation of levels of lactic acid dehydrogenase (LDH): Secondary | ICD-10-CM | POA: Diagnosis present

## 2022-05-07 DIAGNOSIS — E1151 Type 2 diabetes mellitus with diabetic peripheral angiopathy without gangrene: Principal | ICD-10-CM | POA: Diagnosis present

## 2022-05-07 DIAGNOSIS — Z833 Family history of diabetes mellitus: Secondary | ICD-10-CM | POA: Diagnosis not present

## 2022-05-07 DIAGNOSIS — Z79899 Other long term (current) drug therapy: Secondary | ICD-10-CM | POA: Diagnosis not present

## 2022-05-07 DIAGNOSIS — Z7984 Long term (current) use of oral hypoglycemic drugs: Secondary | ICD-10-CM | POA: Diagnosis not present

## 2022-05-07 DIAGNOSIS — M7989 Other specified soft tissue disorders: Secondary | ICD-10-CM

## 2022-05-07 DIAGNOSIS — E669 Obesity, unspecified: Secondary | ICD-10-CM | POA: Diagnosis present

## 2022-05-07 DIAGNOSIS — D72829 Elevated white blood cell count, unspecified: Secondary | ICD-10-CM | POA: Diagnosis present

## 2022-05-07 DIAGNOSIS — F419 Anxiety disorder, unspecified: Secondary | ICD-10-CM | POA: Diagnosis present

## 2022-05-07 DIAGNOSIS — F17210 Nicotine dependence, cigarettes, uncomplicated: Secondary | ICD-10-CM

## 2022-05-07 DIAGNOSIS — I2609 Other pulmonary embolism with acute cor pulmonale: Secondary | ICD-10-CM | POA: Diagnosis not present

## 2022-05-07 DIAGNOSIS — R531 Weakness: Secondary | ICD-10-CM

## 2022-05-07 HISTORY — PX: FEMORAL-POPLITEAL BYPASS GRAFT: SHX937

## 2022-05-07 LAB — SURGICAL PCR SCREEN
MRSA, PCR: NEGATIVE
Staphylococcus aureus: NEGATIVE

## 2022-05-07 LAB — GLUCOSE, CAPILLARY
Glucose-Capillary: 151 mg/dL — ABNORMAL HIGH (ref 70–99)
Glucose-Capillary: 222 mg/dL — ABNORMAL HIGH (ref 70–99)
Glucose-Capillary: 224 mg/dL — ABNORMAL HIGH (ref 70–99)

## 2022-05-07 LAB — CBC WITH DIFFERENTIAL/PLATELET
Abs Immature Granulocytes: 0.05 10*3/uL (ref 0.00–0.07)
Basophils Absolute: 0 10*3/uL (ref 0.0–0.1)
Basophils Relative: 0 %
Eosinophils Absolute: 0.1 10*3/uL (ref 0.0–0.5)
Eosinophils Relative: 1 %
HCT: 44.4 % (ref 36.0–46.0)
Hemoglobin: 14.7 g/dL (ref 12.0–15.0)
Immature Granulocytes: 0 %
Lymphocytes Relative: 22 %
Lymphs Abs: 2.9 10*3/uL (ref 0.7–4.0)
MCH: 28.9 pg (ref 26.0–34.0)
MCHC: 33.1 g/dL (ref 30.0–36.0)
MCV: 87.2 fL (ref 80.0–100.0)
Monocytes Absolute: 0.7 10*3/uL (ref 0.1–1.0)
Monocytes Relative: 5 %
Neutro Abs: 9.6 10*3/uL — ABNORMAL HIGH (ref 1.7–7.7)
Neutrophils Relative %: 72 %
Platelets: 267 10*3/uL (ref 150–400)
RBC: 5.09 MIL/uL (ref 3.87–5.11)
RDW: 13.2 % (ref 11.5–15.5)
WBC: 13.3 10*3/uL — ABNORMAL HIGH (ref 4.0–10.5)
nRBC: 0 % (ref 0.0–0.2)

## 2022-05-07 LAB — BASIC METABOLIC PANEL
Anion gap: 12 (ref 5–15)
BUN: 14 mg/dL (ref 6–20)
CO2: 19 mmol/L — ABNORMAL LOW (ref 22–32)
Calcium: 8.5 mg/dL — ABNORMAL LOW (ref 8.9–10.3)
Chloride: 104 mmol/L (ref 98–111)
Creatinine, Ser: 0.58 mg/dL (ref 0.44–1.00)
GFR, Estimated: >60 mL/min (ref >60)
Glucose, Bld: 288 mg/dL — ABNORMAL HIGH (ref 70–99)
Potassium: 4.5 mmol/L (ref 3.5–5.1)
Sodium: 135 mmol/L (ref 135–145)

## 2022-05-07 LAB — TYPE AND SCREEN
ABO/RH(D): O POS
Antibody Screen: NEGATIVE

## 2022-05-07 LAB — CK: Total CK: 69 U/L (ref 38–234)

## 2022-05-07 LAB — LACTIC ACID, PLASMA
Lactic Acid, Venous: 2.2 mmol/L (ref 0.5–1.9)
Lactic Acid, Venous: 1.5 mmol/L (ref 0.5–1.9)

## 2022-05-07 SURGERY — BYPASS GRAFT FEMORAL-POPLITEAL ARTERY
Anesthesia: General | Site: Leg Lower | Laterality: Right

## 2022-05-07 MED ORDER — HEPARIN SODIUM (PORCINE) 1000 UNIT/ML IJ SOLN
INTRAMUSCULAR | Status: AC
  Filled 2022-05-07: qty 20

## 2022-05-07 MED ORDER — LIDOCAINE 2% (20 MG/ML) 5 ML SYRINGE
INTRAMUSCULAR | Status: AC
  Filled 2022-05-07: qty 5

## 2022-05-07 MED ORDER — INSULIN ASPART 100 UNIT/ML IJ SOLN
INTRAMUSCULAR | Status: AC
  Administered 2022-05-07: 2 [IU] via SUBCUTANEOUS
  Filled 2022-05-07: qty 1

## 2022-05-07 MED ORDER — PROMETHAZINE HCL 25 MG/ML IJ SOLN: 6.2500 mg | INTRAMUSCULAR | Status: DC | PRN

## 2022-05-07 MED ORDER — OXYCODONE-ACETAMINOPHEN 5-325 MG PO TABS
1.0000 | ORAL_TABLET | ORAL | Status: DC | PRN
  Administered 2022-05-07: 2 via ORAL
  Administered 2022-05-08: 1 via ORAL
  Administered 2022-05-08 – 2022-05-09 (×4): 2 via ORAL
  Filled 2022-05-07 (×2): qty 2
  Filled 2022-05-07: qty 1
  Filled 2022-05-07 (×4): qty 2

## 2022-05-07 MED ORDER — CHLORHEXIDINE GLUCONATE 0.12 % MT SOLN
OROMUCOSAL | Status: AC
  Administered 2022-05-07: 15 mL via OROMUCOSAL
  Filled 2022-05-07: qty 15

## 2022-05-07 MED ORDER — ONDANSETRON HCL 4 MG/2ML IJ SOLN
4.0000 mg | Freq: Once | INTRAMUSCULAR | Status: AC
  Administered 2022-05-07: 4 mg via INTRAVENOUS
  Filled 2022-05-07: qty 2

## 2022-05-07 MED ORDER — PROPOFOL 500 MG/50ML IV EMUL
INTRAVENOUS | Status: DC | PRN
  Administered 2022-05-07: 25 ug/kg/min via INTRAVENOUS

## 2022-05-07 MED ORDER — HEPARIN 6000 UNIT IRRIGATION SOLUTION
Status: AC
  Filled 2022-05-07: qty 500

## 2022-05-07 MED ORDER — MIDAZOLAM HCL 2 MG/2ML IJ SOLN
INTRAMUSCULAR | Status: DC | PRN
  Administered 2022-05-07: 2 mg via INTRAVENOUS

## 2022-05-07 MED ORDER — INSULIN ASPART 100 UNIT/ML IJ SOLN
0.0000 [IU] | Freq: Three times a day (TID) | INTRAMUSCULAR | Status: DC
  Administered 2022-05-08: 5 [IU] via SUBCUTANEOUS
  Administered 2022-05-08: 8 [IU] via SUBCUTANEOUS
  Administered 2022-05-08 – 2022-05-09 (×2): 3 [IU] via SUBCUTANEOUS

## 2022-05-07 MED ORDER — CALCIUM CHLORIDE 10 % IV SOLN
INTRAVENOUS | Status: AC
  Filled 2022-05-07: qty 10

## 2022-05-07 MED ORDER — MEPERIDINE HCL 25 MG/ML IJ SOLN: 6.2500 mg | INTRAMUSCULAR | Status: DC | PRN

## 2022-05-07 MED ORDER — SUGAMMADEX SODIUM 200 MG/2ML IV SOLN
INTRAVENOUS | Status: DC | PRN
  Administered 2022-05-07: 162.4 mg via INTRAVENOUS

## 2022-05-07 MED ORDER — ACETAMINOPHEN 10 MG/ML IV SOLN
INTRAVENOUS | Status: AC
  Filled 2022-05-07: qty 100

## 2022-05-07 MED ORDER — PHENYLEPHRINE HCL-NACL 20-0.9 MG/250ML-% IV SOLN
INTRAVENOUS | Status: DC | PRN
  Administered 2022-05-07: 30 ug/min via INTRAVENOUS

## 2022-05-07 MED ORDER — HYDROMORPHONE HCL 1 MG/ML IJ SOLN
0.2500 mg | INTRAMUSCULAR | Status: DC | PRN
  Administered 2022-05-07: 0.25 mg via INTRAVENOUS
  Administered 2022-05-07 (×2): 0.5 mg via INTRAVENOUS
  Administered 2022-05-07: 0.25 mg via INTRAVENOUS

## 2022-05-07 MED ORDER — DIAZEPAM 5 MG/ML IJ SOLN
5.0000 mg | Freq: Once | INTRAMUSCULAR | Status: AC
  Administered 2022-05-07: 5 mg via INTRAVENOUS
  Filled 2022-05-07: qty 2

## 2022-05-07 MED ORDER — AMISULPRIDE (ANTIEMETIC) 5 MG/2ML IV SOLN: 10.0000 mg | Freq: Once | INTRAVENOUS | Status: DC | PRN

## 2022-05-07 MED ORDER — SODIUM CHLORIDE 0.9 % IV BOLUS
1000.0000 mL | Freq: Once | INTRAVENOUS | Status: AC
  Administered 2022-05-07: 1000 mL via INTRAVENOUS

## 2022-05-07 MED ORDER — HYDROMORPHONE HCL 1 MG/ML IJ SOLN
1.0000 mg | Freq: Once | INTRAMUSCULAR | Status: AC
  Administered 2022-05-07: 1 mg via INTRAVENOUS
  Filled 2022-05-07: qty 1

## 2022-05-07 MED ORDER — SUCCINYLCHOLINE CHLORIDE 200 MG/10ML IV SOSY
PREFILLED_SYRINGE | INTRAVENOUS | Status: AC
  Filled 2022-05-07: qty 10

## 2022-05-07 MED ORDER — HEMOSTATIC AGENTS (NO CHARGE) OPTIME
TOPICAL | Status: DC | PRN
  Administered 2022-05-07: 1 via TOPICAL

## 2022-05-07 MED ORDER — HYDROMORPHONE HCL 1 MG/ML IJ SOLN
INTRAMUSCULAR | Status: AC
  Filled 2022-05-07: qty 1

## 2022-05-07 MED ORDER — PROTAMINE SULFATE 10 MG/ML IV SOLN
INTRAVENOUS | Status: AC
  Filled 2022-05-07: qty 5

## 2022-05-07 MED ORDER — LIDOCAINE 2% (20 MG/ML) 5 ML SYRINGE
INTRAMUSCULAR | Status: DC | PRN
  Administered 2022-05-07: 60 mg via INTRAVENOUS

## 2022-05-07 MED ORDER — FENTANYL CITRATE (PF) 250 MCG/5ML IJ SOLN
INTRAMUSCULAR | Status: AC
  Filled 2022-05-07: qty 5

## 2022-05-07 MED ORDER — MIDAZOLAM HCL 2 MG/2ML IJ SOLN
INTRAMUSCULAR | Status: AC
  Filled 2022-05-07: qty 2

## 2022-05-07 MED ORDER — ORAL CARE MOUTH RINSE: 15.0000 mL | Freq: Once | OROMUCOSAL | Status: AC

## 2022-05-07 MED ORDER — MIDAZOLAM HCL (PF) 10 MG/2ML IJ SOLN
INTRAMUSCULAR | Status: AC
  Filled 2022-05-07: qty 2

## 2022-05-07 MED ORDER — HEPARIN SODIUM (PORCINE) 1000 UNIT/ML IJ SOLN
INTRAMUSCULAR | Status: AC
  Filled 2022-05-07: qty 1

## 2022-05-07 MED ORDER — GABAPENTIN 300 MG PO CAPS
300.0000 mg | ORAL_CAPSULE | Freq: Three times a day (TID) | ORAL | Status: DC
  Administered 2022-05-07 – 2022-05-09 (×5): 300 mg via ORAL
  Filled 2022-05-07 (×5): qty 1

## 2022-05-07 MED ORDER — ACETAMINOPHEN 10 MG/ML IV SOLN
INTRAVENOUS | Status: DC | PRN
  Administered 2022-05-07: 1000 mg via INTRAVENOUS

## 2022-05-07 MED ORDER — LACTATED RINGERS IV SOLN: INTRAVENOUS | Status: DC

## 2022-05-07 MED ORDER — PAPAVERINE HCL 30 MG/ML IJ SOLN
INTRAMUSCULAR | Status: AC
  Filled 2022-05-07: qty 2

## 2022-05-07 MED ORDER — PHENYLEPHRINE 80 MCG/ML (10ML) SYRINGE FOR IV PUSH (FOR BLOOD PRESSURE SUPPORT)
PREFILLED_SYRINGE | INTRAVENOUS | Status: DC | PRN
  Administered 2022-05-07: 80 ug via INTRAVENOUS

## 2022-05-07 MED ORDER — SURGIFLO WITH THROMBIN (HEMOSTATIC MATRIX KIT) OPTIME
TOPICAL | Status: DC | PRN
  Administered 2022-05-07: 1 via TOPICAL

## 2022-05-07 MED ORDER — LIDOCAINE 2% (20 MG/ML) 5 ML SYRINGE
INTRAMUSCULAR | Status: AC
  Filled 2022-05-07: qty 25

## 2022-05-07 MED ORDER — 0.9 % SODIUM CHLORIDE (POUR BTL) OPTIME
TOPICAL | Status: DC | PRN
  Administered 2022-05-07: 2000 mL

## 2022-05-07 MED ORDER — HEPARIN 6000 UNIT IRRIGATION SOLUTION
Status: DC | PRN
  Administered 2022-05-07: 1

## 2022-05-07 MED ORDER — INSULIN ASPART 100 UNIT/ML IJ SOLN: 0.0000 [IU] | INTRAMUSCULAR | Status: DC | PRN

## 2022-05-07 MED ORDER — HEPARIN BOLUS VIA INFUSION
4500.0000 [IU] | Freq: Once | INTRAVENOUS | Status: AC
  Administered 2022-05-07: 4500 [IU] via INTRAVENOUS
  Filled 2022-05-07: qty 4500

## 2022-05-07 MED ORDER — CEFAZOLIN SODIUM-DEXTROSE 2-3 GM-%(50ML) IV SOLR
INTRAVENOUS | Status: DC | PRN
  Administered 2022-05-07: 2 g via INTRAVENOUS

## 2022-05-07 MED ORDER — CEFAZOLIN SODIUM-DEXTROSE 2-4 GM/100ML-% IV SOLN
INTRAVENOUS | Status: AC
  Filled 2022-05-07: qty 100

## 2022-05-07 MED ORDER — MORPHINE SULFATE (PF) 4 MG/ML IV SOLN
4.0000 mg | Freq: Once | INTRAVENOUS | Status: AC
  Administered 2022-05-07: 4 mg via INTRAVENOUS
  Filled 2022-05-07: qty 1

## 2022-05-07 MED ORDER — ROCURONIUM BROMIDE 10 MG/ML (PF) SYRINGE
PREFILLED_SYRINGE | INTRAVENOUS | Status: DC | PRN
  Administered 2022-05-07: 60 mg via INTRAVENOUS
  Administered 2022-05-07 (×2): 20 mg via INTRAVENOUS

## 2022-05-07 MED ORDER — CHLORHEXIDINE GLUCONATE 0.12 % MT SOLN: 15.0000 mL | Freq: Once | OROMUCOSAL | Status: AC

## 2022-05-07 MED ORDER — FENTANYL CITRATE (PF) 250 MCG/5ML IJ SOLN
INTRAMUSCULAR | Status: DC | PRN
  Administered 2022-05-07: 50 ug via INTRAVENOUS
  Administered 2022-05-07: 100 ug via INTRAVENOUS
  Administered 2022-05-07: 50 ug via INTRAVENOUS

## 2022-05-07 MED ORDER — ONDANSETRON HCL 4 MG/2ML IJ SOLN
INTRAMUSCULAR | Status: DC | PRN
  Administered 2022-05-07: 4 mg via INTRAVENOUS

## 2022-05-07 MED ORDER — IOHEXOL 350 MG/ML SOLN
100.0000 mL | Freq: Once | INTRAVENOUS | Status: AC | PRN
  Administered 2022-05-07: 100 mL via INTRAVENOUS

## 2022-05-07 MED ORDER — PROPOFOL 10 MG/ML IV BOLUS
INTRAVENOUS | Status: AC
  Filled 2022-05-07: qty 20

## 2022-05-07 MED ORDER — ATROPINE SULFATE 0.4 MG/ML IV SOLN
INTRAVENOUS | Status: AC
  Filled 2022-05-07: qty 1

## 2022-05-07 MED ORDER — HEPARIN (PORCINE) 25000 UT/250ML-% IV SOLN
1200.0000 [IU]/h | INTRAVENOUS | Status: DC
  Administered 2022-05-07 (×2): 1200 [IU]/h via INTRAVENOUS
  Filled 2022-05-07: qty 250

## 2022-05-07 MED ORDER — HEPARIN SODIUM (PORCINE) 1000 UNIT/ML IJ SOLN
INTRAMUSCULAR | Status: DC | PRN
  Administered 2022-05-07: 2000 [IU] via INTRAVENOUS
  Administered 2022-05-07: 8000 [IU] via INTRAVENOUS

## 2022-05-07 MED ORDER — GLYCOPYRROLATE PF 0.2 MG/ML IJ SOSY
PREFILLED_SYRINGE | INTRAMUSCULAR | Status: AC
  Filled 2022-05-07: qty 1

## 2022-05-07 MED ORDER — ROCURONIUM BROMIDE 10 MG/ML (PF) SYRINGE
PREFILLED_SYRINGE | INTRAVENOUS | Status: AC
  Filled 2022-05-07: qty 40

## 2022-05-07 MED ORDER — HYDROMORPHONE HCL 1 MG/ML IJ SOLN
0.5000 mg | INTRAMUSCULAR | Status: DC | PRN
  Administered 2022-05-07: 0.5 mg via INTRAVENOUS
  Administered 2022-05-08 (×2): 1 mg via INTRAVENOUS
  Administered 2022-05-08: 0.5 mg via INTRAVENOUS
  Administered 2022-05-08: 1 mg via INTRAVENOUS
  Filled 2022-05-07 (×2): qty 1
  Filled 2022-05-07: qty 0.5
  Filled 2022-05-07: qty 1

## 2022-05-07 MED ORDER — PROPOFOL 10 MG/ML IV BOLUS
INTRAVENOUS | Status: DC | PRN
  Administered 2022-05-07: 140 mg via INTRAVENOUS

## 2022-05-07 MED ORDER — LACTATED RINGERS IV SOLN: INTRAVENOUS | Status: DC | PRN

## 2022-05-07 MED ORDER — EPINEPHRINE 1 MG/10ML IJ SOSY
PREFILLED_SYRINGE | INTRAMUSCULAR | Status: AC
  Filled 2022-05-07: qty 20

## 2022-05-07 MED ORDER — SODIUM CHLORIDE (PF) 0.9 % IJ SOLN
INTRAMUSCULAR | Status: AC
  Filled 2022-05-07: qty 50

## 2022-05-07 SURGICAL SUPPLY — 74 items
ADH SKN CLS APL DERMABOND .7 (GAUZE/BANDAGES/DRESSINGS) ×1
AGENT HMST KT MTR STRL THRMB (HEMOSTASIS) ×1
BAG COUNTER SPONGE SURGICOUNT (BAG) ×2 IMPLANT
BAG ISL DRAPE 18X18 STRL (DRAPES) ×1
BAG ISOLATION DRAPE 18X18 (DRAPES) IMPLANT
BAG SPNG CNTER NS LX DISP (BAG) ×1
BANDAGE ESMARK 6X9 LF (GAUZE/BANDAGES/DRESSINGS) IMPLANT
BLADE CLIPPER SURG (BLADE) ×2 IMPLANT
BNDG CMPR 5X4 KNIT ELC UNQ LF (GAUZE/BANDAGES/DRESSINGS) ×1
BNDG CMPR 9X6 STRL LF SNTH (GAUZE/BANDAGES/DRESSINGS)
BNDG CMPR MED 10X6 ELC LF (GAUZE/BANDAGES/DRESSINGS) ×1
BNDG ELASTIC 4INX 5YD STR LF (GAUZE/BANDAGES/DRESSINGS) IMPLANT
BNDG ELASTIC 6X10 VLCR STRL LF (GAUZE/BANDAGES/DRESSINGS) IMPLANT
BNDG ESMARK 6X9 LF (GAUZE/BANDAGES/DRESSINGS)
BNDG GAUZE DERMACEA FLUFF 4 (GAUZE/BANDAGES/DRESSINGS) IMPLANT
BNDG GZE DERMACEA 4 6PLY (GAUZE/BANDAGES/DRESSINGS) ×1
CANISTER SUCT 3000ML PPV (MISCELLANEOUS) ×2 IMPLANT
CATH EMB 3FR 40 (CATHETERS) IMPLANT
CLIP FOGARTY SPRING 6M (CLIP) IMPLANT
CLIP TI MEDIUM 24 (CLIP) ×2 IMPLANT
CLIP TI WIDE RED SMALL 24 (CLIP) ×2 IMPLANT
CNTNR URN SCR LID CUP LEK RST (MISCELLANEOUS) IMPLANT
CONT SPEC 4OZ STRL OR WHT (MISCELLANEOUS) ×1
COVER PROBE W GEL 5X96 (DRAPES) ×2 IMPLANT
CUFF TOURN SGL QUICK 24 (TOURNIQUET CUFF)
CUFF TOURN SGL QUICK 34 (TOURNIQUET CUFF)
CUFF TOURN SGL QUICK 42 (TOURNIQUET CUFF) IMPLANT
CUFF TRNQT CYL 24X4X16.5-23 (TOURNIQUET CUFF) IMPLANT
CUFF TRNQT CYL 34X4.125X (TOURNIQUET CUFF) IMPLANT
DERMABOND ADVANCED .7 DNX12 (GAUZE/BANDAGES/DRESSINGS) ×2 IMPLANT
DRAIN CHANNEL 15F RND FF W/TCR (WOUND CARE) IMPLANT
DRAPE C-ARM 42X72 X-RAY (DRAPES) IMPLANT
DRAPE HALF SHEET 40X57 (DRAPES) IMPLANT
DRESSING PEEL AND PLC PRVNA 13 (GAUZE/BANDAGES/DRESSINGS) IMPLANT
DRSG PEEL AND PLACE PREVENA 13 (GAUZE/BANDAGES/DRESSINGS) ×1
ELECT REM PT RETURN 9FT ADLT (ELECTROSURGICAL) ×1
ELECTRODE REM PT RTRN 9FT ADLT (ELECTROSURGICAL) ×2 IMPLANT
EVACUATOR SILICONE 100CC (DRAIN) IMPLANT
GAUZE PAD ABD 8X10 STRL (GAUZE/BANDAGES/DRESSINGS) IMPLANT
GLOVE BIOGEL PI IND STRL 8 (GLOVE) ×2 IMPLANT
GOWN STRL REUS W/ TWL LRG LVL3 (GOWN DISPOSABLE) ×4 IMPLANT
GOWN STRL REUS W/TWL 2XL LVL3 (GOWN DISPOSABLE) ×4 IMPLANT
GOWN STRL REUS W/TWL LRG LVL3 (GOWN DISPOSABLE) ×2
HEMOSTAT SNOW SURGICEL 2X4 (HEMOSTASIS) IMPLANT
INSERT FOGARTY SM (MISCELLANEOUS) IMPLANT
KIT BASIN OR (CUSTOM PROCEDURE TRAY) ×2 IMPLANT
KIT DRSG PREVENA PLUS 7DAY 125 (MISCELLANEOUS) IMPLANT
KIT TURNOVER KIT B (KITS) ×2 IMPLANT
NS IRRIG 1000ML POUR BTL (IV SOLUTION) ×4 IMPLANT
PACK PERIPHERAL VASCULAR (CUSTOM PROCEDURE TRAY) ×2 IMPLANT
PAD ARMBOARD 7.5X6 YLW CONV (MISCELLANEOUS) ×4 IMPLANT
SET MICROPUNCTURE 5F STIFF (MISCELLANEOUS) IMPLANT
SET WALTER ACTIVATION W/DRAPE (SET/KITS/TRAYS/PACK) IMPLANT
STAPLER VISISTAT 35W (STAPLE) IMPLANT
STOPCOCK 4 WAY LG BORE MALE ST (IV SETS) IMPLANT
SURGIFLO W/THROMBIN 8M KIT (HEMOSTASIS) IMPLANT
SUT ETHILON 3 0 PS 1 (SUTURE) IMPLANT
SUT MNCRL AB 4-0 PS2 18 (SUTURE) ×6 IMPLANT
SUT PROLENE 5 0 C 1 24 (SUTURE) ×2 IMPLANT
SUT PROLENE 6 0 BV (SUTURE) ×2 IMPLANT
SUT PROLENE 7 0 BV 1 (SUTURE) IMPLANT
SUT SILK 2 0 PERMA HAND 18 BK (SUTURE) IMPLANT
SUT SILK 3 0 (SUTURE)
SUT SILK 3-0 18XBRD TIE 12 (SUTURE) IMPLANT
SUT VIC AB 2-0 CT1 27 (SUTURE) ×2
SUT VIC AB 2-0 CT1 TAPERPNT 27 (SUTURE) ×4 IMPLANT
SUT VIC AB 3-0 SH 27 (SUTURE) ×3
SUT VIC AB 3-0 SH 27X BRD (SUTURE) ×6 IMPLANT
TAPE UMBILICAL 1/8X30 (MISCELLANEOUS) IMPLANT
TOWEL GREEN STERILE (TOWEL DISPOSABLE) ×2 IMPLANT
TRAY FOLEY MTR SLVR 16FR STAT (SET/KITS/TRAYS/PACK) ×2 IMPLANT
TUBING EXTENTION W/L.L. (IV SETS) IMPLANT
UNDERPAD 30X36 HEAVY ABSORB (UNDERPADS AND DIAPERS) ×2 IMPLANT
WATER STERILE IRR 1000ML POUR (IV SOLUTION) ×2 IMPLANT

## 2022-05-07 NOTE — Op Note (Signed)
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   SURGEON: [name]  ASSIST: Mosetta Pigeon, [place]  ANESTHESIA: General  EBL: 50 mL  INDICATIONS:    [name] [name] is a 42 y.o. female who presented to the ED with a 1 week history of right lower extremity pain.  The pain progressed to include paresthesias in the foot.  Motor intact.  Imaging demonstrated superficial femoral artery thrombus.  After discussing the risk and benefits of right lower extremity femoral-popliteal embolectomy, 4 compartment fasciotomy, [name] [name] elected to proceed.  FINDINGS:   Acute thrombus removed from the right superficial femoral artery  TECHNIQUE:   Patient was brought to the OR laid in supine position.  General anesthesia was induced and the patient was prepped and draped in standard fashion.  The case began with ultrasound insonation of the right groin.  The patient had a large groin due to morbid obesity.  Oblique incision was made, and carried down to the femoral artery.  The common femoral artery, profunda, superficial femoral artery were controlled with the use of Vesseloops.  The patient was heparinized and ACT greater than 250.  A transverse arteriotomy was made at the common femoral bifurcation.  #3 Fogarty embolectomy catheter was passed.  A large amount of acute thrombus was removed.  3 more passes were made with no thrombotic return.  I performed an embolectomy on the profunda as well with no thrombotic return.  The superficial femoral artery, profunda, common femoral artery were bled prior to closure. The artery was closed using 5-0 Prolene suture in running fashion.    Upon completion, there was a palpable pulse in the foot  and both the dorsalis pedis and posterior tibial arteries.  I then moved to 4 compartment fasciotomies.  I began with a long incision on the lateral aspect of the right calf.  This was carried down to the muscular fascia.  Both the anterior and lateral compartments were opened for greater than 20 cm with use of Metzenbaum scissors underneath the skin incision.  All muscle looked healthy.  Next, I moved to the posterior deep compartments on the medial aspect of the calf.  Another long incision was made, and this was carried down to the muscular fascia of the soleus was taken down, and posterior tibialis exposed.  Both compartments were open for greater than 20 cm.  Wounds were irrigated with copious amounts of saline.  I elected to give a small amount of protamine in an effort to quell nonsurgical bleeding.  Hemostasis was achieved with use of thrombin product and suture.  Once dried, the groin was closed in layers of Vicryl suture with staples and Prevena vacuum dressing at the level of the skin.  The fasciotomy sites were closed with staples, with plans to open at bedside should the patient have any symptomatic swelling.  At case completion, [name] [name] had a palpable dorsalis pedis and posterior tibial pulse  [name] Snide, MD [facility] DATE OF DICTATION:   05/07/2022

## 2022-05-07 NOTE — ED Notes (Signed)
Help get patient on the monitor patient is resting with call bell in reach 

## 2022-05-07 NOTE — ED Triage Notes (Signed)
Pt arrives c/o R leg pain and paresthesias that started about 1.5 weeks ago, but has been progressively getting worse. Pt states that she is here tonight due to the pain being unbearable. Pt ambulatory from lobby to room. Takes gabapentin for neuropathy - last dose was last night. Pt tearful upon arrival. Denies taking anything for pain.

## 2022-05-07 NOTE — Transfer of Care (Signed)
Immediate Anesthesia Transfer of Care Note  Patient: [name] [name]  Procedure(s) Performed: RIGHT FEMORAL-POPLITEAL ARTERY EMBOLECTOMY AND 4  COMPARMENT FASCIOTOMY (Right: Leg Lower)  Patient Location: PACU  Anesthesia Type:General  Level of Consciousness: awake, alert , and oriented  Airway & Oxygen Therapy: Patient Spontanous Breathing and Patient connected to nasal cannula oxygen  Post-op Assessment: Report given to RN, Post -op Vital signs reviewed and stable, Patient moving all extremities X 4, and Patient able to stick tongue midline  Post vital signs: Reviewed  Last Vitals:  Vitals Value Taken Time  BP 156/68   Temp 98.1   Pulse 64 05/07/22 1829  Resp 14 05/07/22 1829  SpO2 90 % 05/07/22 1829  Vitals shown include unvalidated device data.  Last Pain:  Vitals:   05/07/22 1552  TempSrc: Oral  PainSc:          Complications: No notable events documented.

## 2022-05-07 NOTE — ED Notes (Signed)
Pt on bedpan at this time.

## 2022-05-07 NOTE — ED Provider Notes (Signed)
Received signout from previous provider, please see his note for complete H&P.  This is a 42 year old female presenting complaining of atraumatic right thigh pain ongoing for more than a week.  Workup thus far is remarkable for mildly elevated lactic acid of 2.2, elevated white count of 13.9 however patient without any obvious signs of infection such as cellulitis or necrotizing fasciitis.  Currently awaits for DVT study of her right lower extremity.  If negative, may consider CT scanning to rule out deep tissue infection.  On exam, patient is sitting up in her bed appears very uncomfortable, crying.  She does have focal tenderness noted to her right inner thigh on palpation without any crepitus emphysema no overlying skin changes of leg compartment is soft, patellar deep tendon reflexes 2+, she has palpable pedal pulses and she has normal skin color.  Patient does not have any reproducible lumbar spine tenderness.  Her abdomen is soft and nontender.  Labs obtained and reviewed interpreted by me.  Patient does have an elevated white count of 13.3 and an elevated lactic acid of 2.2.  She is however afebrile and she is not hypotensive.  Her blood sugar is elevated at 288 with normal anion gap.  She has a normal total CK which makes rhabdomyolysis unlikely.  She has normal renal function.  Initial x-ray of her right femur was obtained independently viewed interpreted by me and I agree with radiology interpretation.  No evidence of gas within the soft tissue of the thigh.  Some mild degenerative changes to the hip were noted.  I did reach out to our radiologist in terms of advanced imaging, and felt a CT scan of the lower abdomen pelvis with venogram will be appropriate to rule out any vascular pathology or deep tissue infection.  Additional opiate pain medication was given as patient report minimal improvement after receiving Dilaudid and Valium earlier in the night.  11:03 AM CT venogram study demonstrate  low density filling defect within the right superficial femoral artery consistent with an acute arterial thrombosis.  No evidence of DVT.  Patient has intact distal pedal pulses.  She does not have any evidence of a hematoma to her skin.  She denies any active chest pain or trouble breathing.  I appreciate consultation from on-call vascular surgeon Dr. Sherral Hammers who request patient to be heparinized and sent to [facility], ER to ER so he can evaluate patient.  I have reached out to attending Dr. Criss Alvine who agrees to accept patient.  Patient made aware of plan and agrees with plan.  I have order abdomen pelvis CT angiogram with run-off for further assessment per request of vascular surgeon.   .Critical Care  Performed by: [name], PA-C Authorized by: [name], PA-C   Critical care provider statement:    Critical care time (minutes):  50   Critical care was time spent personally by me on the following activities:  Development of treatment plan with patient or surrogate, discussions with consultants, evaluation of patient's response to treatment, examination of patient, ordering and review of laboratory studies, ordering and review of radiographic studies, ordering and performing treatments and interventions, pulse oximetry, re-evaluation of patient's condition and review of old charts  BP (!) 140/76   Pulse 76   Temp 97.8 F (36.6 C) (Oral)   Resp (!) 21   Ht 5\' 1"  (1.549 m)   Wt 81.2 kg   LMP 04/29/2022 (Approximate) Comment: pregnancy waiver form signed 05-07-2022  SpO2 94%   BMI  33.82 kg/m   Results for orders placed or performed during the hospital encounter of 05/07/22  CK  Result Value Ref Range   Total CK 69 38 - 234 U/L  Basic metabolic panel  Result Value Ref Range   Sodium 135 135 - 145 mmol/L   Potassium 4.5 3.5 - 5.1 mmol/L   Chloride 104 98 - 111 mmol/L   CO2 19 (L) 22 - 32 mmol/L   Glucose, Bld 288 (H) 70 - 99 mg/dL   BUN 14 6 - 20 mg/dL   Creatinine, Ser 4.09  0.44 - 1.00 mg/dL   Calcium 8.5 (L) 8.9 - 10.3 mg/dL   GFR, Estimated >81 >19 mL/min   Anion gap 12 5 - 15  CBC with Differential  Result Value Ref Range   WBC 13.3 (H) 4.0 - 10.5 K/uL   RBC 5.09 3.87 - 5.11 MIL/uL   Hemoglobin 14.7 12.0 - 15.0 g/dL   HCT 14.7 82.9 - 56.2 %   MCV 87.2 80.0 - 100.0 fL   MCH 28.9 26.0 - 34.0 pg   MCHC 33.1 30.0 - 36.0 g/dL   RDW 13.0 86.5 - 78.4 %   Platelets 267 150 - 400 K/uL   nRBC 0.0 0.0 - 0.2 %   Neutrophils Relative % 72 %   Neutro Abs 9.6 (H) 1.7 - 7.7 K/uL   Lymphocytes Relative 22 %   Lymphs Abs 2.9 0.7 - 4.0 K/uL   Monocytes Relative 5 %   Monocytes Absolute 0.7 0.1 - 1.0 K/uL   Eosinophils Relative 1 %   Eosinophils Absolute 0.1 0.0 - 0.5 K/uL   Basophils Relative 0 %   Basophils Absolute 0.0 0.0 - 0.1 K/uL   Immature Granulocytes 0 %   Abs Immature Granulocytes 0.05 0.00 - 0.07 K/uL  Lactic acid, plasma  Result Value Ref Range   Lactic Acid, Venous 2.2 (HH) 0.5 - 1.9 mmol/L  Lactic acid, plasma  Result Value Ref Range   Lactic Acid, Venous 1.5 0.5 - 1.9 mmol/L   CT VENOGRAM ABD/PELVIS/LOWER EXT BILAT  Result Date: 05/07/2022 CLINICAL DATA:  Acute lower leg pain and paresthesia beginning 1.5 weeks ago. Symptoms have progressively worsened. EXAM: CT VENOGRAM ABDOMEN AND PELVIS AND LOWER EXTREMITY BILATERAL TECHNIQUE: Venographic phase images of the abdomen, pelvis and lower extremities were obtained following the administration of intravenous contrast. Multiplanar reformats and maximum intensity projections were generated. RADIATION DOSE REDUCTION: This exam was performed according to the departmental dose-optimization program which includes automated exposure control, adjustment of the mA and/or kV according to patient size and/or use of iterative reconstruction technique. CONTRAST:  OMNIPAQUE IOHEXOL 350 MG/ML SOLN COMPARISON:  None available FINDINGS: Lower chest: Nonspecific ground-glass opacities of the lung bases, most likely  due to acute pneumonia or chronic interstitial pneumonitis. 6 mm noncalcified nodule is seen in the lingula (image 15, series 11). Hepatobiliary: No focal liver abnormality is seen. No gallstones, gallbladder wall thickening, or biliary dilatation. Pancreas: Mild fatty atrophy of the head and uncinate process. No ductal dilatation. No significant abnormality of the body or tail. Spleen: Normal in size without focal abnormality. Adrenals/Urinary Tract: Adrenal glands are unremarkable. Kidneys are normal, without renal calculi, focal lesion, or hydronephrosis. Bladder is unremarkable. Stomach/Bowel: Mild sigmoid diverticulosis. No bowel dilatation to indicate ileus or obstruction. Appendix is normal. Vascular/Lymphatic: No enlarged abdominal or pelvic lymph nodes. Reproductive: Uterus and bilateral adnexa are unremarkable. Other: No abdominal wall hernia or abnormality. No abdominopelvic ascites. Musculoskeletal: Deformity in the posterior  segment of the right eighth rib, right sacral ala, and left inferior pubic ramus consistent with remote healed fractures. Confluent bulky anterior flowing osteophytes at multiple thoracic levels consistent with diffuse idiopathic skeletal hyperostosis. No acute osseous abnormality. IVC: Infrarenal IVC filter is seen, most likely permanent Vena Tech filter. No thrombus identified within the inferior vena cava. Portal and mesenteric veins: No evidence for thrombus or stenosis. Bilateral iliac veins: No evidence for thrombus or stenosis. Right lower extremity: Heterogeneous enhancement of the right femoral and popliteal veins are likely due to inter mixing of opacified and unopacified blood rather than acute DVT given no thrombus reported in the right lower extremity venous Doppler performed today. However, there is low-density filling defect within the right superficial femoral artery suspicious for acute arterial thrombosis. Left lower extremity: No evidence for thrombus involving  the common femoral, femoral, popliteal and visualized deep calf veins. IMPRESSION: 1. Low-density filling defect within the right superficial femoral artery consistent with acute arterial thrombosis. Right common femoral and popliteal arteries are patent. 2. Heterogeneous enhancement of the right femoral and popliteal vein is most likely artifact related to inter mixing of opacified and unopacified blood rather than thrombus given no DVT identified on Doppler performed today. 3. No thrombus identified within the inferior vena cava, iliac veins, or left lower extremity veins. IVC filter is present. 4. Nonspecific ground-glass opacities of the lung bases, most likely due to acute pneumonia or chronic interstitial pneumonitis, such as hypersensitivity pneumonitis. 5. 6 mm noncalcified nodule seen in the lingula. Non-contrast chest CT at 6-12 months is recommended. If the nodule is stable at time of repeat CT, then future CT at 18-24 months (from today's scan) is considered optional for low-risk patients, but is recommended for high-risk patients. This recommendation follows the consensus statement: Guidelines for Management of Incidental Pulmonary Nodules Detected on CT Images: From the Fleischner Society 2017; Radiology 2017; 284:228-243. These results were called by telephone at the time of interpretation on 05/07/2022 at 10:55 am to provider [facility] , who verbally acknowledged these results. Electronically Signed   By: Acquanetta Belling M.D.   On: 05/07/2022 10:56   VAS [place] LOWER EXTREMITY VENOUS (DVT) (ONLY [facility] & [facility])  Result Date: 05/07/2022  Lower Venous DVT Study Patient Name:  LAVORA KRELLER  Date of Exam:   05/07/2022 Medical Rec #: [id number]        Accession #:    [id number] Date of Birth: [birth year]        Patient Gender: F Patient Age:   35 years Exam Location:  [facility] Procedure:      VAS [place] LOWER EXTREMITY VENOUS (DVT) Referring Phys: Berle Mull  --------------------------------------------------------------------------------  Indications: Swelling.  Risk Factors: None identified. Limitations: Poor ultrasound/tissue interface. Comparison Study: No prior studies. Performing Technologist: Chanda Busing RVT  Examination Guidelines: A complete evaluation includes B-mode imaging, spectral Doppler, color Doppler, and power Doppler as needed of all accessible portions of each vessel. Bilateral testing is considered an integral part of a complete examination. Limited examinations for reoccurring indications may be performed as noted. The reflux portion of the exam is performed with the patient in reverse Trendelenburg.  +---------+---------------+---------+-----------+----------+--------------+ RIGHT    CompressibilityPhasicitySpontaneityPropertiesThrombus Aging +---------+---------------+---------+-----------+----------+--------------+ CFV      Full           Yes      Yes                                 +---------+---------------+---------+-----------+----------+--------------+  SFJ      Full                                                        +---------+---------------+---------+-----------+----------+--------------+ FV Prox  Full                                                        +---------+---------------+---------+-----------+----------+--------------+ FV Mid   Full                                                        +---------+---------------+---------+-----------+----------+--------------+ FV DistalFull                                                        +---------+---------------+---------+-----------+----------+--------------+ PFV      Full                                                        +---------+---------------+---------+-----------+----------+--------------+ POP      Full           Yes      Yes                                  +---------+---------------+---------+-----------+----------+--------------+ PTV      Full                                                        +---------+---------------+---------+-----------+----------+--------------+ PERO     Full                                                        +---------+---------------+---------+-----------+----------+--------------+   +----+---------------+---------+-----------+----------+--------------+ LEFTCompressibilityPhasicitySpontaneityPropertiesThrombus Aging +----+---------------+---------+-----------+----------+--------------+ CFV Full           Yes      Yes                                 +----+---------------+---------+-----------+----------+--------------+    Summary: RIGHT: - There is no evidence of deep vein thrombosis in the lower extremity.  - No cystic structure found in the popliteal fossa.  LEFT: - No evidence of common femoral vein obstruction.  *See table(s) above for measurements and observations.    Preliminary    DG  Femur Portable Min 2 Views Right  Result Date: 05/07/2022 CLINICAL DATA:  Femur pain for 1.5 weeks radiating from groin to knee. Evaluate for soft tissue gas. EXAM: RIGHT FEMUR PORTABLE 2 VIEW COMPARISON:  None Available. FINDINGS: Two views study shows no evidence for gas within the soft tissues of the thigh. Linear lucencies in the soft tissue of the thigh are compatible with intermuscular and intramuscular no worrisome lytic or sclerotic osseous abnormality. Fat planes degenerative changes are noted in the hips. IMPRESSION: 1. No evidence for gas within the soft tissues of the thigh. There is high clinical index of concern, CT imaging would be a more sensitive means to investigate. 2. Degenerative changes in the hip. If Electronically Signed   By: Kennith Center M.D.   On: 05/07/2022 06:44        [name], PA-C 05/07/22 1219    [name] Lefevre, [name] [name] 05/07/22 [phone]

## 2022-05-07 NOTE — ED Notes (Signed)
Assumed care of patient. Patient is tearfully resting in bed with no signs of acute distress noted. Patient reports pain meds did not help, they only made her feel "out of my head." Redrew blood work, patient asking for assistance to use the restroom.

## 2022-05-07 NOTE — ED Notes (Addendum)
Pt back from x-ray.

## 2022-05-07 NOTE — Anesthesia Postprocedure Evaluation (Signed)
Anesthesia Post Note  Patient: [name] [name]  Procedure(s) Performed: RIGHT FEMORAL-POPLITEAL ARTERY EMBOLECTOMY AND 4  COMPARMENT FASCIOTOMY (Right: Leg Lower)     Patient location during evaluation: PACU Anesthesia Type: General Level of consciousness: awake and alert Pain management: pain level controlled Vital Signs Assessment: post-procedure vital signs reviewed and stable Respiratory status: spontaneous breathing, nonlabored ventilation and respiratory function stable Cardiovascular status: stable and blood pressure returned to baseline Anesthetic complications: no   No notable events documented.  Last Vitals:  Vitals:   05/07/22 1900 05/07/22 1915  BP: (!) 146/81 (!) 142/88  Pulse: 75 (!) 59  Resp: 15 12  Temp:    SpO2: 94% 97%    Last Pain:  Vitals:   05/07/22 1552  TempSrc: Oral  PainSc:                  Beryle Lathe

## 2022-05-07 NOTE — Anesthesia Procedure Notes (Signed)
Procedure Name: Intubation Date/Time: 05/07/2022 4:38 PM  Performed by: Cy Blamer, CRNAPre-anesthesia Checklist: Patient identified, Emergency Drugs available, Suction available and Patient being monitored Patient Re-evaluated:Patient Re-evaluated prior to induction Oxygen Delivery Method: Circle system utilized Preoxygenation: Pre-oxygenation with 100% oxygen Induction Type: IV induction Ventilation: Mask ventilation without difficulty Laryngoscope Size: Miller and 2 Grade View: Grade I Tube type: Oral Tube size: 6.5 mm Number of attempts: 1 Airway Equipment and Method: Stylet and Bite block Placement Confirmation: ETT inserted through vocal cords under direct vision, positive ETCO2 and breath sounds checked- equal and bilateral Secured at: 22 cm Tube secured with: Tape Dental Injury: Teeth and Oropharynx as per pre-operative assessment

## 2022-05-07 NOTE — ED Notes (Signed)
Patient transported to X-ray 
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
## 2022-05-07 NOTE — Progress Notes (Signed)
Right lower extremity venous duplex has been completed. Preliminary results can be found in CV Proc through chart review.  Results were given to [name] PA.  05/07/22 9:40 AM Olen Cordial RVT

## 2022-05-07 NOTE — Anesthesia Preprocedure Evaluation (Signed)
Anesthesia Evaluation  Patient identified by MRN, date of birth, ID band Patient awake    Reviewed: Allergy & Precautions, NPO status , Patient's Chart, lab work & pertinent test results  History of Anesthesia Complications Negative for: history of anesthetic complications  Airway Mallampati: II  TM Distance: >3 FB Neck ROM: Full    Dental  (+) Partial Upper, Partial Lower, Dental Advisory Given   Pulmonary neg shortness of breath, neg sleep apnea, neg COPD, neg recent URI, Current Smoker   breath sounds clear to auscultation       Cardiovascular hypertension, Pt. on medications  Rhythm:Regular Rate:Normal     Neuro/Psych  PSYCHIATRIC DISORDERS Anxiety Depression     Neuromuscular disease    GI/Hepatic Neg liver ROS,GERD  Controlled,,  Endo/Other  diabetes, Insulin Dependent    Renal/GU negative Renal ROS     Musculoskeletal   Abdominal  (+) + obese  Peds  Hematology negative hematology ROS (+)   Anesthesia Other Findings   Reproductive/Obstetrics                             Anesthesia Physical Anesthesia Plan  ASA: 3 and emergent  Anesthesia Plan: General   Post-op Pain Management: Ofirmev IV (intra-op)*   Induction: Intravenous, Rapid sequence and Cricoid pressure planned  PONV Risk Score and Plan: 3 and Ondansetron, Dexamethasone and Treatment may vary due to age or medical condition  Airway Management Planned: Oral ETT  Additional Equipment: None  Intra-op Plan:   Post-operative Plan: Extubation in OR  Informed Consent: I have reviewed the patients History and Physical, chart, labs and discussed the procedure including the risks, benefits and alternatives for the proposed anesthesia with the patient or authorized representative who has indicated his/her understanding and acceptance.     Dental advisory given  Plan Discussed with: CRNA  Anesthesia Plan Comments:          Anesthesia Quick Evaluation

## 2022-05-07 NOTE — ED Provider Notes (Signed)
2:38 PM Karin Lieu - needs CTA aortobifem with runoff STAT. [facility] called.  Patient is going to OR with vascular.   Pricilla Loveless, MD 05/07/22 [phone]

## 2022-05-07 NOTE — Anesthesia Procedure Notes (Addendum)
Arterial Line Insertion Start/End4/25/2024 4:35 PM, 05/07/2022 4:44 PM Performed by: Lewie Loron, MD, anesthesiologist  Patient location: Pre-op. Preanesthetic checklist: patient identified, IV checked, site marked, risks and benefits discussed, surgical consent, monitors and equipment checked, pre-op evaluation, timeout performed and anesthesia consent Lidocaine 1% used for infiltration Left, radial was placed Catheter size: 20 G Hand hygiene performed , maximum sterile barriers used  and Seldinger technique used  Attempts: 2 Procedure performed without using ultrasound guided technique. Following insertion, dressing applied and Biopatch. Post procedure assessment: normal and unchanged  Patient tolerated the procedure well with no immediate complications.
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
## 2022-05-07 NOTE — ED Notes (Signed)
Report given to Clydie Braun RN CN [facility] then spoke with Gala Romney at [facility] to arrange emergent transport.
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
## 2022-05-08 ENCOUNTER — Inpatient Hospital Stay (HOSPITAL_COMMUNITY): Payer: No Typology Code available for payment source

## 2022-05-08 ENCOUNTER — Other Ambulatory Visit (HOSPITAL_COMMUNITY): Payer: Self-pay

## 2022-05-08 ENCOUNTER — Other Ambulatory Visit (HOSPITAL_COMMUNITY): Payer: No Typology Code available for payment source

## 2022-05-08 ENCOUNTER — Encounter (HOSPITAL_COMMUNITY): Payer: Self-pay | Admitting: Vascular Surgery

## 2022-05-08 LAB — GLUCOSE, CAPILLARY
Glucose-Capillary: 181 mg/dL — ABNORMAL HIGH (ref 70–99)
Glucose-Capillary: 225 mg/dL — ABNORMAL HIGH (ref 70–99)
Glucose-Capillary: 266 mg/dL — ABNORMAL HIGH (ref 70–99)
Glucose-Capillary: 268 mg/dL — ABNORMAL HIGH (ref 70–99)

## 2022-05-08 LAB — HEMOGLOBIN A1C
Hgb A1c MFr Bld: 9 % — ABNORMAL HIGH (ref 4.8–5.6)
Mean Plasma Glucose: 211.6 mg/dL

## 2022-05-08 LAB — CBC
HCT: 35.8 % — ABNORMAL LOW (ref 36.0–46.0)
Hemoglobin: 12.2 g/dL (ref 12.0–15.0)
MCH: 29 pg (ref 26.0–34.0)
MCHC: 34.1 g/dL (ref 30.0–36.0)
MCV: 85.2 fL (ref 80.0–100.0)
Platelets: 276 10*3/uL (ref 150–400)
RBC: 4.2 MIL/uL (ref 3.87–5.11)
RDW: 13.3 % (ref 11.5–15.5)
WBC: 10.9 10*3/uL — ABNORMAL HIGH (ref 4.0–10.5)
nRBC: 0 % (ref 0.0–0.2)

## 2022-05-08 LAB — BASIC METABOLIC PANEL
Anion gap: 8 (ref 5–15)
BUN: 5 mg/dL — ABNORMAL LOW (ref 6–20)
CO2: 25 mmol/L (ref 22–32)
Calcium: 8.4 mg/dL — ABNORMAL LOW (ref 8.9–10.3)
Chloride: 100 mmol/L (ref 98–111)
Creatinine, Ser: 0.5 mg/dL (ref 0.44–1.00)
GFR, Estimated: >60 mL/min (ref >60)
Glucose, Bld: 178 mg/dL — ABNORMAL HIGH (ref 70–99)
Potassium: 3.5 mmol/L (ref 3.5–5.1)
Sodium: 133 mmol/L — ABNORMAL LOW (ref 135–145)

## 2022-05-08 LAB — LIPID PANEL
Cholesterol: 138 mg/dL (ref 0–200)
HDL: 30 mg/dL — ABNORMAL LOW (ref >40)
LDL Cholesterol: 39 mg/dL (ref 0–99)
Total CHOL/HDL Ratio: 4.6 RATIO
Triglycerides: 347 mg/dL — ABNORMAL HIGH (ref <150)
VLDL: 69 mg/dL — ABNORMAL HIGH (ref 0–40)

## 2022-05-08 LAB — HEPARIN LEVEL (UNFRACTIONATED)
Heparin Unfractionated: <0.1 IU/mL — ABNORMAL LOW (ref 0.30–0.70)
Heparin Unfractionated: <0.1 IU/mL — ABNORMAL LOW (ref 0.30–0.70)

## 2022-05-08 MED ORDER — LISINOPRIL 20 MG PO TABS
40.0000 mg | ORAL_TABLET | Freq: Every day | ORAL | Status: DC
  Administered 2022-05-08 – 2022-05-09 (×2): 40 mg via ORAL
  Filled 2022-05-08 (×2): qty 2

## 2022-05-08 MED ORDER — INSULIN GLARGINE-YFGN 100 UNIT/ML ~~LOC~~ SOLN
28.0000 [IU] | Freq: Every day | SUBCUTANEOUS | Status: DC
  Administered 2022-05-08: 28 [IU] via SUBCUTANEOUS
  Filled 2022-05-08 (×2): qty 0.28

## 2022-05-08 MED ORDER — ROSUVASTATIN CALCIUM 20 MG PO TABS
20.0000 mg | ORAL_TABLET | Freq: Every day | ORAL | Status: DC
  Administered 2022-05-08 – 2022-05-09 (×2): 20 mg via ORAL
  Filled 2022-05-08 (×2): qty 1

## 2022-05-08 MED ORDER — HYDRALAZINE HCL 20 MG/ML IJ SOLN: 5.0000 mg | INTRAMUSCULAR | Status: DC | PRN

## 2022-05-08 MED ORDER — METFORMIN HCL ER 500 MG PO TB24
500.0000 mg | ORAL_TABLET | Freq: Two times a day (BID) | ORAL | Status: DC
  Filled 2022-05-08: qty 1

## 2022-05-08 MED ORDER — CEFAZOLIN SODIUM-DEXTROSE 2-4 GM/100ML-% IV SOLN
2.0000 g | Freq: Three times a day (TID) | INTRAVENOUS | Status: AC
  Administered 2022-05-08 (×2): 2 g via INTRAVENOUS
  Filled 2022-05-08 (×2): qty 100

## 2022-05-08 MED ORDER — GUAIFENESIN-DM 100-10 MG/5ML PO SYRP: 15.0000 mL | ORAL_SOLUTION | ORAL | Status: DC | PRN

## 2022-05-08 MED ORDER — ORAL CARE MOUTH RINSE: 15.0000 mL | OROMUCOSAL | Status: DC | PRN

## 2022-05-08 MED ORDER — SODIUM CHLORIDE 0.9 % IV SOLN: 500.0000 mL | Freq: Once | INTRAVENOUS | Status: DC | PRN

## 2022-05-08 MED ORDER — NICOTINE 14 MG/24HR TD PT24
14.0000 mg | MEDICATED_PATCH | Freq: Every day | TRANSDERMAL | Status: DC
  Filled 2022-05-08 (×2): qty 1

## 2022-05-08 MED ORDER — LABETALOL HCL 5 MG/ML IV SOLN: 10.0000 mg | INTRAVENOUS | Status: DC | PRN

## 2022-05-08 MED ORDER — SENNOSIDES-DOCUSATE SODIUM 8.6-50 MG PO TABS: 1.0000 | ORAL_TABLET | Freq: Every evening | ORAL | Status: DC | PRN

## 2022-05-08 MED ORDER — METOPROLOL TARTRATE 5 MG/5ML IV SOLN: 2.0000 mg | INTRAVENOUS | Status: DC | PRN

## 2022-05-08 MED ORDER — BISACODYL 5 MG PO TBEC: 5.0000 mg | DELAYED_RELEASE_TABLET | Freq: Every day | ORAL | Status: DC | PRN

## 2022-05-08 MED ORDER — POTASSIUM CHLORIDE CRYS ER 20 MEQ PO TBCR: 20.0000 meq | EXTENDED_RELEASE_TABLET | Freq: Every day | ORAL | Status: DC | PRN

## 2022-05-08 MED ORDER — MAGNESIUM SULFATE 2 GM/50ML IV SOLN: 2.0000 g | Freq: Every day | INTRAVENOUS | Status: DC | PRN

## 2022-05-08 MED ORDER — PANTOPRAZOLE SODIUM 40 MG PO TBEC
40.0000 mg | DELAYED_RELEASE_TABLET | Freq: Every day | ORAL | Status: DC
  Administered 2022-05-08 – 2022-05-09 (×2): 40 mg via ORAL
  Filled 2022-05-08 (×2): qty 1

## 2022-05-08 MED ORDER — ASPIRIN 81 MG PO TBEC
81.0000 mg | DELAYED_RELEASE_TABLET | Freq: Every day | ORAL | Status: DC
  Administered 2022-05-08 – 2022-05-09 (×2): 81 mg via ORAL
  Filled 2022-05-08 (×2): qty 1

## 2022-05-08 MED ORDER — DOCUSATE SODIUM 100 MG PO CAPS
100.0000 mg | ORAL_CAPSULE | Freq: Every day | ORAL | Status: DC
  Administered 2022-05-08 – 2022-05-09 (×2): 100 mg via ORAL
  Filled 2022-05-08 (×2): qty 1

## 2022-05-08 MED ORDER — PHENOL 1.4 % MT LIQD: 1.0000 | OROMUCOSAL | Status: DC | PRN

## 2022-05-08 MED ORDER — INSULIN GLARGINE-YFGN 100 UNIT/ML ~~LOC~~ SOPN
28.0000 [IU] | PEN_INJECTOR | Freq: Every day | SUBCUTANEOUS | Status: DC
  Filled 2022-05-08: qty 3

## 2022-05-08 MED ORDER — ACETAMINOPHEN 325 MG PO TABS: 325.0000 mg | ORAL_TABLET | ORAL | Status: DC | PRN

## 2022-05-08 MED ORDER — IOHEXOL 350 MG/ML SOLN
75.0000 mL | Freq: Once | INTRAVENOUS | Status: AC | PRN
  Administered 2022-05-08: 75 mL via INTRAVENOUS

## 2022-05-08 MED ORDER — SODIUM CHLORIDE 0.9 % IV SOLN: INTRAVENOUS | Status: DC

## 2022-05-08 MED ORDER — ALUM & MAG HYDROXIDE-SIMETH 200-200-20 MG/5ML PO SUSP: 15.0000 mL | ORAL | Status: DC | PRN

## 2022-05-08 MED ORDER — HEPARIN (PORCINE) 25000 UT/250ML-% IV SOLN
1700.0000 [IU]/h | INTRAVENOUS | Status: DC
  Administered 2022-05-08: 1200 [IU]/h via INTRAVENOUS
  Administered 2022-05-09: 1700 [IU]/h via INTRAVENOUS
  Filled 2022-05-08 (×2): qty 250

## 2022-05-08 MED ORDER — ONDANSETRON HCL 4 MG/2ML IJ SOLN: 4.0000 mg | Freq: Four times a day (QID) | INTRAMUSCULAR | Status: DC | PRN

## 2022-05-08 MED ORDER — ACETAMINOPHEN 650 MG RE SUPP: 325.0000 mg | RECTAL | Status: DC | PRN

## 2022-05-08 NOTE — Evaluation (Signed)
Physical Therapy Evaluation Patient Details Name: [name] [name] MRN: [id number] DOB: 1980/06/08 Today's Date: 05/08/2022  History of Present Illness  42 yo female admitted 4/25 with RLE pain and neuropathy,  RLE thrombus from the mid SFA. 4/25 s/p RLE fem pop BPG with 4 compartment fasciotomy and VAC placement. PMHx: MDD, T2DM, GERD, HTN, retinopathy, PTSD  Clinical Impression  Pt pleasant and very willing to mobilize despite pain. Pt normally independent, working and driving. Pt educated for parts of VAC and how to manage with mobility. Pt with expected post op pain and educated for HEP, transfers and gait. Pt has necessary DME and assist at home and will benefit from acute therapy to maximize gait and function without need for further therapy at D/C. Encouraged walking and up to bathroom during day.        Recommendations for follow up therapy are one component of a multi-disciplinary discharge planning process, led by the attending physician.  Recommendations may be updated based on patient status, additional functional criteria and insurance authorization.  Follow Up Recommendations       Assistance Recommended at Discharge PRN  Patient can return home with the following  Assistance with cooking/housework    Equipment Recommendations None recommended by PT  Recommendations for Other Services       Functional Status Assessment Patient has had a recent decline in their functional status and/or demonstrates limited ability to make significant improvements in function in a reasonable and predictable amount of time     Precautions / Restrictions Precautions Precautions: Fall Precaution Comments: R groin wound vac Restrictions Weight Bearing Restrictions: Yes RLE Weight Bearing: Weight bearing as tolerated      Mobility  Bed Mobility Overal bed mobility: Modified Independent Bed Mobility: Supine to Sit, Sit to Supine           General bed mobility comments: pt denied OOB  to chair and able to transition to and from EOB without assist    Transfers Overall transfer level: Modified independent                      Ambulation/Gait Ambulation/Gait assistance: Supervision Gait Distance (Feet): 300 Feet Assistive device: Rolling walker (2 wheels) Gait Pattern/deviations: Step-through pattern, Decreased stride length, Decreased stance time - right   Gait velocity interpretation: 1.31 - 2.62 ft/sec, indicative of limited community ambulator   General Gait Details: cues for proximity and sequence, decreased speed and able to walk grossly 5' without RW but preference for RW due to pain  Stairs Stairs: Yes Stairs assistance: Supervision Stair Management: Step to pattern, Forwards, Two rails Number of Stairs: 3    Wheelchair Mobility    Modified Rankin (Stroke Patients Only)       Balance Overall balance assessment: Mild deficits observed, not formally tested                                           Pertinent Vitals/Pain Pain Assessment Pain Score: 8  Pain Location: RLE Pain Descriptors / Indicators: Aching, Constant Pain Intervention(s): Limited activity within patient's tolerance, Monitored during session, RN gave pain meds during session, Repositioned    Home Living Family/patient expects to be discharged to:: Private residence Living Arrangements: Children;Parent Available Help at Discharge: Family;Available 24 hours/day Type of Home: Mobile home Home Access: Stairs to enter Entrance Stairs-Rails: Right;Left Entrance Stairs-Number of Steps: 4  Home Layout: One level Home Equipment: Agricultural consultant (2 wheels);Hand held shower head;BSC/3in1 Additional Comments: Works as International aid/development worker at [employer]    Prior Function Prior Level of Function : Independent/Modified Independent;Driving;Working/employed             Mobility Comments: indepdendent no AD ADLs Comments: indepedent in bADLs/iADLs     Hand  Dominance   Dominant Hand: Right    Extremity/Trunk Assessment   Upper Extremity Assessment Upper Extremity Assessment: Overall WFL for tasks assessed    Lower Extremity Assessment Lower Extremity Assessment: Overall WFL for tasks assessed RLE Deficits / Details: limited ROM due to pain as expected but able to move through full ROM RLE: Unable to fully assess due to pain    Cervical / Trunk Assessment Cervical / Trunk Assessment: Normal  Communication   Communication: No difficulties  Cognition Arousal/Alertness: Awake/alert Behavior During Therapy: WFL for tasks assessed/performed Overall Cognitive Status: Within Functional Limits for tasks assessed                                          General Comments General comments (skin integrity, edema, etc.): VSS on RA. SPo2 at 92-94% during functional ambulation    Exercises General Exercises - Lower Extremity Ankle Circles/Pumps: AROM, Right, Seated, 5 reps   Assessment/Plan    PT Assessment Patient needs continued PT services  PT Problem List Decreased activity tolerance;Decreased mobility       PT Treatment Interventions      PT Goals (Current goals can be found in the Care Plan section)  Acute Rehab PT Goals Patient Stated Goal: return to work PT Goal Formulation: With patient Time For Goal Achievement: 05/22/22 Potential to Achieve Goals: Good    Frequency Min 1X/week     Co-evaluation               AM-PAC PT "6 Clicks" Mobility  Outcome Measure Help needed turning from your back to your side while in a flat bed without using bedrails?: None Help needed moving from lying on your back to sitting on the side of a flat bed without using bedrails?: None Help needed moving to and from a bed to a chair (including a wheelchair)?: None Help needed standing up from a chair using your arms (e.g., wheelchair or bedside chair)?: None Help needed to walk in hospital room?: A Little Help needed  climbing 3-5 steps with a railing? : A Little 6 Click Score: 22    End of Session   Activity Tolerance: Patient tolerated treatment well Patient left: in bed;with call bell/phone within reach Nurse Communication: Mobility status PT Visit Diagnosis: Other abnormalities of gait and mobility (R26.89)    Time: 3086-5784 PT Time Calculation (min) (ACUTE ONLY): 19 min   Charges:   PT Evaluation $PT Eval Moderate Complexity: 1 Mod          Laverna Dossett P, PT Acute Rehabilitation Services Office: [phone]   Enedina Finner Ashling Roane 05/08/2022, 12:01 PM

## 2022-05-08 NOTE — Progress Notes (Signed)
ANTICOAGULATION CONSULT NOTE - Initial Consult  Pharmacy Consult for Heparin Indication: RLE arterial thrombus  Allergies  Allergen Reactions   Sulfa Antibiotics     Patient Measurements: Height: 5\' 1"  (154.9 cm) Weight: 81.2 kg (179 lb) IBW/kg (Calculated) : 47.8 Heparin Dosing Weight: 66 kg  Vital Signs: Temp: 98.1 F (36.7 C) (04/26 0455) Temp Source: Oral (04/26 0455) BP: 112/66 (04/26 0500) Pulse Rate: 75 (04/26 0500)  Labs: Recent Labs    05/07/22 0521  HGB 14.7  HCT 44.4  PLT 267  CREATININE 0.58  CKTOTAL 69     Estimated Creatinine Clearance: 86.7 mL/min (by C-G formula based on SCr of 0.58 mg/dL).   Medical History: Past Medical History:  Diagnosis Date   Anxiety    Back fracture    Depression    Diabetes mellitus without complication (HCC)    Family history of adverse reaction to anesthesia    sister gets nausea and vomitting and itching on her face. Around her nose and mouth.   Fatty liver disease, nonalcoholic    GERD (gastroesophageal reflux disease)    H/O cesarean section complicating pregnancy 04/18/2014   Hypertension    Neuromuscular disorder (HCC)    eye twitch on left eye    Obesity    Pelvis fracture (HCC)    S/P cesarean section 06/01/2014   S/P tubal ligation 06/01/2014   Assessment: 42 yo W with RLE CLI s/p right femoral-popliteal embolectomy, 4 compartment fasciotomy on 4/25. No anticoagulation prior to admission. Pharmacy consulted for heparin.    Heparin was hel 4/25 17:20 to 2300 for procedure. Order was discontinued at 4/26 5:22am when orders were transferred from OR. Confirmed heparin was not actually stopped by RN. Discussed with PA, plan to continue heparin.   Heparin level < 0.1 is subtherapeutic on 1200 units/hr.  No issues with infusion or bleeding per RN.    Goal of Therapy:  Heparin level 0.3-0.7 units/ml Monitor platelets by anticoagulation protocol: Yes   Plan:   Increase Heparin infusion to 1450 units/hr F/u 8  hr heparin level Monitor daily heparin level, CBC Monitor for signs/symptoms of bleeding   Alphia Moh, PharmD, BCPS, BCCP Clinical Pharmacist  Please check AMION for all [facility] Main Pharmacy phone numbers After 10:00 PM, call Main Pharmacy [phone]

## 2022-05-08 NOTE — Progress Notes (Signed)
ANTICOAGULATION CONSULT NOTE - Follow-Up Consult  Pharmacy Consult for Heparin Indication: RLE arterial thrombus  Allergies  Allergen Reactions   Sulfa Antibiotics     Patient Measurements: Height: 5\' 1"  (154.9 cm) Weight: 81.2 kg (179 lb) IBW/kg (Calculated) : 47.8 Heparin Dosing Weight: 66 kg  Vital Signs: Temp: 98.3 F (36.8 C) (04/26 2037) Temp Source: Oral (04/26 2037) BP: 138/68 (04/26 2037) Pulse Rate: 97 (04/26 2037)  Labs: Recent Labs    05/07/22 0521 05/08/22 1028 05/08/22 2022  HGB 14.7 12.2  --   HCT 44.4 35.8*  --   PLT 267 276  --   HEPARINUNFRC  --  <0.10* <0.10*  CREATININE 0.58 0.50  --   CKTOTAL 69  --   --      Estimated Creatinine Clearance: 86.7 mL/min (by C-G formula based on SCr of 0.5 mg/dL).   Medical History: Past Medical History:  Diagnosis Date   Anxiety    Back fracture    Depression    Diabetes mellitus without complication (HCC)    Family history of adverse reaction to anesthesia    sister gets nausea and vomitting and itching on her face. Around her nose and mouth.   Fatty liver disease, nonalcoholic    GERD (gastroesophageal reflux disease)    H/O cesarean section complicating pregnancy 04/18/2014   Hypertension    Neuromuscular disorder (HCC)    eye twitch on left eye    Obesity    Pelvis fracture (HCC)    S/P cesarean section 06/01/2014   S/P tubal ligation 06/01/2014   Assessment: 42 yo W with RLE CLI s/p right femoral-popliteal embolectomy, 4 compartment fasciotomy on 4/25. No anticoagulation prior to admission. Pharmacy consulted for heparin.    Heparin was held 4/25 17:20 to 2300 for procedure. Order was discontinued at 4/26 5:22am when orders were transferred from OR. Confirmed heparin was not actually stopped by RN. Discussed with PA, plan to continue heparin.   Heparin level < 0.1 remains subtherapeutic despite heparin infusion rate increase to 1450 units/hr.  No issues with infusion or bleeding per RN. Verified  that heparin infusion is running and no issues with IV site.  Hgb 12.2, Plt 276  Goal of Therapy:  Heparin level 0.3-0.7 units/ml Monitor platelets by anticoagulation protocol: Yes   Plan:   Increase Heparin infusion to 1700 units/hr F/u 6 hr heparin level Monitor daily heparin level, CBC Monitor for signs/symptoms of bleeding    Wilburn Cornelia, PharmD, BCPS Clinical Pharmacist 05/08/2022 10:23 PM   Please refer to AMION for pharmacy phone number

## 2022-05-08 NOTE — Progress Notes (Addendum)
  Progress Note    05/08/2022 8:48 AM 1 Day Post-Op  Subjective:  feeling alright this morning. Pain at RLE fasciotomy sites    Vitals:   05/08/22 0455 05/08/22 0500  BP: 107/71 112/66  Pulse: 74 75  Resp: 18 19  Temp: 98.1 F (36.7 C)   SpO2: 98% 94%    Physical Exam: General:  no acute distress Cardiac:  regular Lungs:  nonlabored Incisions:  right groin incision intact with prevena. RLE fasciotomy sites intact with dry dressing Extremities:  palpable right DP/PT. Intact sensation of RLE. Can wiggle right toes and move the ankle  CBC    Component Value Date/Time   WBC 13.3 (H) 05/07/2022 0521   RBC 5.09 05/07/2022 0521   HGB 14.7 05/07/2022 0521   HCT 44.4 05/07/2022 0521   PLT 267 05/07/2022 0521   MCV 87.2 05/07/2022 0521   MCH 28.9 05/07/2022 0521   MCHC 33.1 05/07/2022 0521   RDW 13.2 05/07/2022 0521   LYMPHSABS 2.9 05/07/2022 0521   MONOABS 0.7 05/07/2022 0521   EOSABS 0.1 05/07/2022 0521   BASOSABS 0.0 05/07/2022 0521    BMET    Component Value Date/Time   NA 135 05/07/2022 0521   NA 137 08/06/2021 1613   K 4.5 05/07/2022 0521   CL 104 05/07/2022 0521   CO2 19 (L) 05/07/2022 0521   GLUCOSE 288 (H) 05/07/2022 0521   BUN 14 05/07/2022 0521   BUN 11 08/06/2021 1613   CREATININE 0.58 05/07/2022 0521   CALCIUM 8.5 (L) 05/07/2022 0521   GFRNONAA >60 05/07/2022 0521   GFRAA >60 07/11/2018 2016    INR    Component Value Date/Time   INR 1.0 10/09/2021 2033     Intake/Output Summary (Last 24 hours) at 05/08/2022 0848 Last data filed at 05/08/2022 0700 Gross per 24 hour  Intake 1218.62 ml  Output 2610 ml  Net -1391.38 ml      Assessment/Plan:  42 y.o. female is 1 day post op, s/p: right fem-pop embolectomy with 4 compartment fasciotomies  -Patient is doing well this morning. No more pain in the right thigh. Having some pain in the right calf fasciotomy sites -Right groin and RLE incisions intact and dry. Right groin with prevena, this will  stay on for 7 days. Continue dry dressing to RLE fasciotomies -Intact motor and sensation of RLE. Can wiggle toes and move the ankle. Palpable right DP/PT -Continue IV heparin. A-line can be removed -Appreciate PT/OT involvement. Patient can transfer from bed to chair today. If mobilization goes well today, can increase to walking tomorrow   Loel Dubonnet, PA-C [facility] [phone] 05/08/2022 8:48 AM  VASCULAR STAFF ADDENDUM: I have independently interviewed and examined the patient. I agree with the above.  OOB to chair. Continue heparin. Calf soft, appropriate pain.  Palpable pulse.  Needs echo and CTA chest   J. Gillis Santa, MD [facility] Phone Number: [phone] 05/08/2022 11:11 AM

## 2022-05-08 NOTE — Progress Notes (Signed)
PHARMACIST LIPID MONITORING   [name] [name] is a 42 y.o. female admitted on 05/07/2022 with RLE CLI.  Pharmacy has been consulted to optimize lipid-lowering therapy with the indication of secondary prevention for clinical ASCVD.  Recent Labs:  Lipid Panel (last 6 months):   Lab Results  Component Value Date   CHOL 138 05/08/2022   TRIG 347 (H) 05/08/2022   HDL 30 (L) 05/08/2022   CHOLHDL 4.6 05/08/2022   VLDL 69 (H) 05/08/2022   LDLCALC 39 05/08/2022    Hepatic function panel (last 6 months):   No results found for: "AST", "ALT", "ALKPHOS", "BILITOT", "BILIDIR", "IBILI"  SCr (since admission):   Serum creatinine: 0.58 mg/dL 16/10/96 0454 Estimated creatinine clearance: 86.7 mL/min  Current therapy and lipid therapy tolerance Current lipid-lowering therapy: none Previous lipid-lowering therapies (if applicable): n/a Documented or reported allergies or intolerances to lipid-lowering therapies (if applicable): no  Assessment:   Has never been on a cholesterol agent before and Patient agrees with changes to lipid-lowering therapy  Plan:    1.Statin intensity (high intensity recommended for all patients regardless of the LDL):  Add or increase statin to high intensity.  2.Add ezetimibe (if any one of the following):   Not indicated at this time.  3.Refer to lipid clinic:   No  4.Follow-up with:  Primary care provider - [facility]  5.Follow-up labs after discharge:  Changes in lipid therapy were made. Check a lipid panel in 8-12 weeks then annually.     Alphia Moh, PharmD, BCPS, BCCP Clinical Pharmacist  Please check AMION for all [facility] Pharmacy phone numbers After 10:00 PM, call Main Pharmacy [phone]

## 2022-05-08 NOTE — Evaluation (Signed)
Occupational Therapy Evaluation Patient Details Name: [name] [name] MRN: [id number] DOB: 05/26/1980 Today's Date: 05/08/2022   History of Present Illness 42 yo female admitted 4/25 with RLE pain and neuropathy,  RLE thrombus from the mid SFA. 4/25 s/p RLE fem pop BPG with 4 compartment fasciotomy and VAC placement. PMHx: MDD, T2DM, GERD, HTN, retinopathy, PTSD   Clinical Impression   Pt admitted for above dx, PTA patient reports being independent in bADLs/iADLs and ambulating no AD, living with parents and children. Pt currently limited by pain with functional mobility but willing to partake in therapy. Pt needing more assist for regular toilet STS d/t groin pain, ambulates with min guard assist to pain tolerance, did have 1 LOB needing min A to recover d/t sharp pain increase. Pt would benefit from continued acute skilled OT services to address above deficits and help transition to next level of care. Patient would benefit from post acute Home OT services to help maximize functional independence in natural environment      Recommendations for follow up therapy are one component of a multi-disciplinary discharge planning process, led by the attending physician.  Recommendations may be updated based on patient status, additional functional criteria and insurance authorization.   Assistance Recommended at Discharge Intermittent Supervision/Assistance  Patient can return home with the following A little help with walking and/or transfers;A little help with bathing/dressing/bathroom;Assistance with cooking/housework;Help with stairs or ramp for entrance;Assist for transportation    Functional Status Assessment  Patient has had a recent decline in their functional status and demonstrates the ability to make significant improvements in function in a reasonable and predictable amount of time.  Equipment Recommendations  BSC/3in1;Tub/shower seat    Recommendations for Other Services        Precautions / Restrictions Precautions Precautions: Fall Precaution Comments: R groin wound vac Restrictions Weight Bearing Restrictions: Yes RLE Weight Bearing: Weight bearing as tolerated      Mobility Bed Mobility Overal bed mobility: Needs Assistance Bed Mobility: Supine to Sit, Sit to Supine     Supine to sit: Supervision Sit to supine: Supervision        Transfers Overall transfer level: Needs assistance Equipment used: Rolling walker (2 wheels) Transfers: Sit to/from Stand Sit to Stand: Supervision                  Balance Overall balance assessment: Needs assistance Sitting-balance support: Feet supported Sitting balance-Leahy Scale: Good Sitting balance - Comments: Dynamic and static sitting balance good     Standing balance-Leahy Scale: Fair Standing balance comment: Able to stand statically w/o RW.                           ADL either performed or assessed with clinical judgement   ADL Overall ADL's : Needs assistance/impaired Eating/Feeding: Independent;Sitting   Grooming: Sitting;Supervision/safety   Upper Body Bathing: Sitting;Supervision/ safety;Set up   Lower Body Bathing: Set up;Supervison/ safety;Sitting/lateral leans   Upper Body Dressing : Sitting;Supervision/safety;Set up   Lower Body Dressing: Sitting/lateral leans;Supervision/safety Lower Body Dressing Details (indicate cue type and reason): Pt donned L socks and shoes sitting EOB. Max A to don R sock d/t pain Toilet Transfer: Min guard;Ambulation;Rolling walker (2 wheels) Toilet Transfer Details (indicate cue type and reason): STS from toilet 2+ min A d/t pain when sitting Toileting- Clothing Manipulation and Hygiene: Min guard;Sitting/lateral lean   Tub/ Shower Transfer: Min guard   Functional mobility during ADLs: Min guard;Rolling walker (2 wheels)  Vision         Perception     Praxis      Pertinent Vitals/Pain Pain Assessment Pain Assessment:  0-10 Pain Score: 8  Pain Location: R leg Pain Descriptors / Indicators: Aching, Discomfort, Sharp, Grimacing Pain Intervention(s): Limited activity within patient's tolerance, Monitored during session, Patient requesting pain meds-RN notified     Hand Dominance Right   Extremity/Trunk Assessment Upper Extremity Assessment Upper Extremity Assessment: Overall WFL for tasks assessed   Lower Extremity Assessment Lower Extremity Assessment: Overall WFL for tasks assessed;RLE deficits/detail RLE Deficits / Details: RLE pain with mobility, Pt reports return in sensation, tender to touch RLE: Unable to fully assess due to pain   Cervical / Trunk Assessment Cervical / Trunk Assessment: Normal   Communication Communication Communication: No difficulties   Cognition Arousal/Alertness: Awake/alert Behavior During Therapy: WFL for tasks assessed/performed Overall Cognitive Status: Within Functional Limits for tasks assessed                                       General Comments  VSS on RA. SPo2 at 92-94% during functional ambulation    Exercises     Shoulder Instructions      Home Living Family/patient expects to be discharged to:: Private residence Living Arrangements: Children;Parent (mother, daugther and kids) Available Help at Discharge: Family;Available 24 hours/day (mom is home  majority of day) Type of Home: Mobile home Home Access: Stairs to enter [employer] of Steps: 4 Entrance Stairs-Rails: Right;Left Home Layout: One level     Bathroom Shower/Tub: Walk-in shower;Tub/shower unit (typically uses tun/shower)   Bathroom Toilet: Standard Bathroom Accessibility: No   Home Equipment: Agricultural consultant (2 wheels);Hand held shower head   Additional Comments: Works as International aid/development worker at [employer]      Prior Functioning/Environment Prior Level of Function : Independent/Modified Independent;Driving;Working/employed             Mobility  Comments: indepdendent no AD ADLs Comments: indepedent in bADLs/iADLs        OT Problem List: Decreased activity tolerance;Impaired balance (sitting and/or standing);Pain      OT Treatment/Interventions: Self-care/ADL training;Energy conservation;Therapeutic exercise;Therapeutic activities;Patient/family education;DME and/or AE instruction;Balance training    OT Goals(Current goals can be found in the care plan section) Acute Rehab OT Goals Patient Stated Goal: To return home OT Goal Formulation: With patient Time For Goal Achievement: 05/22/22 Potential to Achieve Goals: Good ADL Goals Pt Will Perform Lower Body Dressing: sitting/lateral leans;with min assist Pt Will Transfer to Toilet: with supervision;ambulating Additional ADL Goal #1: Pt will demonstrate use of energy conservation techniques to manage pain following functional ambulation  OT Frequency: Min 2X/week    Co-evaluation              AM-PAC OT "6 Clicks" Daily Activity     Outcome Measure Help from another person eating meals?: None Help from another person taking care of personal grooming?: A Little Help from another person toileting, which includes using toliet, bedpan, or urinal?: A Little Help from another person bathing (including washing, rinsing, drying)?: A Little Help from another person to put on and taking off regular upper body clothing?: A Little Help from another person to put on and taking off regular lower body clothing?: A Lot 6 Click Score: 18   End of Session Equipment Utilized During Treatment: Rolling walker (2 wheels);Gait belt Nurse Communication: Mobility status  Activity Tolerance: Patient  tolerated treatment well Patient left: in bed;with nursing/sitter in room;with call bell/phone within reach  OT Visit Diagnosis: Unsteadiness on feet (R26.81);Pain;Muscle weakness (generalized) (M62.81) Pain - Right/Left: Right Pain - part of body: Leg;Ankle and joints of foot                 Time: 9147-8295 OT Time Calculation (min): 35 min Charges:  OT General Charges $OT Visit: 1 Visit OT Evaluation $OT Eval Moderate Complexity: 1 Mod OT Treatments $Therapeutic Activity: 8-22 mins  05/08/2022  AB, OTR/L  Acute Rehabilitation Services  Office: [phone]   [name] [name] 05/08/2022, 10:14 AM

## 2022-05-08 NOTE — Progress Notes (Signed)
PT Cancellation Note  Patient Details Name: EMMALYNNE COURTNEY MRN: [id number] DOB: 1980/09/01   Cancelled Treatment:    Reason Eval/Treat Not Completed: Patient at procedure or test/unavailable (attempted x 2, pt with OT and now leaving for CT)   Derrin Currey B Luisalberto Beegle 05/08/2022, 9:54 AM Merryl Hacker, PT Acute Rehabilitation Services Office: [phone]

## 2022-05-08 NOTE — Progress Notes (Signed)
Inpatient Diabetes Program Recommendations  AACE/ADA: New Consensus Statement on Inpatient Glycemic Control (2015)  Target Ranges:  Prepandial:   less than 140 mg/dL      Peak postprandial:   less than 180 mg/dL (1-2 hours)      Critically ill patients:  140 - 180 mg/dL   Lab Results  Component Value Date   GLUCAP 181 (H) 05/08/2022   HGBA1C 9.0 (H) 05/08/2022    Review of Glycemic Control  Latest Reference Range & Units 05/07/22 15:49 05/07/22 18:54 05/07/22 22:47 05/08/22 07:08  Glucose-Capillary 70 - 99 mg/dL 161 (H) 096 (H) 045 (H) 181 (H)   Diabetes history: DM 2 Outpatient Diabetes medications:  Semglee 28 units daily Metformin 500 mg bid Current orders for Inpatient glycemic control:  Novolog 0-15 units tid with meals Semglee 28 units q HS Metformin 500 mg bid  Inpatient Diabetes Program Recommendations:    Agree with orders.  Will follow.   Thanks,  Beryl Meager, RN, BC-ADM Inpatient Diabetes Coordinator Pager [phone]  (8a-5p)

## 2022-05-08 NOTE — Progress Notes (Signed)
Pt is transferred from PACU to 4E12. Alert and fully oriented x 4, afebrile, hemodynamically stable, NSR on the monitor. Right leg dressing with compression dressing and right groin with wound vac are negative for bleeding or hematoma. Pain is well controlled with Dilaudid and Percocet. Palpable pulses on DP and PT bilaterally.  Heparin gtt is restarted at 1200 units/hr per order. Pt has no acute distress at arrival. We will continue to monitor.    Filiberto Pinks, RN

## 2022-05-08 NOTE — TOC Benefit Eligibility Note (Signed)
Patient Product/process development scientist completed.    The patient is currently admitted and upon discharge could be taking Eliquis.  The current 30 day co-pay is $65.00.      Patient Product/process development scientist completed.    The patient is currently admitted and upon discharge could be taking Xarelto.  The current 30 day co-pay is $65.00.   The patient is insured through [employer]   This test claim was processed through [facility]- copay amounts may vary at other pharmacies due to pharmacy/plan contracts, or as the patient moves through the different stages of their insurance plan.

## 2022-05-09 ENCOUNTER — Other Ambulatory Visit (HOSPITAL_COMMUNITY): Payer: No Typology Code available for payment source

## 2022-05-09 ENCOUNTER — Inpatient Hospital Stay (HOSPITAL_COMMUNITY): Payer: No Typology Code available for payment source

## 2022-05-09 DIAGNOSIS — I2609 Other pulmonary embolism with acute cor pulmonale: Secondary | ICD-10-CM | POA: Diagnosis not present

## 2022-05-09 LAB — CBC
HCT: 37.8 % (ref 36.0–46.0)
Hemoglobin: 12.3 g/dL (ref 12.0–15.0)
MCH: 28.3 pg (ref 26.0–34.0)
MCHC: 32.5 g/dL (ref 30.0–36.0)
MCV: 87.1 fL (ref 80.0–100.0)
Platelets: 262 10*3/uL (ref 150–400)
RBC: 4.34 MIL/uL (ref 3.87–5.11)
RDW: 13.1 % (ref 11.5–15.5)
WBC: 11.8 10*3/uL — ABNORMAL HIGH (ref 4.0–10.5)
nRBC: 0 % (ref 0.0–0.2)

## 2022-05-09 LAB — ECHOCARDIOGRAM COMPLETE
AR max vel: 2.45 cm2
AV Area VTI: 2.41 cm2
AV Area mean vel: 2.34 cm2
AV Mean grad: 3 mmHg
AV Peak grad: 4.8 mmHg
Ao pk vel: 1.1 m/s
Area-P 1/2: 3.4 cm2
Height: 61 in
S' Lateral: 2.3 cm
Weight: 2864 oz

## 2022-05-09 LAB — GLUCOSE, CAPILLARY
Glucose-Capillary: 182 mg/dL — ABNORMAL HIGH (ref 70–99)
Glucose-Capillary: 186 mg/dL — ABNORMAL HIGH (ref 70–99)

## 2022-05-09 LAB — HEPARIN LEVEL (UNFRACTIONATED)
Heparin Unfractionated: 0.12 IU/mL — ABNORMAL LOW (ref 0.30–0.70)
Heparin Unfractionated: <0.1 IU/mL — ABNORMAL LOW (ref 0.30–0.70)

## 2022-05-09 MED ORDER — RIVAROXABAN 20 MG PO TABS: 20.0000 mg | ORAL_TABLET | Freq: Every day | ORAL | 3 refills | Status: DC

## 2022-05-09 MED ORDER — ASPIRIN 81 MG PO TBEC: 81.0000 mg | DELAYED_RELEASE_TABLET | Freq: Every day | ORAL | 12 refills | Status: AC

## 2022-05-09 MED ORDER — INSULIN GLARGINE-YFGN 100 UNIT/ML ~~LOC~~ SOPN: 28.0000 [IU] | PEN_INJECTOR | Freq: Every day | SUBCUTANEOUS | 2 refills | Status: DC

## 2022-05-09 MED ORDER — PERFLUTREN LIPID MICROSPHERE
1.0000 mL | INTRAVENOUS | Status: AC | PRN
  Administered 2022-05-09: 2 mL via INTRAVENOUS

## 2022-05-09 MED ORDER — OXYCODONE-ACETAMINOPHEN 5-325 MG PO TABS: 1.0000 | ORAL_TABLET | ORAL | 0 refills | Status: DC | PRN

## 2022-05-09 MED ORDER — RIVAROXABAN 20 MG PO TABS
20.0000 mg | ORAL_TABLET | Freq: Every day | ORAL | Status: DC
  Administered 2022-05-09: 20 mg via ORAL
  Filled 2022-05-09: qty 1

## 2022-05-09 MED ORDER — "PEN NEEDLES 3/16"" 31G X 5 MM MISC": 1.0000 | Freq: Every day | 0 refills | Status: DC

## 2022-05-09 MED ORDER — ROSUVASTATIN CALCIUM 20 MG PO TABS: 20.0000 mg | ORAL_TABLET | Freq: Every day | ORAL | 11 refills | Status: DC

## 2022-05-09 NOTE — Progress Notes (Signed)
Occupational Therapy Treatment Patient Details Name: [name] [name] MRN: [id number] DOB: 1980-12-27 Today's Date: 05/09/2022   History of present illness 42 yo female admitted 4/25 with RLE pain and neuropathy,  RLE thrombus from the mid SFA. 4/25 s/p RLE fem pop BPG with 4 compartment fasciotomy and VAC placement. PMHx: MDD, T2DM, GERD, HTN, retinopathy, PTSD   OT comments  Pt. Seen for skilled OT session.  Moving well with bed mobility and short in home distance to b.room.  agreeable that rw is a safer option for her vs. Furniture walking. States she will use it at home.  Reviewed energy conservation strategies related to adls at home. Pt. Verbalized understanding.  Eager for home when able.  Agree with current d/c recommendations.     Recommendations for follow up therapy are one component of a multi-disciplinary discharge planning process, led by the attending physician.  Recommendations may be updated based on patient status, additional functional criteria and insurance authorization.    Assistance Recommended at Discharge Intermittent Supervision/Assistance  Patient can return home with the following  A little help with walking and/or transfers;A little help with bathing/dressing/bathroom;Assistance with cooking/housework;Help with stairs or ramp for entrance;Assist for transportation   Equipment Recommendations  BSC/3in1;Tub/shower seat    Recommendations for Other Services      Precautions / Restrictions Precautions Precautions: Fall Precaution Comments: R groin wound vac Restrictions Weight Bearing Restrictions: Yes RLE Weight Bearing: Weight bearing as tolerated       Mobility Bed Mobility Overal bed mobility: Modified Independent                  Transfers Overall transfer level: Modified independent Equipment used: Rolling walker (2 wheels) Transfers: Sit to/from Stand Sit to Stand: Supervision                 Balance                                            ADL either performed or assessed with clinical judgement   ADL Overall ADL's : Needs assistance/impaired                         Toilet Transfer: Min guard;Ambulation;Rolling walker (2 wheels) Toilet Transfer Details (indicate cue type and reason): declined need for use so amb. from around bed to room door and back to bed         Functional mobility during ADLs: Min guard;Rolling walker (2 wheels) General ADL Comments: reviewed example of energy conservation related to adls-that bsc use at night might be easier and safer and then using 3n1 over the commode in b.room for during the day use.    Extremity/Trunk Assessment              Vision       Perception     Praxis      Cognition Arousal/Alertness: Awake/alert   Overall Cognitive Status: Within Functional Limits for tasks assessed                                          Exercises      Shoulder Instructions       General Comments      Pertinent Vitals/ Pain  Pain Assessment Pain Assessment: No/denies pain  Home Living                                          Prior Functioning/Environment              Frequency  Min 2X/week        Progress Toward Goals  OT Goals(current goals can now be found in the care plan section)  Progress towards OT goals: Progressing toward goals     Plan Discharge plan remains appropriate    Co-evaluation                 AM-PAC OT "6 Clicks" Daily Activity     Outcome Measure   Help from another person eating meals?: None Help from another person taking care of personal grooming?: A Little Help from another person toileting, which includes using toliet, bedpan, or urinal?: A Little Help from another person bathing (including washing, rinsing, drying)?: A Little Help from another person to put on and taking off regular upper body clothing?: A Little Help from another  person to put on and taking off regular lower body clothing?: A Lot 6 Click Score: 18    End of Session Equipment Utilized During Treatment: Rolling walker (2 wheels);Gait belt  OT Visit Diagnosis: Unsteadiness on feet (R26.81);Pain;Muscle weakness (generalized) (M62.81) Pain - Right/Left: Right Pain - part of body: Leg;Ankle and joints of foot   Activity Tolerance Patient tolerated treatment well   Patient Left in bed;with call bell/phone within reach   Nurse Communication Other (comment) (pt. requests that rn check RLE with a small amount of drainage from lower staple, rn present at end of session checking area for pt.)        Time: 1610-9604 OT Time Calculation (min): 14 min  Charges: OT General Charges $OT Visit: 1 Visit OT Treatments $Self Care/Home Management : 8-22 mins  Boneta Lucks, COTA/L Acute Rehabilitation [phone]   Alessandra Bevels Lorraine-COTA/L 05/09/2022, 12:09 PM

## 2022-05-09 NOTE — Progress Notes (Signed)
Mobility Specialist: Progress Note   05/09/22 1043  Mobility  Activity Ambulated with assistance in hallway  Level of Assistance Contact guard assist, steadying assist  Assistive Device Front wheel walker  Distance Ambulated (ft) 260 ft  RLE Weight Bearing WBAT  Activity Response Tolerated well  Mobility Referral Yes  $Mobility charge 1 Mobility   Pre-Mobility: 87 HR Post-Mobility: 93 HR, 92% SpO2  Pt received in the bed and agreeable to mobility. Mod I with bed mobility and contact guard during ambulation. C/o 8/10 RLE pain during ambulation, otherwise asymptomatic. Pt to the chair after session with call bell and phone in reach.   [name] [name] Mobility Specialist Please contact via SecureChat or Rehab office at [phone]

## 2022-05-09 NOTE — TOC Transition Note (Signed)
Transition of Care [facility]) - CM/SW Discharge Note   Patient Details  Name: [name] [name] MRN: [id number] Date of Birth: [birth year]  [facility] CM/SW Contact:  Tom-Johnson, Hershal Coria, RN Phone Number: 05/09/2022, 1:15 PM   Clinical Narrative:     Patient is scheduled for discharge today.  Readmission Prevention Assessment done.  Outpatient referral, hospital f/u and discharge instructions on AVS.  HHPT/OT recommended, patient declined, opted for outpatient and requested [facility], info on AVS. Patient declined DME recommended, states she has them at home.  Mother, Darel Hong to transport at discharge. No further TOC needs noted.    Final next level of care: OP Rehab Barriers to Discharge: Barriers Resolved   Patient Goals and CMS Choice CMS Medicare.gov Compare Post Acute Care list provided to:: Patient Choice offered to / list presented to : Patient  Discharge Placement                  Patient to be transferred to facility by: Mother Name of family member notified: Judy    Discharge Plan and Services Additional resources added to the After Visit Summary for                  DME Arranged: N/A DME Agency: NA         HH Agency: NA        Social Determinants of Health (SDOH) Interventions SDOH Screenings   Food Insecurity: No Food Insecurity (05/07/2022)  Housing: Low Risk  (05/07/2022)  Transportation Needs: No Transportation Needs (05/07/2022)  Utilities: Not At Risk (05/07/2022)  Alcohol Screen: Low Risk  (07/12/2018)  Depression (PHQ2-9): Low Risk  (02/09/2022)  Tobacco Use: High Risk (05/08/2022)     Readmission Risk Interventions    05/09/2022    1:14 PM  Readmission Risk Prevention Plan  Post Dischage Appt Complete  Medication Screening Complete  Transportation Screening Complete

## 2022-05-09 NOTE — Discharge Instructions (Addendum)
 [facility] [place]  Discharge instructions  Lower Extremity Surgery  Please refer to the following instruction for your post-procedure care. Your surgeon or physician assistant will discuss any changes with you.  Activity  You are encouraged to walk as much as you can. You can slowly return to normal activities during the month after your surgery. Avoid strenuous activity and heavy lifting until your doctor tells you it's OK. Avoid activities such as vacuuming or swinging a golf club. Do not drive until your doctor give the OK and you are no longer taking prescription pain medications. It is also normal to have difficulty with sleep habits, eating and bowel movement after surgery. These will go away with time.  Bathing/Showering  Shower daily after you go home. Do not soak in a bathtub, hot tub, or swim until the incision heals completely.  Incision Care  Clean your incision with mild soap and water. Shower every day. Pat the area dry with a clean towel. You do not need a bandage unless otherwise instructed. Do not apply any ointments or creams to your incision. If you have open wounds you will be instructed how to care for them or a visiting nurse may be arranged for you. If you have staples or sutures along your incision they will be removed at your post-op appointment. You may have skin glue on your incision. Do not peel it off. It will come off on its own in about one week.  Keep Pravena wound vac on your groin incision until it loses it seal in about 7-10 days.  Once that happens, you can remove and then wash the groin wound with soap and water daily and pat dry. (No tub bath-only shower)  Then put a dry gauze or washcloth in the groin to keep this area dry to help prevent wound infection.  Do this daily and as needed.  Do not use Vaseline or neosporin on your incisions.  Only use soap and water on your incisions and then protect and keep dry.   Diet  Resume  your normal diet. There are no special food restrictions following this procedure. A low fat/ low cholesterol diet is recommended for all patients with vascular disease. In order to heal from your surgery, it is CRITICAL to get adequate nutrition. Your body requires vitamins, minerals, and protein. Vegetables are the best source of vitamins and minerals. Vegetables also provide the perfect balance of protein. Processed food has little nutritional value, so try to avoid this.  Medications  Resume taking all your medications unless your doctor or physician assistant tells you not to. If your incision is causing pain, you may take over-the-counter pain relievers such as acetaminophen (Tylenol). If you were prescribed a stronger pain medication, please aware these medication can cause nausea and constipation. Prevent nausea by taking the medication with a snack or meal. Avoid constipation by drinking plenty of fluids and eating foods with high amount of fiber, such as fruits, vegetables, and grains. Take Colace 100 mg (an over-the-counter stool softener) twice a day as needed for constipation.   Do not take Tylenol if you are taking prescription pain medications.  Follow Up  Our office will schedule a follow up appointment 2-3 weeks following discharge.  Please call us immediately for any of the following conditions  Severe or worsening pain in your legs or feet while at rest or while walking Increase pain, redness, warmth, or drainage (pus) from your incision site(s) Fever of 101   degree or higher The swelling in your leg with the bypass suddenly worsens and becomes more painful than when you were in the hospital If you have been instructed to feel your graft pulse then you should do so every day. If you can no longer feel this pulse, call the office immediately. Not all patients are given this instruction.  Leg swelling is common after leg bypass surgery.  The swelling should improve over a few  months following surgery. To improve the swelling, you may elevate your legs above the level of your heart while you are sitting or resting. Your surgeon or physician assistant may ask you to apply an ACE wrap or wear compression (TED) stockings to help to reduce swelling.  Reduce your risk of vascular disease  Stop smoking. If you would like help call [facility] at 1-800-QUIT-NOW (7434794554) or [place] at [phone].  Manage your cholesterol Maintain a desired weight Control your diabetes weight Control your diabetes Keep your blood pressure down  If you have any questions, please call the office at [phone]  Information on my medicine - XARELTO (rivaroxaban)  This medication education was reviewed with me or my healthcare representative as part of my discharge preparation.  The pharmacist that spoke with me during my hospital stay was:  Marygrace Drought, [facility]  WHY WAS Carlena Hurl PRESCRIBED FOR YOU? Xarelto was prescribed to treat blood clots that may have been found in the veins of your legs (deep vein thrombosis) or in your lungs (pulmonary embolism) and to reduce the risk of them occurring again.  What do you need to know about Xarelto? The starting dose based on recommendations from your doctor is one 20 mg tablet taken ONCE A DAY with food  DO NOT stop taking Xarelto without talking to the health care provider who prescribed the medication.  Refill your prescription for 20 mg tablets before you run out.  After discharge, you should have regular check-up appointments with your healthcare provider that is prescribing your Xarelto.  In the future your dose may need to be changed if your kidney function changes by a significant amount.  What do you do if you miss a dose? If you are taking Xarelto TWICE DAILY and you miss a dose, take it as soon as you remember. You may take two 15 mg tablets (total 30 mg) at the same time then resume your regularly scheduled 15 mg twice  daily the next day.  If you are taking Xarelto ONCE DAILY and you miss a dose, take it as soon as you remember on the same day then continue your regularly scheduled once daily regimen the next day. Do not take two doses of Xarelto at the same time.   Important Safety Information Xarelto is a blood thinner medicine that can cause bleeding. You should call your healthcare provider right away if you experience any of the following: Bleeding from an injury or your nose that does not stop. Unusual colored urine (red or dark brown) or unusual colored stools (red or black). Unusual bruising for unknown reasons. A serious fall or if you hit your head (even if there is no bleeding).  Some medicines may interact with Xarelto and might increase your risk of bleeding while on Xarelto. To help avoid this, consult your healthcare provider or pharmacist prior to using any new prescription or non-prescription medications, including herbals, vitamins, non-steroidal anti-inflammatory drugs (NSAIDs) and supplements.  This website has more information on Xarelto: VisitDestination.com.br.

## 2022-05-09 NOTE — Progress Notes (Addendum)
  Progress Note    05/09/2022 8:51 AM 2 Days Post-Op  Subjective:  Right groin and leg sore. Says she ambulated in hallway yesterday and has been ambulating in room. Wants to go home   Vitals:   05/08/22 2344 05/09/22 0848  BP: 126/69 123/67  Pulse: 100 89  Resp: 20 18  Temp: 98.4 F (36.9 C) 98.5 F (36.9 C)  SpO2: 92% 94%   Physical Exam: Cardiac:  regular Lungs:  non labored Incisions:  right groin with prevena vac with good seal. Right leg fasciotomy sites with staples intact, clean and dry Extremities:  RLE well perfused and warm with Palpable Dp / PT pulses. Mild edema of right leg. Right calf soft Abdomen:  soft Neurologic: alert and oriented  CBC    Component Value Date/Time   WBC 11.8 (H) 05/09/2022 0135   RBC 4.34 05/09/2022 0135   HGB 12.3 05/09/2022 0135   HCT 37.8 05/09/2022 0135   PLT 262 05/09/2022 0135   MCV 87.1 05/09/2022 0135   MCH 28.3 05/09/2022 0135   MCHC 32.5 05/09/2022 0135   RDW 13.1 05/09/2022 0135   LYMPHSABS 2.9 05/07/2022 0521   MONOABS 0.7 05/07/2022 0521   EOSABS 0.1 05/07/2022 0521   BASOSABS 0.0 05/07/2022 0521    BMET    Component Value Date/Time   NA 133 (L) 05/08/2022 1028   NA 137 08/06/2021 1613   K 3.5 05/08/2022 1028   CL 100 05/08/2022 1028   CO2 25 05/08/2022 1028   GLUCOSE 178 (H) 05/08/2022 1028   BUN 5 (L) 05/08/2022 1028   BUN 11 08/06/2021 1613   CREATININE 0.50 05/08/2022 1028   CALCIUM 8.4 (L) 05/08/2022 1028   GFRNONAA >60 05/08/2022 1028   GFRAA >60 07/11/2018 2016    INR    Component Value Date/Time   INR 1.0 10/09/2021 2033    No intake or output data in the 24 hours ending 05/09/22 0851   Assessment/Plan:  42 y.o. female is s/p right fem-pop embolectomy with 4 compartment fasciotomies  2 Days Post-Op   RLE remains well perfused and warm with palpable Dp/PT pulses Right groin with prevena VAC Right leg fasciotomy sites c/d/I with staples Encouraged her to mobilize as able Elevate right  leg to help with swelling H&H stable Cleared by PT. OT recommended home OT but patient feels okay to d/c without She has all home DME needs CTA did not show any concerning findings or evidence of embolic source Currently on Heparin gtt. Transition to Xarelto She remains stable for discharge this afternoon once her Echo is complete She will d/c with Prevena which will remain in place for 7-10 days She will have follow up arranged in our office in 2-3 weeks for staple removal   Graceann Congress, PA-C [facility] [phone] 05/09/2022 8:51 AM  VASCULAR STAFF ADDENDUM: I have independently interviewed and examined the patient. I agree with the above.  OK for discharge once echocardiogram complete. DC on ASA / Xarelto. Follow up as above.  Rande Brunt. Lenell Antu, MD [facility] Phone Number: [phone] 05/09/2022 9:30 AM

## 2022-05-09 NOTE — Progress Notes (Signed)
ANTICOAGULATION CONSULT NOTE - Follow-up Consult  Pharmacy Consult for Heparin Indication: RLE arterial thrombus  Allergies  Allergen Reactions   Sulfa Antibiotics     Patient Measurements: Height: 5\' 1"  (154.9 cm) Weight: 81.2 kg (179 lb) IBW/kg (Calculated) : 47.8 Heparin Dosing Weight: 66 kg  Vital Signs: Temp: 98.5 F (36.9 C) (04/27 0848) Temp Source: Oral (04/27 0848) BP: 123/67 (04/27 0848) Pulse Rate: 89 (04/27 0848)  Labs: Recent Labs    05/07/22 0521 05/07/22 0521 05/08/22 1028 05/08/22 2022 05/09/22 0135 05/09/22 0752  HGB 14.7  --  12.2  --  12.3  --   HCT 44.4  --  35.8*  --  37.8  --   PLT 267  --  276  --  262  --   HEPARINUNFRC  --    < > <0.10* <0.10* 0.12* <0.10*  CREATININE 0.58  --  0.50  --   --   --   CKTOTAL 69  --   --   --   --   --    < > = values in this interval not displayed.     Estimated Creatinine Clearance: 86.7 mL/min (by C-G formula based on SCr of 0.5 mg/dL).   Medical History: Past Medical History:  Diagnosis Date   Anxiety    Back fracture    Depression    Diabetes mellitus without complication (HCC)    Family history of adverse reaction to anesthesia    sister gets nausea and vomitting and itching on her face. Around her nose and mouth.   Fatty liver disease, nonalcoholic    GERD (gastroesophageal reflux disease)    H/O cesarean section complicating pregnancy 04/18/2014   Hypertension    Neuromuscular disorder (HCC)    eye twitch on left eye    Obesity    Pelvis fracture (HCC)    S/P cesarean section 06/01/2014   S/P tubal ligation 06/01/2014   Assessment: 42 yo [name] with RLE CLI s/p right femoral-popliteal embolectomy, 4 compartment fasciotomy on 4/25. No anticoagulation prior to admission. Pharmacy consulted to transition from heparin to Xarelto.  Heparin level has been subtherapeutic. CBC stable. Spoke with [facility] who would prefer no Xarelto load. Normal renal function.  Goal of Therapy:  Heparin level 0.3-0.7  units/ml Monitor platelets by anticoagulation protocol: Yes   Plan:   Stop heparin Start Xarelto 20 mg daily with food  Thank you for involving pharmacy in this patient's care.  Enos Fling, PharmD PGY2 Pharmacy Resident 05/09/2022 9:34 AM   Please check AMION for all [facility] Pharmacy phone numbers After 10:00 PM, call Main Pharmacy [phone]

## 2022-05-10 LAB — POCT ACTIVATED CLOTTING TIME
Activated Clotting Time: 152 seconds
Activated Clotting Time: 244 seconds
Activated Clotting Time: 249 seconds

## 2022-05-11 ENCOUNTER — Telehealth: Payer: Self-pay

## 2022-05-11 LAB — SURGICAL PATHOLOGY

## 2022-05-11 NOTE — Transitions of Care (Post Inpatient/ED Visit) (Signed)
05/11/2022  Name: JANEANN PETTRY MRN: [id number] DOB: [birth year]  Today's TOC FU Call Status: Today's TOC FU Call Status:: Successful TOC FU Call Competed TOC FU Call Complete Date: 05/11/22  Transition Care Management Follow-up Telephone Call Date of Discharge: 05/09/22 Discharge Facility: [facility]) Type of Discharge: Inpatient Admission How have you been since you were released from the hospital?: Better Any questions or concerns?: No  Items Reviewed: Did you receive and understand the discharge instructions provided?: Yes Medications obtained and verified?: Yes (Medications Reviewed) Any new allergies since your discharge?: No Dietary orders reviewed?: NA Do you have support at home?: Yes People in Home: significant other  Home Care and Equipment/Supplies: Were Home Health Services Ordered?: NA Any new equipment or medical supplies ordered?: NA  Functional Questionnaire: Do you need assistance with bathing/showering or dressing?: No Do you need assistance with meal preparation?: No Do you need assistance with eating?: No Do you have difficulty maintaining continence: No Do you need assistance with getting out of bed/getting out of a chair/moving?: No Do you have difficulty managing or taking your medications?: No  Follow up appointments reviewed: PCP Follow-up appointment confirmed?: No MD Provider Line [phone] Given: No Specialist Hospital Follow-up appointment confirmed?: No Reason Specialist Follow-Up Not Confirmed: Patient has Specialist Provider Number and will Call for Appointment Do you need transportation to your follow-up appointment?: No Do you understand care options if your condition(s) worsen?: Yes-patient verbalized understanding    SIGNATURE  Kandis Cocking, CMA (AAMA)  [facility] [phone]

## 2022-05-13 ENCOUNTER — Encounter (HOSPITAL_COMMUNITY): Payer: Self-pay

## 2022-05-13 ENCOUNTER — Emergency Department (HOSPITAL_COMMUNITY): Payer: No Typology Code available for payment source

## 2022-05-13 ENCOUNTER — Observation Stay (HOSPITAL_COMMUNITY)
Admission: EM | Admit: 2022-05-13 | Discharge: 2022-05-14 | Disposition: A | Discharge status: Home / Self Care | Payer: No Typology Code available for payment source | Attending: Surgery | Admitting: Surgery

## 2022-05-13 ENCOUNTER — Encounter (HOSPITAL_COMMUNITY)
Admission: EM | Disposition: A | Discharge status: Home / Self Care | Payer: Self-pay | Source: Home / Self Care | Attending: Emergency Medicine

## 2022-05-13 ENCOUNTER — Emergency Department (HOSPITAL_COMMUNITY): Payer: No Typology Code available for payment source | Admitting: Anesthesiology

## 2022-05-13 ENCOUNTER — Other Ambulatory Visit: Payer: Self-pay

## 2022-05-13 DIAGNOSIS — X58XXXA Exposure to other specified factors, initial encounter: Secondary | ICD-10-CM | POA: Diagnosis not present

## 2022-05-13 DIAGNOSIS — I10 Essential (primary) hypertension: Secondary | ICD-10-CM

## 2022-05-13 DIAGNOSIS — F17210 Nicotine dependence, cigarettes, uncomplicated: Secondary | ICD-10-CM | POA: Diagnosis not present

## 2022-05-13 DIAGNOSIS — Z833 Family history of diabetes mellitus: Secondary | ICD-10-CM | POA: Diagnosis not present

## 2022-05-13 DIAGNOSIS — E119 Type 2 diabetes mellitus without complications: Secondary | ICD-10-CM | POA: Diagnosis not present

## 2022-05-13 DIAGNOSIS — F419 Anxiety disorder, unspecified: Secondary | ICD-10-CM | POA: Diagnosis present

## 2022-05-13 DIAGNOSIS — F32A Depression, unspecified: Secondary | ICD-10-CM | POA: Diagnosis present

## 2022-05-13 DIAGNOSIS — Z7982 Long term (current) use of aspirin: Secondary | ICD-10-CM | POA: Diagnosis not present

## 2022-05-13 DIAGNOSIS — K760 Fatty (change of) liver, not elsewhere classified: Secondary | ICD-10-CM | POA: Diagnosis present

## 2022-05-13 DIAGNOSIS — Z7901 Long term (current) use of anticoagulants: Secondary | ICD-10-CM | POA: Insufficient documentation

## 2022-05-13 DIAGNOSIS — D6832 Hemorrhagic disorder due to extrinsic circulating anticoagulants: Secondary | ICD-10-CM | POA: Diagnosis present

## 2022-05-13 DIAGNOSIS — K219 Gastro-esophageal reflux disease without esophagitis: Secondary | ICD-10-CM | POA: Diagnosis present

## 2022-05-13 DIAGNOSIS — E1151 Type 2 diabetes mellitus with diabetic peripheral angiopathy without gangrene: Secondary | ICD-10-CM | POA: Diagnosis not present

## 2022-05-13 DIAGNOSIS — Z79899 Other long term (current) drug therapy: Secondary | ICD-10-CM | POA: Insufficient documentation

## 2022-05-13 DIAGNOSIS — L7632 Postprocedural hematoma of skin and subcutaneous tissue following other procedure: Principal | ICD-10-CM | POA: Diagnosis present

## 2022-05-13 DIAGNOSIS — Y838 Other surgical procedures as the cause of abnormal reaction of the patient, or of later complication, without mention of misadventure at the time of the procedure: Secondary | ICD-10-CM | POA: Diagnosis present

## 2022-05-13 DIAGNOSIS — Z7984 Long term (current) use of oral hypoglycemic drugs: Secondary | ICD-10-CM | POA: Diagnosis not present

## 2022-05-13 DIAGNOSIS — I97638 Postprocedural hematoma of a circulatory system organ or structure following other circulatory system procedure: Principal | ICD-10-CM | POA: Insufficient documentation

## 2022-05-13 DIAGNOSIS — R58 Hemorrhage, not elsewhere classified: Secondary | ICD-10-CM | POA: Diagnosis present

## 2022-05-13 DIAGNOSIS — Z794 Long term (current) use of insulin: Secondary | ICD-10-CM | POA: Insufficient documentation

## 2022-05-13 DIAGNOSIS — S7011XA Contusion of right thigh, initial encounter: Secondary | ICD-10-CM | POA: Diagnosis present

## 2022-05-13 DIAGNOSIS — T45515A Adverse effect of anticoagulants, initial encounter: Secondary | ICD-10-CM | POA: Diagnosis present

## 2022-05-13 HISTORY — PX: APPLICATION OF WOUND VAC: SHX5189

## 2022-05-13 HISTORY — PX: FEMORAL ARTERY EXPLORATION: SHX5160

## 2022-05-13 LAB — COMPREHENSIVE METABOLIC PANEL
ALT: 15 U/L (ref 0–44)
AST: 15 U/L (ref 15–41)
Albumin: 3.6 g/dL (ref 3.5–5.0)
Alkaline Phosphatase: 75 U/L (ref 38–126)
Anion gap: 11 (ref 5–15)
BUN: 15 mg/dL (ref 6–20)
CO2: 23 mmol/L (ref 22–32)
Calcium: 9.1 mg/dL (ref 8.9–10.3)
Chloride: 97 mmol/L — ABNORMAL LOW (ref 98–111)
Creatinine, Ser: 0.65 mg/dL (ref 0.44–1.00)
GFR, Estimated: >60 mL/min (ref >60)
Glucose, Bld: 292 mg/dL — ABNORMAL HIGH (ref 70–99)
Potassium: 4.8 mmol/L (ref 3.5–5.1)
Sodium: 131 mmol/L — ABNORMAL LOW (ref 135–145)
Total Bilirubin: 0.3 mg/dL (ref 0.3–1.2)
Total Protein: 7.3 g/dL (ref 6.5–8.1)

## 2022-05-13 LAB — CBC
HCT: 43.4 % (ref 36.0–46.0)
HCT: 35.5 % — ABNORMAL LOW (ref 36.0–46.0)
Hemoglobin: 14.9 g/dL (ref 12.0–15.0)
Hemoglobin: 11.9 g/dL — ABNORMAL LOW (ref 12.0–15.0)
MCH: 28.9 pg (ref 26.0–34.0)
MCH: 28.8 pg (ref 26.0–34.0)
MCHC: 34.3 g/dL (ref 30.0–36.0)
MCHC: 33.5 g/dL (ref 30.0–36.0)
MCV: 84.3 fL (ref 80.0–100.0)
MCV: 86 fL (ref 80.0–100.0)
Platelets: 443 10*3/uL — ABNORMAL HIGH (ref 150–400)
Platelets: 337 10*3/uL (ref 150–400)
RBC: 5.15 MIL/uL — ABNORMAL HIGH (ref 3.87–5.11)
RBC: 4.13 MIL/uL (ref 3.87–5.11)
RDW: 12.7 % (ref 11.5–15.5)
RDW: 13 % (ref 11.5–15.5)
WBC: 14.2 10*3/uL — ABNORMAL HIGH (ref 4.0–10.5)
WBC: 13.3 10*3/uL — ABNORMAL HIGH (ref 4.0–10.5)
nRBC: 0 % (ref 0.0–0.2)
nRBC: 0 % (ref 0.0–0.2)

## 2022-05-13 LAB — TYPE AND SCREEN
ABO/RH(D): O POS
Antibody Screen: NEGATIVE

## 2022-05-13 LAB — GLUCOSE, CAPILLARY
Glucose-Capillary: 227 mg/dL — ABNORMAL HIGH (ref 70–99)
Glucose-Capillary: 161 mg/dL — ABNORMAL HIGH (ref 70–99)
Glucose-Capillary: 266 mg/dL — ABNORMAL HIGH (ref 70–99)

## 2022-05-13 LAB — I-STAT BETA HCG BLOOD, ED (MC, WL, AP ONLY): I-stat hCG, quantitative: <5 m[IU]/mL (ref <5)

## 2022-05-13 LAB — CREATININE, SERUM
Creatinine, Ser: 0.5 mg/dL (ref 0.44–1.00)
GFR, Estimated: >60 mL/min (ref >60)

## 2022-05-13 SURGERY — EXPLORATION, ARTERY, FEMORAL
Anesthesia: General | Site: Groin | Laterality: Right

## 2022-05-13 MED ORDER — ACETAMINOPHEN 325 MG PO TABS
325.0000 mg | ORAL_TABLET | ORAL | Status: DC | PRN
  Administered 2022-05-14: 650 mg via ORAL
  Filled 2022-05-13: qty 2

## 2022-05-13 MED ORDER — MAGNESIUM SULFATE 2 GM/50ML IV SOLN: 2.0000 g | Freq: Every day | INTRAVENOUS | Status: DC | PRN

## 2022-05-13 MED ORDER — HEMOSTATIC AGENTS (NO CHARGE) OPTIME
TOPICAL | Status: DC | PRN
  Administered 2022-05-13: 1 via TOPICAL

## 2022-05-13 MED ORDER — ROCURONIUM BROMIDE 10 MG/ML (PF) SYRINGE
PREFILLED_SYRINGE | INTRAVENOUS | Status: DC | PRN
  Administered 2022-05-13: 40 mg via INTRAVENOUS

## 2022-05-13 MED ORDER — ONDANSETRON HCL 4 MG/2ML IJ SOLN
INTRAMUSCULAR | Status: DC | PRN
  Administered 2022-05-13: 4 mg via INTRAVENOUS

## 2022-05-13 MED ORDER — LISINOPRIL 20 MG PO TABS
40.0000 mg | ORAL_TABLET | Freq: Every day | ORAL | Status: DC
  Administered 2022-05-14: 40 mg via ORAL
  Filled 2022-05-13: qty 2

## 2022-05-13 MED ORDER — HYDROMORPHONE HCL 1 MG/ML IJ SOLN
1.0000 mg | Freq: Once | INTRAMUSCULAR | Status: AC
  Administered 2022-05-13: 1 mg via INTRAVENOUS
  Filled 2022-05-13: qty 1

## 2022-05-13 MED ORDER — PANTOPRAZOLE SODIUM 40 MG PO TBEC
40.0000 mg | DELAYED_RELEASE_TABLET | Freq: Every day | ORAL | Status: DC
  Administered 2022-05-13 – 2022-05-14 (×2): 40 mg via ORAL
  Filled 2022-05-13 (×2): qty 1

## 2022-05-13 MED ORDER — ACETAMINOPHEN 10 MG/ML IV SOLN
INTRAVENOUS | Status: DC | PRN
  Administered 2022-05-13: 1000 mg via INTRAVENOUS

## 2022-05-13 MED ORDER — ONDANSETRON HCL 4 MG/2ML IJ SOLN: 4.0000 mg | Freq: Four times a day (QID) | INTRAMUSCULAR | Status: DC | PRN

## 2022-05-13 MED ORDER — INSULIN ASPART 100 UNIT/ML IJ SOLN
0.0000 [IU] | INTRAMUSCULAR | Status: DC | PRN
  Administered 2022-05-13: 6 [IU] via SUBCUTANEOUS
  Filled 2022-05-13: qty 1

## 2022-05-13 MED ORDER — PHENYLEPHRINE 80 MCG/ML (10ML) SYRINGE FOR IV PUSH (FOR BLOOD PRESSURE SUPPORT)
PREFILLED_SYRINGE | INTRAVENOUS | Status: DC | PRN
  Administered 2022-05-13: 160 ug via INTRAVENOUS
  Administered 2022-05-13: 240 ug via INTRAVENOUS
  Administered 2022-05-13: 80 ug via INTRAVENOUS
  Administered 2022-05-13: 160 ug via INTRAVENOUS
  Administered 2022-05-13: 240 ug via INTRAVENOUS
  Administered 2022-05-13 (×2): 160 ug via INTRAVENOUS

## 2022-05-13 MED ORDER — GLYCOPYRROLATE PF 0.2 MG/ML IJ SOSY
PREFILLED_SYRINGE | INTRAMUSCULAR | Status: AC
  Filled 2022-05-13: qty 1

## 2022-05-13 MED ORDER — DOCUSATE SODIUM 100 MG PO CAPS
100.0000 mg | ORAL_CAPSULE | Freq: Every day | ORAL | Status: DC
  Administered 2022-05-14: 100 mg via ORAL
  Filled 2022-05-13: qty 1

## 2022-05-13 MED ORDER — LABETALOL HCL 5 MG/ML IV SOLN: 10.0000 mg | INTRAVENOUS | Status: DC | PRN

## 2022-05-13 MED ORDER — SUGAMMADEX SODIUM 200 MG/2ML IV SOLN
INTRAVENOUS | Status: DC | PRN
  Administered 2022-05-13: 200 mg via INTRAVENOUS

## 2022-05-13 MED ORDER — SODIUM CHLORIDE 0.9 % IV SOLN: 500.0000 mL | Freq: Once | INTRAVENOUS | Status: DC | PRN

## 2022-05-13 MED ORDER — HYDROMORPHONE HCL 1 MG/ML IJ SOLN
0.5000 mg | INTRAMUSCULAR | Status: DC | PRN
  Administered 2022-05-13: 1 mg via INTRAVENOUS
  Filled 2022-05-13: qty 1

## 2022-05-13 MED ORDER — 0.9 % SODIUM CHLORIDE (POUR BTL) OPTIME
TOPICAL | Status: DC | PRN
  Administered 2022-05-13: 1000 mL

## 2022-05-13 MED ORDER — CEFAZOLIN SODIUM-DEXTROSE 2-4 GM/100ML-% IV SOLN
2.0000 g | Freq: Three times a day (TID) | INTRAVENOUS | Status: AC
  Administered 2022-05-14 (×2): 2 g via INTRAVENOUS
  Filled 2022-05-13 (×2): qty 100

## 2022-05-13 MED ORDER — CHLORHEXIDINE GLUCONATE 0.12 % MT SOLN: 15.0000 mL | Freq: Once | OROMUCOSAL | Status: AC

## 2022-05-13 MED ORDER — PROPOFOL 10 MG/ML IV BOLUS
INTRAVENOUS | Status: AC
  Filled 2022-05-13: qty 20

## 2022-05-13 MED ORDER — ROCURONIUM BROMIDE 10 MG/ML (PF) SYRINGE
PREFILLED_SYRINGE | INTRAVENOUS | Status: AC
  Filled 2022-05-13: qty 10

## 2022-05-13 MED ORDER — HYDROMORPHONE HCL 1 MG/ML IJ SOLN: 0.2500 mg | INTRAMUSCULAR | Status: DC | PRN

## 2022-05-13 MED ORDER — HEPARIN SODIUM (PORCINE) 5000 UNIT/ML IJ SOLN
5000.0000 [IU] | Freq: Three times a day (TID) | INTRAMUSCULAR | Status: DC
  Administered 2022-05-14: 5000 [IU] via SUBCUTANEOUS
  Filled 2022-05-13: qty 1

## 2022-05-13 MED ORDER — MIDAZOLAM HCL 2 MG/2ML IJ SOLN
INTRAMUSCULAR | Status: AC
  Filled 2022-05-13: qty 2

## 2022-05-13 MED ORDER — EPHEDRINE 5 MG/ML INJ
INTRAVENOUS | Status: AC
  Filled 2022-05-13: qty 5

## 2022-05-13 MED ORDER — ALBUMIN HUMAN 5 % IV SOLN: INTRAVENOUS | Status: DC | PRN

## 2022-05-13 MED ORDER — HYDRALAZINE HCL 20 MG/ML IJ SOLN: 5.0000 mg | INTRAMUSCULAR | Status: DC | PRN

## 2022-05-13 MED ORDER — ACETAMINOPHEN 10 MG/ML IV SOLN
INTRAVENOUS | Status: AC
  Filled 2022-05-13: qty 100

## 2022-05-13 MED ORDER — LIDOCAINE 2% (20 MG/ML) 5 ML SYRINGE
INTRAMUSCULAR | Status: AC
  Filled 2022-05-13: qty 10

## 2022-05-13 MED ORDER — FENTANYL CITRATE (PF) 250 MCG/5ML IJ SOLN
INTRAMUSCULAR | Status: AC
  Filled 2022-05-13: qty 5

## 2022-05-13 MED ORDER — METOPROLOL TARTRATE 5 MG/5ML IV SOLN: 2.0000 mg | INTRAVENOUS | Status: DC | PRN

## 2022-05-13 MED ORDER — DEXAMETHASONE SODIUM PHOSPHATE 10 MG/ML IJ SOLN
INTRAMUSCULAR | Status: DC | PRN
  Administered 2022-05-13: 5 mg via INTRAVENOUS

## 2022-05-13 MED ORDER — ROSUVASTATIN CALCIUM 20 MG PO TABS
20.0000 mg | ORAL_TABLET | Freq: Every day | ORAL | Status: DC
  Administered 2022-05-14: 20 mg via ORAL
  Filled 2022-05-13: qty 1

## 2022-05-13 MED ORDER — ALUM & MAG HYDROXIDE-SIMETH 200-200-20 MG/5ML PO SUSP: 15.0000 mL | ORAL | Status: DC | PRN

## 2022-05-13 MED ORDER — FENTANYL CITRATE PF 50 MCG/ML IJ SOSY
50.0000 ug | PREFILLED_SYRINGE | Freq: Once | INTRAMUSCULAR | Status: AC
  Administered 2022-05-13: 50 ug via INTRAVENOUS
  Filled 2022-05-13: qty 1

## 2022-05-13 MED ORDER — SENNOSIDES-DOCUSATE SODIUM 8.6-50 MG PO TABS: 1.0000 | ORAL_TABLET | Freq: Every evening | ORAL | Status: DC | PRN

## 2022-05-13 MED ORDER — CEFAZOLIN SODIUM-DEXTROSE 2-3 GM-%(50ML) IV SOLR
INTRAVENOUS | Status: DC | PRN
  Administered 2022-05-13: 2 g via INTRAVENOUS

## 2022-05-13 MED ORDER — GABAPENTIN 300 MG PO CAPS
300.0000 mg | ORAL_CAPSULE | Freq: Three times a day (TID) | ORAL | Status: DC
  Administered 2022-05-13 – 2022-05-14 (×2): 300 mg via ORAL
  Filled 2022-05-13 (×2): qty 1

## 2022-05-13 MED ORDER — METFORMIN HCL ER 500 MG PO TB24: 500.0000 mg | ORAL_TABLET | Freq: Two times a day (BID) | ORAL | Status: DC

## 2022-05-13 MED ORDER — IOHEXOL 350 MG/ML SOLN
100.0000 mL | Freq: Once | INTRAVENOUS | Status: AC | PRN
  Administered 2022-05-13: 100 mL via INTRAVENOUS

## 2022-05-13 MED ORDER — CEFAZOLIN SODIUM-DEXTROSE 2-4 GM/100ML-% IV SOLN
INTRAVENOUS | Status: AC
  Filled 2022-05-13: qty 100

## 2022-05-13 MED ORDER — EPHEDRINE SULFATE-NACL 50-0.9 MG/10ML-% IV SOSY
PREFILLED_SYRINGE | INTRAVENOUS | Status: DC | PRN
  Administered 2022-05-13 (×3): 5 mg via INTRAVENOUS

## 2022-05-13 MED ORDER — PHENYLEPHRINE 80 MCG/ML (10ML) SYRINGE FOR IV PUSH (FOR BLOOD PRESSURE SUPPORT)
PREFILLED_SYRINGE | INTRAVENOUS | Status: AC
  Filled 2022-05-13: qty 30

## 2022-05-13 MED ORDER — LIDOCAINE 2% (20 MG/ML) 5 ML SYRINGE
INTRAMUSCULAR | Status: DC | PRN
  Administered 2022-05-13: 40 mg via INTRAVENOUS

## 2022-05-13 MED ORDER — SODIUM CHLORIDE 0.9 % IV SOLN: INTRAVENOUS | Status: DC | PRN

## 2022-05-13 MED ORDER — PROPOFOL 10 MG/ML IV BOLUS
INTRAVENOUS | Status: DC | PRN
  Administered 2022-05-13: 100 mg via INTRAVENOUS

## 2022-05-13 MED ORDER — ATROPINE SULFATE 0.4 MG/ML IV SOLN
INTRAVENOUS | Status: AC
  Filled 2022-05-13: qty 1

## 2022-05-13 MED ORDER — ASPIRIN 81 MG PO TBEC
81.0000 mg | DELAYED_RELEASE_TABLET | Freq: Every day | ORAL | Status: DC
  Administered 2022-05-14: 81 mg via ORAL
  Filled 2022-05-13: qty 1

## 2022-05-13 MED ORDER — ONDANSETRON HCL 4 MG/2ML IJ SOLN
INTRAMUSCULAR | Status: AC
  Filled 2022-05-13: qty 4

## 2022-05-13 MED ORDER — HEPARIN 6000 UNIT IRRIGATION SOLUTION
Status: AC
  Filled 2022-05-13: qty 500

## 2022-05-13 MED ORDER — ONDANSETRON HCL 4 MG/2ML IJ SOLN: 4.0000 mg | Freq: Once | INTRAMUSCULAR | Status: DC | PRN

## 2022-05-13 MED ORDER — OXYCODONE-ACETAMINOPHEN 5-325 MG PO TABS
1.0000 | ORAL_TABLET | ORAL | Status: DC | PRN
  Administered 2022-05-14: 2 via ORAL
  Filled 2022-05-13 (×2): qty 2

## 2022-05-13 MED ORDER — SODIUM CHLORIDE 0.9 % IV SOLN: INTRAVENOUS | Status: DC

## 2022-05-13 MED ORDER — DEXAMETHASONE SODIUM PHOSPHATE 10 MG/ML IJ SOLN
INTRAMUSCULAR | Status: AC
  Filled 2022-05-13: qty 1

## 2022-05-13 MED ORDER — MIDAZOLAM HCL 2 MG/2ML IJ SOLN
INTRAMUSCULAR | Status: DC | PRN
  Administered 2022-05-13: 2 mg via INTRAVENOUS

## 2022-05-13 MED ORDER — DEXMEDETOMIDINE HCL IN NACL 80 MCG/20ML IV SOLN
INTRAVENOUS | Status: DC | PRN
  Administered 2022-05-13: 12 ug via INTRAVENOUS

## 2022-05-13 MED ORDER — OXYCODONE HCL 5 MG/5ML PO SOLN: 5.0000 mg | Freq: Once | ORAL | Status: DC | PRN

## 2022-05-13 MED ORDER — ORAL CARE MOUTH RINSE: 15.0000 mL | Freq: Once | OROMUCOSAL | Status: AC

## 2022-05-13 MED ORDER — GUAIFENESIN-DM 100-10 MG/5ML PO SYRP: 15.0000 mL | ORAL_SOLUTION | ORAL | Status: DC | PRN

## 2022-05-13 MED ORDER — OXYCODONE HCL 5 MG PO TABS: 5.0000 mg | ORAL_TABLET | Freq: Once | ORAL | Status: DC | PRN

## 2022-05-13 MED ORDER — POTASSIUM CHLORIDE CRYS ER 20 MEQ PO TBCR: 20.0000 meq | EXTENDED_RELEASE_TABLET | Freq: Every day | ORAL | Status: DC | PRN

## 2022-05-13 MED ORDER — PHENOL 1.4 % MT LIQD: 1.0000 | OROMUCOSAL | Status: DC | PRN

## 2022-05-13 MED ORDER — DEXMEDETOMIDINE HCL IN NACL 80 MCG/20ML IV SOLN
INTRAVENOUS | Status: AC
  Filled 2022-05-13: qty 20

## 2022-05-13 MED ORDER — ACETAMINOPHEN 650 MG RE SUPP: 325.0000 mg | RECTAL | Status: DC | PRN

## 2022-05-13 MED ORDER — LORAZEPAM 2 MG/ML IJ SOLN
0.5000 mg | Freq: Once | INTRAMUSCULAR | Status: AC
  Administered 2022-05-13: 0.5 mg via INTRAVENOUS
  Filled 2022-05-13: qty 1

## 2022-05-13 MED ORDER — FENTANYL CITRATE (PF) 250 MCG/5ML IJ SOLN
INTRAMUSCULAR | Status: DC | PRN
  Administered 2022-05-13 (×3): 50 ug via INTRAVENOUS

## 2022-05-13 MED ORDER — INSULIN ASPART 100 UNIT/ML IJ SOLN
0.0000 [IU] | Freq: Three times a day (TID) | INTRAMUSCULAR | Status: DC
  Administered 2022-05-14: 8 [IU] via SUBCUTANEOUS

## 2022-05-13 MED ORDER — CHLORHEXIDINE GLUCONATE 0.12 % MT SOLN
OROMUCOSAL | Status: AC
  Administered 2022-05-13: 15 mL via OROMUCOSAL
  Filled 2022-05-13: qty 15

## 2022-05-13 MED ORDER — LACTATED RINGERS IV SOLN: INTRAVENOUS | Status: DC

## 2022-05-13 SURGICAL SUPPLY — 59 items
ADH SKN CLS APL DERMABOND .7 (GAUZE/BANDAGES/DRESSINGS) ×1
AGENT HMST KT MTR STRL THRMB (HEMOSTASIS) ×1
BAG COUNTER SPONGE SURGICOUNT (BAG) ×2 IMPLANT
BAG SPNG CNTER NS LX DISP (BAG) ×1
BANDAGE ESMARK 6X9 LF (GAUZE/BANDAGES/DRESSINGS) IMPLANT
BNDG CMPR 9X6 STRL LF SNTH (GAUZE/BANDAGES/DRESSINGS)
BNDG ELASTIC 4X5.8 VLCR STR LF (GAUZE/BANDAGES/DRESSINGS) IMPLANT
BNDG ESMARK 6X9 LF (GAUZE/BANDAGES/DRESSINGS)
CANISTER SUCT 3000ML PPV (MISCELLANEOUS) ×2 IMPLANT
CLIP TI MEDIUM 24 (CLIP) ×2 IMPLANT
CLIP TI WIDE RED SMALL 24 (CLIP) ×2 IMPLANT
CUFF TOURN SGL QUICK 24 (TOURNIQUET CUFF)
CUFF TOURN SGL QUICK 34 (TOURNIQUET CUFF)
CUFF TOURN SGL QUICK 42 (TOURNIQUET CUFF) IMPLANT
CUFF TRNQT CYL 24X4X16.5-23 (TOURNIQUET CUFF) IMPLANT
CUFF TRNQT CYL 34X4.125X (TOURNIQUET CUFF) IMPLANT
DERMABOND ADVANCED .7 DNX12 (GAUZE/BANDAGES/DRESSINGS) ×2 IMPLANT
DRAIN CHANNEL 15F RND FF W/TCR (WOUND CARE) IMPLANT
DRAPE X-RAY CASS 24X20 (DRAPES) IMPLANT
DRESSING PEEL AND PLC PRVNA 13 (GAUZE/BANDAGES/DRESSINGS) IMPLANT
DRSG PEEL AND PLACE PREVENA 13 (GAUZE/BANDAGES/DRESSINGS) ×1
ELECT REM PT RETURN 9FT ADLT (ELECTROSURGICAL) ×1
ELECTRODE REM PT RTRN 9FT ADLT (ELECTROSURGICAL) ×2 IMPLANT
EVACUATOR SILICONE 100CC (DRAIN) IMPLANT
GLOVE SURG SS PI 7.5 STRL IVOR (GLOVE) ×6 IMPLANT
GOWN STRL REUS W/ TWL LRG LVL3 (GOWN DISPOSABLE) ×4 IMPLANT
GOWN STRL REUS W/ TWL XL LVL3 (GOWN DISPOSABLE) ×2 IMPLANT
GOWN STRL REUS W/TWL LRG LVL3 (GOWN DISPOSABLE) ×2
GOWN STRL REUS W/TWL XL LVL3 (GOWN DISPOSABLE) ×1
GRAFT SKIN WND MICRO 38 (Tissue) IMPLANT
HEMOSTAT SNOW SURGICEL 2X4 (HEMOSTASIS) IMPLANT
KIT BASIN OR (CUSTOM PROCEDURE TRAY) ×2 IMPLANT
KIT DRSG PREVENA PLUS 7DAY 125 (MISCELLANEOUS) IMPLANT
KIT TURNOVER KIT B (KITS) ×2 IMPLANT
MARKER GRAFT CORONARY BYPASS (MISCELLANEOUS) IMPLANT
NS IRRIG 1000ML POUR BTL (IV SOLUTION) ×4 IMPLANT
PACK PERIPHERAL VASCULAR (CUSTOM PROCEDURE TRAY) ×2 IMPLANT
PAD ARMBOARD 7.5X6 YLW CONV (MISCELLANEOUS) ×4 IMPLANT
SET COLLECT BLD 21X3/4 12 (NEEDLE) IMPLANT
SPONGE T-LAP 18X18 ~~LOC~~+RFID (SPONGE) IMPLANT
STAPLER VISISTAT 35W (STAPLE) IMPLANT
STOPCOCK 4 WAY LG BORE MALE ST (IV SETS) IMPLANT
SURGIFLO W/THROMBIN 8M KIT (HEMOSTASIS) IMPLANT
SUT ETHILON 3 0 PS 1 (SUTURE) IMPLANT
SUT PROLENE 5 0 C 1 24 (SUTURE) ×2 IMPLANT
SUT PROLENE 6 0 BV (SUTURE) ×2 IMPLANT
SUT PROLENE 7 0 BV 1 (SUTURE) IMPLANT
SUT SILK 3 0 (SUTURE)
SUT SILK 3-0 18XBRD TIE 12 (SUTURE) IMPLANT
SUT VIC AB 2-0 CT1 27 (SUTURE) ×3
SUT VIC AB 2-0 CT1 TAPERPNT 27 (SUTURE) ×4 IMPLANT
SUT VIC AB 3-0 SH 27 (SUTURE) ×1
SUT VIC AB 3-0 SH 27X BRD (SUTURE) ×4 IMPLANT
SUT VICRYL 4-0 PS2 18IN ABS (SUTURE) ×4 IMPLANT
TOWEL GREEN STERILE (TOWEL DISPOSABLE) ×2 IMPLANT
TRAY FOLEY MTR SLVR 16FR STAT (SET/KITS/TRAYS/PACK) ×2 IMPLANT
TUBING EXTENTION W/L.L. (IV SETS) IMPLANT
UNDERPAD 30X36 HEAVY ABSORB (UNDERPADS AND DIAPERS) ×2 IMPLANT
WATER STERILE IRR 1000ML POUR (IV SOLUTION) ×2 IMPLANT

## 2022-05-13 NOTE — Anesthesia Preprocedure Evaluation (Addendum)
Anesthesia Evaluation  Patient identified by MRN, date of birth, ID band Patient awake    Reviewed: Allergy & Precautions, H&P , NPO status , Patient's Chart, lab work & pertinent test results  Airway Mallampati: II  TM Distance: >3 FB Neck ROM: Full    Dental no notable dental hx.    Pulmonary Current Smoker   Pulmonary exam normal breath sounds clear to auscultation       Cardiovascular hypertension, + Peripheral Vascular Disease  Normal cardiovascular exam Rhythm:Regular Rate:Normal     Neuro/Psych   Anxiety Depression    negative neurological ROS     GI/Hepatic Neg liver ROS,GERD  ,,  Endo/Other  diabetes, Poorly Controlled, Type 2    Renal/GU negative Renal ROS  negative genitourinary   Musculoskeletal negative musculoskeletal ROS (+)    Abdominal   Peds negative pediatric ROS (+)  Hematology negative hematology ROS (+)   Anesthesia Other Findings   Reproductive/Obstetrics negative OB ROS                             Anesthesia Physical Anesthesia Plan  ASA: 3 and emergent  Anesthesia Plan: General   Post-op Pain Management:    Induction: Intravenous and Rapid sequence  PONV Risk Score and Plan: Ondansetron, Dexamethasone and Treatment may vary due to age or medical condition  Airway Management Planned: Oral ETT  Additional Equipment:   Intra-op Plan:   Post-operative Plan: Extubation in OR  Informed Consent: I have reviewed the patients History and Physical, chart, labs and discussed the procedure including the risks, benefits and alternatives for the proposed anesthesia with the patient or authorized representative who has indicated his/her understanding and acceptance.     Dental advisory given  Plan Discussed with: CRNA and Surgeon  Anesthesia Plan Comments: (Patient had breakfast this am)       Anesthesia Quick Evaluation

## 2022-05-13 NOTE — ED Provider Notes (Signed)
[place] EMERGENCY DEPARTMENT AT [facility] Provider Note   CSN: [id number] Arrival date & time: 05/13/22  1103     History  Chief Complaint  Patient presents with   Post Sugery Complications   Blood in Wound Vac    [name] [name] is a 42 y.o. female who is status post right femoral-popliteal artery embolectomy and 4 compartment fasciotomy on her right leg done by Dr. Sherral Hammers of vascular surgery on 05/07/2022.  Patient currently on Eliquis.  Patient had wound VAC placed after this operation was completed.  The patient reports that this morning she bent down to pick something up and felt a "pop" near her wound and she now has blood draining from her wound VAC.  Patient reports that initially upon waking this morning, her wound VAC was empty.  Patient reports it is now filled with blood.  Patient states that she has pain from the incision site on the right side of her groin down into her right knee and it is now currently spreading into her vagina as well she states.  She describes this pain as a burning sensation.  The patient is in extreme pain rated 10 out of 10 near her incision site.  Patient denies any recent fevers, nausea or vomiting.  HPI     Home Medications Prior to Admission medications   Medication Sig Start Date End Date Taking? Authorizing Provider  rivaroxaban (XARELTO) 20 MG TABS tablet Take 1 tablet (20 mg total) by mouth daily. 05/09/22  Yes Baglia, Corrina, PA-C  aspirin EC 81 MG tablet Take 1 tablet (81 mg total) by mouth daily at 6 (six) AM. Swallow whole. 05/10/22   Baglia, Corrina, PA-C  Blood Glucose Monitoring Suppl (ACCU-CHEK GUIDE) w/Device KIT 1 each by Other route 4 (four) times daily -  before meals and at bedtime. 08/06/21   Rema Fendt, NP  gabapentin (NEURONTIN) 300 MG capsule TAKE 1 CAPSULE BY MOUTH THREE TIMES DAILY 01/27/22   Rema Fendt, NP  glucose blood (ACCU-CHEK GUIDE) test strip Use as instructed 08/06/21   Rema Fendt, NP   insulin glargine-yfgn (SEMGLEE) 100 UNIT/ML Pen Inject 28 Units into the skin at bedtime. 05/09/22   Baglia, Corrina, PA-C  Insulin Pen Needle (PEN NEEDLES 3/16") 31G X 5 MM MISC 1 each by Does not apply route daily. Use to inject insulin 05/09/22   Baglia, Corrina, PA-C  Lancets (ACCU-CHEK MULTICLIX) lancets Use as instructed 08/06/21   Rema Fendt, NP  lisinopril (ZESTRIL) 40 MG tablet Take 1 tablet by mouth once daily 05/05/22   Rema Fendt, NP  metFORMIN (GLUCOPHAGE-XR) 500 MG 24 hr tablet TAKE 2 TABLETS BY MOUTH TWICE DAILY WITH A MEAL 05/05/22   Rema Fendt, NP  oxyCODONE-acetaminophen (PERCOCET/ROXICET) 5-325 MG tablet Take 1-2 tablets by mouth every 4 (four) hours as needed for moderate pain. 05/09/22   Baglia, Corrina, PA-C  rosuvastatin (CRESTOR) 20 MG tablet Take 1 tablet (20 mg total) by mouth daily. 05/09/22   Baglia, Corrina, PA-C      Allergies    Sulfa antibiotics    Review of Systems   Review of Systems  Constitutional:  Negative for fever.  Gastrointestinal:  Positive for abdominal pain.  Musculoskeletal:  Positive for arthralgias and myalgias.  All other systems reviewed and are negative.   Physical Exam Updated Vital Signs BP 106/77   Pulse 83   Temp 98 F (36.7 C) (Oral)   Resp 18   Ht  5\' 1"  (1.549 m)   Wt 84.8 kg   LMP 04/29/2022 (Approximate) Comment: pregnancy waiver form signed 05-07-2022  SpO2 92%   BMI 35.33 kg/m  Physical Exam Vitals and nursing note reviewed.  Constitutional:      General: She is not in acute distress.    Appearance: She is not ill-appearing, toxic-appearing or diaphoretic.  HENT:     Head: Normocephalic and atraumatic.     Nose: Nose normal.     Mouth/Throat:     Mouth: Mucous membranes are moist.     Pharynx: Oropharynx is clear.  Eyes:     Extraocular Movements: Extraocular movements intact.     Conjunctiva/sclera: Conjunctivae normal.     Pupils: Pupils are equal, round, and reactive to light.  Cardiovascular:      Rate and Rhythm: Normal rate and regular rhythm.     Pulses:          Dorsalis pedis pulses are 2+ on the right side.  Pulmonary:     Effort: Pulmonary effort is normal.     Breath sounds: Normal breath sounds. No wheezing.  Abdominal:     General: Abdomen is flat. Bowel sounds are normal.     Palpations: Abdomen is soft.     Comments: Wound VAC in place to the patient right lower quadrant/inguinal area.  Pad does appear bloodsoaked.  No abdominal tenderness.  Musculoskeletal:     Cervical back: Normal range of motion and neck supple.  Skin:    General: Skin is warm and dry.     Capillary Refill: Capillary refill takes less than 2 seconds.     Comments: Well-healing staples to lateral, medial portion of right lower extremity.  Wounds appear well-healing, clean dry and intact  Neurological:     Mental Status: She is alert and oriented to person, place, and time.          ED Results / Procedures / Treatments   Labs (all labs ordered are listed, but only abnormal results are displayed) Labs Reviewed  CBC - Abnormal; Notable for the following components:      Result Value   WBC 14.2 (*)    RBC 5.15 (*)    Platelets 443 (*)    All other components within normal limits  COMPREHENSIVE METABOLIC PANEL - Abnormal; Notable for the following components:   Sodium 131 (*)    Chloride 97 (*)    Glucose, Bld 292 (*)    All other components within normal limits  GLUCOSE, CAPILLARY - Abnormal; Notable for the following components:   Glucose-Capillary 227 (*)    All other components within normal limits  I-STAT BETA HCG BLOOD, ED ([facility], [facility], [facility] ONLY)  TYPE AND SCREEN    EKG None  Radiology CT ANGIO AO+BIFEM W & OR WO CONTRAST  Result Date: 05/13/2022 CLINICAL DATA:  Status post popliteal femoral embolectomy 5/25. Felt pop and has blood coming from wound VAC near incision site. EXAM: CT ANGIOGRAPHY OF ABDOMINAL AORTA WITH ILIOFEMORAL RUNOFF TECHNIQUE: Multidetector CT imaging of the  abdomen, pelvis and lower extremities was performed using the standard protocol during bolus administration of intravenous contrast. Multiplanar CT image reconstructions and MIPs were obtained to evaluate the vascular anatomy. RADIATION DOSE REDUCTION: This exam was performed according to the departmental dose-optimization program which includes automated exposure control, adjustment of the mA and/or kV according to patient size and/or use of iterative reconstruction technique. CONTRAST:  OMNIPAQUE IOHEXOL 350 MG/ML SOLN COMPARISON:  CT angiogram aortofemoral  with ileal femoral runoff 05/07/2022 FINDINGS: VASCULAR Aorta: Normal caliber aorta without aneurysm, dissection, vasculitis or significant stenosis. Celiac: Patent without evidence of aneurysm, dissection, vasculitis or significant stenosis. SMA: Patent without evidence of aneurysm, dissection, vasculitis or significant stenosis. Renals: Both renal arteries are patent without evidence of aneurysm, dissection, vasculitis, fibromuscular dysplasia or significant stenosis. Accessory left renal artery present. IMA: Patent without evidence of aneurysm, dissection, vasculitis or significant stenosis. RIGHT Lower Extremity Inflow: Common, internal and external iliac arteries are patent without evidence of aneurysm, dissection, vasculitis or significant stenosis. Outflow: Common, superficial and profunda femoral arteries and the popliteal artery are patent without evidence of aneurysm, dissection, vasculitis or significant stenosis. Runoff: Patent three vessel runoff to the ankle. LEFT Lower Extremity Inflow: Common, internal and external iliac arteries are patent without evidence of aneurysm, dissection, vasculitis or significant stenosis. Outflow: Common, superficial and profunda femoral arteries and the popliteal artery are patent without evidence of aneurysm, dissection, vasculitis or significant stenosis. Runoff: Patent three vessel runoff to the ankle.  Veins: Infrarenal IVC filter is present. No obvious venous abnormality within the limitations of this arterial phase study. Review of the MIP images confirms the above findings. NON-VASCULAR Lower chest: There are patchy ground-glass opacities in both lung bases similar to prior. Mediastinal and hilar lymphadenopathy is unchanged. Hepatobiliary: No focal liver abnormality is seen. No gallstones, gallbladder wall thickening, or biliary dilatation. Pancreas: Unremarkable. No pancreatic ductal dilatation or surrounding inflammatory changes. Spleen: Normal in size without focal abnormality. Adrenals/Urinary Tract: Adrenal glands are unremarkable. Kidneys are normal, without renal calculi, focal lesion, or hydronephrosis. Bladder is unremarkable. Stomach/Bowel: Stomach is within normal limits. Appendix appears normal. No evidence of bowel wall thickening, distention, or inflammatory changes. There is sigmoid and descending colon diverticulosis. Lymphatic: No enlarged abdominal or pelvic lymph nodes. Reproductive: Uterus and bilateral adnexa are unremarkable. Other: There is no ascites or focal abdominal wall hernia. Within the right inguinal region anterior to the right common and superficial femoral artery there is a heterogeneous subcutaneous hyperdense area measuring 2.8 x 6.4 by 7.3 cm with surrounding edema compatible with hematoma. Surgical clips are seen in this region. There is active bleeding identified along the superolateral margin where there is a small amount of soft tissue gas. There is a small amount of active bleeding within the anterior hematoma as well image 5/236. Bleeding branch may represent the superficial circumflex iliac artery. Musculoskeletal: No acute osseous abnormality. Skin staples are seen in the right lower extremity. There is nonspecific mild subcutaneous edema in the right lower extremity below-the-knee without definite fluid collection. IMPRESSION: 1. Hematoma in the right inguinal  region with active bleeding. Bleeding branch may represent the superficial circumflex iliac artery. 2. Right superficial femoral artery is now patent. 3. No acute vascular pathology in the abdomen, pelvis or lower extremities. NON-VASCULAR 1. No acute localizing process in the abdomen or pelvis. 2. Stable ground-glass opacities in the lung bases. 3. Stable mediastinal and hilar lymphadenopathy. Electronically Signed   By: Darliss Cheney M.D.   On: 05/13/2022 15:20    Procedures Procedures    Medications Ordered in ED Medications  lactated ringers infusion ( Intravenous Restarted 05/13/22 1525)  insulin aspart (novoLOG) injection 0-14 Units (6 Units Subcutaneous Given 05/13/22 1459)  fentaNYL (SUBLIMAZE) injection 50 mcg (50 mcg Intravenous Given 05/13/22 1146)  LORazepam (ATIVAN) injection 0.5 mg (0.5 mg Intravenous Given 05/13/22 1147)  HYDROmorphone (DILAUDID) injection 1 mg (1 mg Intravenous Given 05/13/22 1230)  chlorhexidine (PERIDEX) 0.12 % solution 15 mL (  15 mLs Mouth/Throat Given 05/13/22 1508)    Or  Oral care mouth rinse ( Mouth Rinse See Alternative 05/13/22 1508)  iohexol (OMNIPAQUE) 350 MG/ML injection 100 mL (100 mLs Intravenous Contrast Given 05/13/22 1439)  ceFAZolin (ANCEF) 2-4 GM/100ML-% IVPB (  Duplicate 05/13/22 1536)    ED Course/ Medical Decision Making/ A&P  Medical Decision Making Amount and/or Complexity of Data Reviewed Labs: ordered. Radiology: ordered.  Risk Prescription drug management.   42 year old female presents to ED for evaluation.  Please see HPI for further details.  On examination the patient is in distress.  The patient is afebrile and nontachycardic.  Her lung sounds are clear bilaterally, she is not hypoxic.  Her abdomen has surgical site with wound VAC in place that appears bloodsoaked.  There is no abdominal tenderness.  Neurological examination at baseline.  CBC with white blood cell count elevation 14.2, no anemia.  CMP with sodium 131, glucose 92, stable  creatinine.  Istat beta HCG negative.   Patient given 50 mcg fentanyl, 0.5 mg Ativan.  Patient reports pain all controlled.  1 mg Dilaudid administered.  CT scan of abdomen pelvis shows hematoma near incision site.  Reached out to Dr. Myra Gianotti of vascular surgery.  Dr. Myra Gianotti will take the patient to the OR.   Final Clinical Impression(s) / ED Diagnoses Final diagnoses:  Postoperative hematoma involving circulatory system following other circulatory system procedure    Rx / DC Orders ED Discharge Orders     None         Clent Ridges 05/13/22 1548    Rondel Baton, MD 05/13/22 1759

## 2022-05-13 NOTE — Anesthesia Procedure Notes (Signed)
Procedure Name: Intubation Date/Time: 05/13/2022 3:35 PM  Performed by: Tressia Miners, CRNAPre-anesthesia Checklist: Patient identified, Emergency Drugs available, Suction available, Patient being monitored and Timeout performed Patient Re-evaluated:Patient Re-evaluated prior to induction Oxygen Delivery Method: Circle system utilized Preoxygenation: Pre-oxygenation with 100% oxygen Induction Type: IV induction Ventilation: Mask ventilation without difficulty Laryngoscope Size: Mac and 3 Grade View: Grade I Tube type: Oral Tube size: 7.0 mm Number of attempts: 1 Airway Equipment and Method: Stylet Placement Confirmation: ETT inserted through vocal cords under direct vision, positive ETCO2 and breath sounds checked- equal and bilateral Secured at: 21 cm Tube secured with: Tape Dental Injury: Teeth and Oropharynx as per pre-operative assessment  Comments: Smooth IV Induction. Eyes taped. Easy mask. DL x 1 with grade 1 view. Atraumatically placed, teeth and lip remain intact as pre-op. Secured with tape. Bilateral breath sounds +/=, EtCO2 +, Adequate TV, VSS.

## 2022-05-13 NOTE — Op Note (Signed)
Patient name: [name] [name] MRN: [id number] DOB: [birth year] Sex: female  05/13/2022 Pre-operative Diagnosis: Right groin hematoma Post-operative diagnosis:  Same Surgeon:  Durene Cal Assistants:  Nathanial Rancher, PA Procedure:   #1: Right groin exploration with evacuation of hematoma   #2: Placement of skin substitute, first 28 cm (Kerecis, CPT 15271)   #3: Placement of Prevena wound VAC  Anesthesia:  General Blood Loss: Minimal Specimens: None  Findings: Mostly old hematoma was evacuated.  I extensively explored the wound.  There were several small areas of oozing from tissue beds which were able to be controlled with cautery.  I placed Surgiflo in the wound after getting good hemostasis.  Kerecis was also placed followed by skin staples.  The artery was well incorporated  Indications: This is a 42 year old female who recently underwent right lower extremity thromboembolectomy with fasciotomies.  While at home this morning she felt a pop and then developed a swelling in her right groin.  Her Prevena wound VAC filled out with fluid and she came to the emergency department.  She had a CT scan that showed that the artery was intact without pseudoaneurysm however there did appear to be active extravasation I discussed going to the operating room for exploration.  Procedure:  The patient was identified in the holding area and taken to [facility] OR ROOM 12  The patient was then placed supine on the table. general anesthesia was administered.  The patient was prepped and draped in the usual sterile fashion.  A time out was called and antibiotics were administered.  A PA was necessary to expedite the procedure and assist with technical details.  She help with exposure by providing suction and retraction.  The patient's previous oblique incision staples were removed.  I then used Metzenbaum scissors to the cut through the previous suture down to the artery.  There was large amounts of hematoma present  which were evacuated.  There did appear to be some bright red oozing up at the level of the inguinal ligament.  No obvious pulsatile bleeding.  This area was controlled with cautery.  There was also fresh oozing from the base of the incision.  Again no active arterial bleeding was identified.  All of this was controlled with cautery.  The wound was then copiously irrigated.  The tissue over the artery was well covered.  There was no bleeding coming from the arterial site.  I then watched the wound for 10 to 15 minutes to make sure there is no obvious bleeding.  I then placed Surgiflo at the apex and the base of the wound.  All raw surface areas were cauterized with the Bovie.  She did have some generalized oozing from her anticoagulation.  Next, I reapproximated the deep tissue with interrupted 2-0 Vicryl.  Another layer of interrupted Vicryl was used to close the subcutaneous tissue.  I then placed Kerecis powder on the wound below the skin and then closed the skin with staples.  A Prevena wound VAC was placed.  She was successfully extubated from anesthesia and taken recovery in stable condition.   Disposition: To PACU stable.   Juleen China, M.D., [facility] [place] Office: [phone] Pager:  [phone]

## 2022-05-13 NOTE — ED Triage Notes (Signed)
Pt came in via POV d/t stating she felt like something "pulled" in her Lt leg/groin area where she is post surgery from a clot removal 4 days ago. She states she laid down after feeling that initial pain & then wen she stood up there was bright red blood in her wound vac. Very tearful in triage rating pain in that area 10/10. A/Ox4.

## 2022-05-13 NOTE — Plan of Care (Signed)
  Problem: Education: Goal: Knowledge of prescribed regimen will improve Outcome: Progressing   Problem: Activity: Goal: Ability to tolerate increased activity will improve Outcome: Progressing   Problem: Bowel/Gastric: Goal: Gastrointestinal status for postoperative course will improve Outcome: Progressing   Problem: Clinical Measurements: Goal: Postoperative complications will be avoided or minimized Outcome: Progressing Goal: Signs and symptoms of graft occlusion will improve Outcome: Progressing   Problem: Skin Integrity: Goal: Demonstration of wound healing without infection will improve Outcome: Progressing   Problem: Education: Goal: Ability to describe self-care measures that may prevent or decrease complications (Diabetes Survival Skills Education) will improve Outcome: Progressing Goal: Individualized Educational Video(s) Outcome: Progressing   Problem: Coping: Goal: Ability to adjust to condition or change in health will improve Outcome: Progressing   Problem: Fluid Volume: Goal: Ability to maintain a balanced intake and output will improve Outcome: Progressing   Problem: Health Behavior/Discharge Planning: Goal: Ability to identify and utilize available resources and services will improve Outcome: Progressing Goal: Ability to manage health-related needs will improve Outcome: Progressing   Problem: Metabolic: Goal: Ability to maintain appropriate glucose levels will improve Outcome: Progressing   Problem: Nutritional: Goal: Maintenance of adequate nutrition will improve Outcome: Progressing Goal: Progress toward achieving an optimal weight will improve Outcome: Progressing   Problem: Skin Integrity: Goal: Risk for impaired skin integrity will decrease Outcome: Progressing   Problem: Tissue Perfusion: Goal: Adequacy of tissue perfusion will improve Outcome: Progressing   Problem: Education: Goal: Knowledge of General Education information will  improve Description: Including pain rating scale, medication(s)/side effects and non-pharmacologic comfort measures Outcome: Progressing   Problem: Health Behavior/Discharge Planning: Goal: Ability to manage health-related needs will improve Outcome: Progressing   Problem: Clinical Measurements: Goal: Ability to maintain clinical measurements within normal limits will improve Outcome: Progressing Goal: Will remain free from infection Outcome: Progressing Goal: Diagnostic test results will improve Outcome: Progressing Goal: Respiratory complications will improve Outcome: Progressing Goal: Cardiovascular complication will be avoided Outcome: Progressing   Problem: Activity: Goal: Risk for activity intolerance will decrease Outcome: Progressing   Problem: Nutrition: Goal: Adequate nutrition will be maintained Outcome: Progressing   Problem: Coping: Goal: Level of anxiety will decrease Outcome: Progressing   Problem: Elimination: Goal: Will not experience complications related to bowel motility Outcome: Progressing Goal: Will not experience complications related to urinary retention Outcome: Progressing   Problem: Pain Managment: Goal: General experience of comfort will improve Outcome: Progressing   Problem: Safety: Goal: Ability to remain free from injury will improve Outcome: Progressing   Problem: Skin Integrity: Goal: Risk for impaired skin integrity will decrease Outcome: Progressing   

## 2022-05-13 NOTE — Consult Note (Addendum)
Hospital Consult    Reason for Consult:  right groin hematoma Requesting Physician:  ED MRN #:  [id number]  History of Present Illness: [name] status post right leg embolectomy and 4 compartment fasciotomy by Dr. Sherral Hammers on 05/07/2022 due to acute right leg ischemia.  She was discharged with a Prevena to the right groin incision.  This morning when bending over she felt a pop in her right groin.  Shortly thereafter she developed a hematoma in the right groin with severe pain and was brought to the emergency department.  She is somewhat loopy after receiving pain medications however continues to be screaming out in pain.  She was discharged on aspirin and Xarelto.  She had breakfast this morning however has not had anything to eat or drink since then.  Past Medical History:  Diagnosis Date   Anxiety    Back fracture    Depression    Diabetes mellitus without complication (HCC)    Family history of adverse reaction to anesthesia    sister gets nausea and vomitting and itching on her face. Around her nose and mouth.   Fatty liver disease, nonalcoholic    GERD (gastroesophageal reflux disease)    H/O cesarean section complicating pregnancy 04/18/2014   Hypertension    Neuromuscular disorder (HCC)    eye twitch on left eye    Obesity    Pelvis fracture (HCC)    S/P cesarean section 06/01/2014   S/P tubal ligation 06/01/2014    Past Surgical History:  Procedure Laterality Date   CESAREAN SECTION     CESAREAN SECTION WITH BILATERAL TUBAL LIGATION Bilateral 06/01/2014   Procedure: CESAREAN SECTION WITH BILATERAL TUBAL LIGATION;  Surgeon: Sherian Rein, MD;  Location: [facility];  Service: Obstetrics;  Laterality: Bilateral;  MD requests RNFA   CHEST TUBE INSERTION     FEMORAL-POPLITEAL BYPASS GRAFT Right 05/07/2022   Procedure: RIGHT FEMORAL-POPLITEAL ARTERY EMBOLECTOMY AND 4  COMPARMENT FASCIOTOMY;  Surgeon: [name], MD;  Location: [facility] OR;  Service: Vascular;   Laterality: Right;   TONSILLECTOMY     TRACHEOSTOMY      Allergies  Allergen Reactions   Sulfa Antibiotics     Prior to Admission medications   Medication Sig Start Date End Date Taking? Authorizing Provider  aspirin EC 81 MG tablet Take 1 tablet (81 mg total) by mouth daily at 6 (six) AM. Swallow whole. 05/10/22   Baglia, Corrina, PA-C  Blood Glucose Monitoring Suppl (ACCU-CHEK GUIDE) w/Device KIT 1 each by Other route 4 (four) times daily -  before meals and at bedtime. 08/06/21   Rema Fendt, NP  gabapentin (NEURONTIN) 300 MG capsule TAKE 1 CAPSULE BY MOUTH THREE TIMES DAILY 01/27/22   Rema Fendt, NP  glucose blood (ACCU-CHEK GUIDE) test strip Use as instructed 08/06/21   Rema Fendt, NP  insulin glargine-yfgn (SEMGLEE) 100 UNIT/ML Pen Inject 28 Units into the skin at bedtime. 05/09/22   Baglia, Corrina, PA-C  Insulin Pen Needle (PEN NEEDLES 3/16") 31G X 5 MM MISC 1 each by Does not apply route daily. Use to inject insulin 05/09/22   Baglia, Corrina, PA-C  Lancets (ACCU-CHEK MULTICLIX) lancets Use as instructed 08/06/21   Rema Fendt, NP  lisinopril (ZESTRIL) 40 MG tablet Take 1 tablet by mouth once daily 05/05/22   Rema Fendt, NP  metFORMIN (GLUCOPHAGE-XR) 500 MG 24 hr tablet TAKE 2 TABLETS BY MOUTH TWICE DAILY WITH A MEAL 05/05/22   Zonia Kief, Amy  J, NP  oxyCODONE-acetaminophen (PERCOCET/ROXICET) 5-325 MG tablet Take 1-2 tablets by mouth every 4 (four) hours as needed for moderate pain. 05/09/22   Baglia, Corrina, PA-C  rivaroxaban (XARELTO) 20 MG TABS tablet Take 1 tablet (20 mg total) by mouth daily. 05/09/22   Baglia, Corrina, PA-C  rosuvastatin (CRESTOR) 20 MG tablet Take 1 tablet (20 mg total) by mouth daily. 05/09/22   Graceann Congress, PA-C    Social History   Socioeconomic History   Marital status: Legally Separated    Spouse name: Not on file   Number of children: Not on file   Years of education: Not on file   Highest education level: Not on file  Occupational  History   Not on file  Tobacco Use   Smoking status: Every Day    Packs/day: 2.00    Years: 12.00    Additional pack years: 0.00    Total pack years: 24.00    Types: Cigarettes    Passive exposure: Current   Smokeless tobacco: Never  Vaping Use   Vaping Use: Never used  Substance and Sexual Activity   Alcohol use: No   Drug use: No   Sexual activity: Yes    Birth control/protection: None  Other Topics Concern   Not on file  Social History Narrative   Not on file   Social Determinants of Health   Financial Resource Strain: Not on file  Food Insecurity: No Food Insecurity (05/07/2022)   Hunger Vital Sign    Worried About Running Out of Food in the Last Year: Never true    Ran Out of Food in the Last Year: Never true  Transportation Needs: No Transportation Needs (05/07/2022)   PRAPARE - Administrator, Civil Service (Medical): No    Lack of Transportation (Non-Medical): No  Physical Activity: Not on file  Stress: Not on file  Social Connections: Not on file  Intimate Partner Violence: Not At Risk (05/07/2022)   Humiliation, Afraid, Rape, and Kick questionnaire    Fear of Current or Ex-Partner: No    Emotionally Abused: No    Physically Abused: No    Sexually Abused: No     Family History  Problem Relation Age of Onset   Diabetes Maternal Grandmother    Diabetes Paternal Grandmother     ROS: Otherwise negative unless mentioned in HPI  Physical Examination  Vitals:   05/13/22 1115  BP: (!) 143/92  Pulse: 88  Resp: 16  Temp: 98.4 F (36.9 C)  SpO2: 100%   There is no height or weight on file to calculate BMI.  General:  WDWN in NAD Gait: Not observed HENT: WNL, normocephalic Pulmonary: normal non-labored breathing, without Rales, rhonchi,  wheezing Cardiac: regular Abdomen:  soft, NT/ND, no masses Skin: without rashes Vascular Exam/Pulses: palpable R DP and PT Extremities: Right groin incisional VAC with a blood-filled canister; palpable  hematoma in the groin without skin compromise Musculoskeletal: no muscle wasting or atrophy  Neurologic: Somewhat sedated Psychiatric:  The pt has Normal affect. Lymph:  Unremarkable  CBC    Component Value Date/Time   WBC 14.2 (H) 05/13/2022 1140   RBC 5.15 (H) 05/13/2022 1140   HGB 14.9 05/13/2022 1140   HCT 43.4 05/13/2022 1140   PLT 443 (H) 05/13/2022 1140   MCV 84.3 05/13/2022 1140   MCH 28.9 05/13/2022 1140   MCHC 34.3 05/13/2022 1140   RDW 12.7 05/13/2022 1140   LYMPHSABS 2.9 05/07/2022 0521   MONOABS 0.7 05/07/2022 0521  EOSABS 0.1 05/07/2022 0521   BASOSABS 0.0 05/07/2022 0521    BMET    Component Value Date/Time   NA 131 (L) 05/13/2022 1140   NA 137 08/06/2021 1613   K 4.8 05/13/2022 1140   CL 97 (L) 05/13/2022 1140   CO2 23 05/13/2022 1140   GLUCOSE 292 (H) 05/13/2022 1140   BUN 15 05/13/2022 1140   BUN 11 08/06/2021 1613   CREATININE 0.65 05/13/2022 1140   CALCIUM 9.1 05/13/2022 1140   GFRNONAA >60 05/13/2022 1140   GFRAA >60 07/11/2018 2016    COAGS: Lab Results  Component Value Date   INR 1.0 10/09/2021   INR 0.97 09/30/2017     Non-Invasive Vascular Imaging:   CTA pending    ASSESSMENT/PLAN: [name] status post right leg embolectomy about 1 week ago who presented to the emergency department with hematoma in the right groin  -Ms. Mcroberts is a 42 year old female 1 week status post right leg embolectomy.  She felt a pop in her right groin this morning and has developed an enlarging hematoma.  Prevena was left in place on exam to hopefully control bleeding.  She still has a palpable DP and PT pulse.  Given her severe pain and enlarging hematoma in the right groin she will require emergent trip to the operating room for exploration and hematoma evacuation.  Prior to the OR we will obtain CTA abdomen and pelvis.  On-call vascular surgeon Dr. Myra Gianotti will evaluate the patient and provide further treatment plans.  Keep patient NPO.   Type and screen sent.   Emilie Rutter PA-C [facility] [phone]   I agree with the above.  I have seen and evaluated the patient.  She is 1 week out from a right lower extremity thromboembolectomy.  She felt a pop in her right groin this morning and developed swelling in her groin.  Her Prevena filled with fluid.  She had a CT scan that did not show any large pseudoaneurysm.  Because of her discomfort, I have recommended exploration.  We discussed the possibility of leaving her wound open with a wound VAC.  All questions were answered.  Durene Cal

## 2022-05-13 NOTE — Discharge Summary (Signed)
Bypass Discharge Summary Patient ID: [name] [name] [id number] 42 y.o. 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 female who presented to the ED with a 1 week history of right lower extremity pain.  The pain progressed to include paresthesias in the foot.  Motor intact.  Imaging demonstrated superficial femoral artery thrombus.  After discussing the risk and benefits of right lower extremity femoral-popliteal embolectomy, 4 compartment fasciotomy, [place] elected to proceed.   Hospital Course: [name] [name] presented ED on 05/07/22 with right lower extremity critical limb ischemia. She was taken to the Operating room and underwent Right femoral-popliteal embolectomy, 4 compartment fasciotomy with Prevena Vacuum assisted dressing to right groin by Dr. Karin Lieu. She tolerated the procedure well and was taken to the recovery room in stable condition.   She was kept on IV heparin overnight. POD#1, she did well overnight with resolution of her rest pain. However she was complaining of right leg fasciotomy site and groin pain as expected. The right groin Prevena VAC remained in place with a good seal. Her right leg fasciotomy dressings remained intact and dry. Palpable right Dp and PT pulses. Motor and sensation intact. Encouraged mobilizing to chair with progression to walking. Echo and CTA chest ordered  POD#2, continued perfusion of right lower extremity with palpable DP and PT pulses. Prevena vac in right groin intact. Right leg fasciotomy sites well appearing with staples intact,  clean and dry. Motor and sensation intact. Able to ambulate with assistance. Overall pain well controlled. PT/ OT assessed patient with OT HH recommended. Patient however felt this was not necessary. She otherwise remained hemodynamically stable. Echo  and CTA did not show any embolic sources. Transitioned her off of IV heparin to Xarelto. She remained stable for discharge home. Encouraged her to mobilize as able. When sitting or in bed encouraged her to elevate her right leg. She will go home on Aspirin and Xarelto. PDMP reviewed and post operative pain medication sent to her pharmacy. She was given instructions on her Prevena VAC prior to discharge. She will follow up in our office in 2-3 weeks for staple removal.   Consults: None  Treatments: antibiotics: Ancef, analgesia: Dilaudid and Aspirin, Oxycodone-Acetaminophen, anticoagulation: ASA and heparin, therapies: PT, OT, RN, and SW, and surgery: Right femoral-popliteal embolectomy, 4 compartment fasciotomy Prevena Vacuum assisted dressing   Disposition: Discharge disposition: 01-Home or Self Care       - For [facility] Registry use ---  Post-op:  Wound infection: No  Graft infection: No  Transfusion: No  If yes, 0 units given New Arrhythmia: No Patency judged by: [ ]  Dopper only, [ ]  Palpable graft pulse, [ X] Palpable distal pulse, [ ]  ABI inc. > 0.15, [ ]  Duplex D/C Ambulatory Status: Ambulatory with Assistance  Complications: MI: [ X] No, [ ]  Troponin only, [ ]  EKG or Clinical CHF: No Resp failure: [ ]  none, [ ]  Pneumonia, [ ]  Ventilator Chg in renal function: [ ]  none, [ ]  Inc. Cr > 0.5, [ ]  Temp. Dialysis, [ ]  Permanent dialysis Stroke: [ ]  None, [ ]  Minor, [ ]  Major Return to OR:  No  Reason for return to OR: [ ]  Bleeding, [ ]  Infection, [ ]  Thrombosis, [ ]  Revision  Discharge medications: Statin use:  Yes ASA use:  Yes Plavix use:  No  for medical reason not indicated Beta blocker use: No  for medical reason not  indicated Coumadin use: No  for medical reason not indicated    Patient Instructions:  Allergies as of 05/09/2022       Reactions   Sulfa Antibiotics         Medication List     TAKE these medications    Accu-Chek Guide test strip Generic drug: glucose blood Use as instructed   Accu-Chek Guide w/Device Kit 1 each by Other route 4 (four) times daily -  before meals and at bedtime.   accu-chek multiclix lancets Use as instructed   aspirin EC 81 MG tablet Take 1 tablet (81 mg total) by mouth daily at 6 (six) AM. Swallow whole.   gabapentin 300 MG capsule Commonly known as: NEURONTIN TAKE 1 CAPSULE BY MOUTH THREE TIMES DAILY   insulin glargine-yfgn 100 UNIT/ML Pen Commonly known as: SEMGLEE Inject 28 Units into the skin at bedtime.   lisinopril 40 MG tablet Commonly known as: ZESTRIL Take 1 tablet by mouth once daily   metFORMIN 500 MG 24 hr tablet Commonly known as: GLUCOPHAGE-XR TAKE 2 TABLETS BY MOUTH TWICE DAILY WITH A MEAL   oxyCODONE-acetaminophen 5-325 MG tablet Commonly known as: PERCOCET/ROXICET Take 1-2 tablets by mouth every 4 (four) hours as needed for moderate pain.   Pen Needles 3/16" 31G X 5 MM Misc 1 each by Does not apply route daily. Use to inject insulin What changed:  medication strength how much to take how to take this when to take this additional instructions   rivaroxaban 20 MG Tabs tablet Commonly known as: XARELTO Take 1 tablet (20 mg total) by mouth daily.   rosuvastatin 20 MG tablet Commonly known as: CRESTOR Take 1 tablet (20 mg total) by mouth daily.               Discharge Care Instructions  (From admission, onward)           Start     Ordered   05/09/22 0000  Discharge wound care:       Comments: Keep Pravena wound vac on your groin incision until it loses it seal in about 7-10 days.  Once that happens, you can remove and then wash the groin wound with soap and water daily and pat dry. (No tub bath-only  shower)  Then put a dry gauze or washcloth in the groin to keep this area dry to help prevent wound infection.  Do this daily and as needed.  Do not use Vaseline or neosporin on your incisions.  Only use soap and water on your incisions and then protect and keep dry.  Right lower leg incisions clean with mild soap and water, pat dry. DO not apply any topical ointments, lotions, etc   05/09/22 1131           Activity: activity as tolerated, no driving while on analgesics, and no heavy lifting for 4-6 weeks Diet: low fat, low cholesterol diet Wound Care:  Wash right leg with mild soap and water, pat dry. Prevena VAC per instructions  Follow-up with [facility] in 2-3 weeks.  Signed: Graceann Congress 05/13/2022 3:18 PM

## 2022-05-13 NOTE — Transfer of Care (Signed)
Immediate Anesthesia Transfer of Care Note  Patient: [name] [name]  Procedure(s) Performed: RIGHTGROIN EXPLORATION , HEMATOMA EVACUATION (Right: Groin) APPLICATION OF PREVENA WOUND VAC (Right: Groin)  Patient Location: PACU  Anesthesia Type:General  Level of Consciousness: awake and alert   Airway & Oxygen Therapy: Patient Spontanous Breathing and Patient connected to face mask oxygen  Post-op Assessment: Report given to RN and Post -op Vital signs reviewed and stable  Post vital signs: Reviewed and stable  Last Vitals:  Vitals Value Taken Time  BP 114/63 05/13/22 1640  Temp    Pulse 92 05/13/22 1643  Resp 14 05/13/22 1643  SpO2 99 % 05/13/22 1643  Vitals shown include unvalidated device data.  Last Pain:  Vitals:   05/13/22 1433  TempSrc: Oral  PainSc:          Complications: No notable events documented.

## 2022-05-14 ENCOUNTER — Encounter (HOSPITAL_COMMUNITY): Payer: Self-pay | Admitting: Surgery

## 2022-05-14 ENCOUNTER — Other Ambulatory Visit (HOSPITAL_COMMUNITY): Payer: Self-pay

## 2022-05-14 ENCOUNTER — Telehealth (HOSPITAL_COMMUNITY): Payer: Self-pay | Admitting: Pharmacy Technician

## 2022-05-14 LAB — GLUCOSE, CAPILLARY
Glucose-Capillary: 257 mg/dL — ABNORMAL HIGH (ref 70–99)
Glucose-Capillary: 268 mg/dL — ABNORMAL HIGH (ref 70–99)

## 2022-05-14 LAB — CBC
HCT: 34.3 % — ABNORMAL LOW (ref 36.0–46.0)
Hemoglobin: 11.8 g/dL — ABNORMAL LOW (ref 12.0–15.0)
MCH: 28.9 pg (ref 26.0–34.0)
MCHC: 34.4 g/dL (ref 30.0–36.0)
MCV: 83.9 fL (ref 80.0–100.0)
Platelets: 319 10*3/uL (ref 150–400)
RBC: 4.09 MIL/uL (ref 3.87–5.11)
RDW: 12.7 % (ref 11.5–15.5)
WBC: 15.3 10*3/uL — ABNORMAL HIGH (ref 4.0–10.5)
nRBC: 0 % (ref 0.0–0.2)

## 2022-05-14 MED ORDER — INSULIN GLARGINE-YFGN 100 UNIT/ML ~~LOC~~ SOLN
28.0000 [IU] | Freq: Every day | SUBCUTANEOUS | Status: DC
  Administered 2022-05-14: 28 [IU] via SUBCUTANEOUS
  Filled 2022-05-14: qty 0.28

## 2022-05-14 MED ORDER — INSULIN GLARGINE-YFGN 100 UNIT/ML ~~LOC~~ SOLN
28.0000 [IU] | Freq: Every day | SUBCUTANEOUS | Status: DC
  Filled 2022-05-14: qty 0.28

## 2022-05-14 MED ORDER — RIVAROXABAN 20 MG PO TABS
20.0000 mg | ORAL_TABLET | Freq: Every day | ORAL | Status: DC
  Administered 2022-05-14: 20 mg via ORAL
  Filled 2022-05-14: qty 1

## 2022-05-14 NOTE — Progress Notes (Addendum)
  Progress Note    05/14/2022 7:47 AM 1 Day Post-Op  Subjective:  feeling fine. Right groin is sore. Has gotten up and gone to the bathroom a few times, tolerated it well. Feels okay to go home   Vitals:   05/14/22 0000 05/14/22 0345  BP: 114/63 (!) 98/59  Pulse: 76 76  Resp: 18 18  Temp: 98.1 F (36.7 C) 98 F (36.7 C)  SpO2: 95% 98%    Physical Exam: General:  no acute distress Cardiac:  regular Lungs:  nonlabored Incisions:  right groin incision intact with prevena, no evidence of further bleeding. RLE fasciotomies intact with staples Extremities:  RLE warm and well perfused, palpable PT/DP   CBC    Component Value Date/Time   WBC 15.3 (H) 05/14/2022 0137   RBC 4.09 05/14/2022 0137   HGB 11.8 (L) 05/14/2022 0137   HCT 34.3 (L) 05/14/2022 0137   PLT 319 05/14/2022 0137   MCV 83.9 05/14/2022 0137   MCH 28.9 05/14/2022 0137   MCHC 34.4 05/14/2022 0137   RDW 12.7 05/14/2022 0137   LYMPHSABS 2.9 05/07/2022 0521   MONOABS 0.7 05/07/2022 0521   EOSABS 0.1 05/07/2022 0521   BASOSABS 0.0 05/07/2022 0521    BMET    Component Value Date/Time   NA 131 (L) 05/13/2022 1140   NA 137 08/06/2021 1613   K 4.8 05/13/2022 1140   CL 97 (L) 05/13/2022 1140   CO2 23 05/13/2022 1140   GLUCOSE 292 (H) 05/13/2022 1140   BUN 15 05/13/2022 1140   BUN 11 08/06/2021 1613   CREATININE 0.50 05/13/2022 1838   CALCIUM 9.1 05/13/2022 1140   GFRNONAA >60 05/13/2022 1838   GFRAA >60 07/11/2018 2016    INR    Component Value Date/Time   INR 1.0 10/09/2021 2033     Intake/Output Summary (Last 24 hours) at 05/14/2022 0747 Last data filed at 05/14/2022 0300 Gross per 24 hour  Intake 3069.9 ml  Output 1550 ml  Net 1519.9 ml      Assessment/Plan:  42 y.o. female is 1 day post op, s/p: right groin exploration with hematoma evacuation and placement of Kerecis   -Tolerated hematoma evacuation yesterday well. No evidence of further bleeding. Right groin is tender but soft. Incision  intact with staples and then prevena vac. This will stay on for 5-7 days -Hgb stable at 11.8 -RLE warm and well perfused with palpable DP/PT pulses -She has been able to walk on her own and feels comfortable going home. She will get her Xarelto this morning and if she tolerates it well, I will discharge her home early this afternoon -Current follow up is in place on the 17th for staple removal from fasciotomy sites  Loel Dubonnet, [place] [facility] [phone] 05/14/2022 7:47 AM   I agree with the above.  POD#1.  Palpable right DP.  Provena intact with good seal.  I would like for her to get her Eliquis this am and be monitored for bleeding for a few hours.  If she does ok, she can be discharged this afternoon.  SHe has follow up with Karin Lieu in 2 weeks.  Durene Cal

## 2022-05-14 NOTE — Anesthesia Postprocedure Evaluation (Signed)
Anesthesia Post Note  Patient: [name] [name]  Procedure(s) Performed: RIGHTGROIN EXPLORATION , HEMATOMA EVACUATION (Right: Groin) APPLICATION OF PREVENA WOUND VAC (Right: Groin)     Patient location during evaluation: PACU Anesthesia Type: General Level of consciousness: awake and alert Pain management: pain level controlled Vital Signs Assessment: post-procedure vital signs reviewed and stable Respiratory status: spontaneous breathing, nonlabored ventilation, respiratory function stable and patient connected to nasal cannula oxygen Cardiovascular status: blood pressure returned to baseline and stable Postop Assessment: no apparent nausea or vomiting Anesthetic complications: no   No notable events documented.  Last Vitals:  Vitals:   05/14/22 0813 05/14/22 1239  BP: 119/67 (!) 107/91  Pulse: 69 78  Resp: 18 20  Temp: 36.6 C   SpO2: 99% 100%    Last Pain:  Vitals:   05/14/22 1235  TempSrc:   PainSc: 9                  Kennieth Rad

## 2022-05-14 NOTE — Telephone Encounter (Signed)
Pharmacy Patient Advocate Encounter  Insurance verification completed.    The patient is insured through [employer]   The patient is currently admitted and ran test claims for the following: Semglee Pen.  Copays and coinsurance results were relayed to Inpatient clinical team.

## 2022-05-14 NOTE — Progress Notes (Signed)
PHARMACIST LIPID MONITORING   [name] [name] is a 42 y.o. female admitted on 05/13/2022 with right lower extremity thromboembolectomy with fasciotomies now s/p hematoma evacuation.  Pharmacy has been consulted to optimize lipid-lowering therapy with the indication of secondary prevention for clinical ASCVD.  Recent Labs:  Lipid Panel (last 6 months):   Lab Results  Component Value Date   CHOL 138 05/08/2022   TRIG 347 (H) 05/08/2022   HDL 30 (L) 05/08/2022   CHOLHDL 4.6 05/08/2022   VLDL 69 (H) 05/08/2022   LDLCALC 39 05/08/2022    Hepatic function panel (last 6 months):   Lab Results  Component Value Date   AST 15 05/13/2022   ALT 15 05/13/2022   ALKPHOS 75 05/13/2022   BILITOT 0.3 05/13/2022    SCr (since admission):   Serum creatinine: 0.5 mg/dL 16/10/96 0454 Estimated creatinine clearance: 88.7 mL/min  Current therapy and lipid therapy tolerance Current lipid-lowering therapy: rosuvastatin Previous lipid-lowering therapies (if applicable): n/a Documented or reported allergies or intolerances to lipid-lowering therapies: none  Assessment:   Patient was just started on rosuvastatin 4/26 after thrombectomy.   Plan:    1.Statin intensity (high intensity recommended for all patients regardless of the LDL):  No statin changes. The patient is already on a high intensity statin.  2.Add ezetimibe (if any one of the following):   Not indicated at this time.  3.Refer to lipid clinic:   No  4.Follow-up with:  Primary care provider - [facility]  5.Follow-up labs after discharge:  Changes in lipid therapy were made. Check a lipid panel in 8-12 weeks then annually.     Alphia Moh, PharmD, BCPS, BCCP Clinical Pharmacist  Please check AMION for all [facility] Pharmacy phone numbers After 10:00 PM, call Main Pharmacy [phone]

## 2022-05-14 NOTE — Evaluation (Signed)
Occupational Therapy Evaluation Patient Details Name: [name] [name] MRN: [id number] DOB: 1980/01/20 Today's Date: 05/14/2022   History of Present Illness Pt is a 42 year old female admitted on 05/13/22 after feeling like she pulled something at her incision site. 4/25 s/p RLE fem pop BPG with 4 compartment fasciotomy and VAC placement. PMHx: MDD, T2DM, GERD, HTN, retinopathy, PTSD   Clinical Impression   Pt is at Ind baseline level of function with ADLs and ADL mobility without difficulty and using no ADs. PTA pt lived at home with family and was Ind. All education completed and no further acute OT services are indicated at this time. OT will sign off      Recommendations for follow up therapy are one component of a multi-disciplinary discharge planning process, led by the attending physician.  Recommendations may be updated based on patient status, additional functional criteria and insurance authorization.   Assistance Recommended at Discharge PRN  Patient can return home with the following Assistance with cooking/housework;Assist for transportation    Functional Status Assessment  Patient has not had a recent decline in their functional status  Equipment Recommendations  BSC/3in1;Tub/shower seat;Other (comment) Lexicographer)    Recommendations for Other Services       Precautions / Restrictions Precautions Precautions: Fall Precaution Comments: R LE VAC Restrictions Weight Bearing Restrictions: Yes RLE Weight Bearing: Weight bearing as tolerated      Mobility Bed Mobility Overal bed mobility: Modified Independent Bed Mobility: Supine to Sit, Sit to Supine     Supine to sit: Modified independent (Device/Increase time) Sit to supine: Modified independent (Device/Increase time)        Transfers Overall transfer level: Independent Equipment used: None Transfers: Sit to/from Stand Sit to Stand: Independent                  Balance Overall balance assessment: Mild  deficits observed, not formally tested   Sitting balance-Leahy Scale: Good                                     ADL either performed or assessed with clinical judgement   ADL Overall ADL's : Modified independent;Independent;At baseline                                             Vision Ability to See in Adequate Light: 0 Adequate Patient Visual Report: No change from baseline       Perception     Praxis      Pertinent Vitals/Pain Pain Assessment Pain Assessment: 0-10 Pain Score: 8  Pain Location: RLE Pain Descriptors / Indicators: Aching, Constant, Tightness Pain Intervention(s): Monitored during session     Hand Dominance Right   Extremity/Trunk Assessment Upper Extremity Assessment Upper Extremity Assessment: Overall WFL for tasks assessed   Lower Extremity Assessment Lower Extremity Assessment: Defer to PT evaluation   Cervical / Trunk Assessment Cervical / Trunk Assessment: Normal   Communication Communication Communication: No difficulties   Cognition Arousal/Alertness: Awake/alert Behavior During Therapy: WFL for tasks assessed/performed Overall Cognitive Status: Within Functional Limits for tasks assessed                                       General  Comments  VSS on RA    Exercises     Shoulder Instructions      Home Living Family/patient expects to be discharged to:: Private residence Living Arrangements: Children;Parent Available Help at Discharge: Family;Available 24 hours/day Type of Home: Mobile home Home Access: Stairs to enter Entrance Stairs-Number of Steps: 4 Entrance Stairs-Rails: Right;Left;Can reach both Home Layout: One level     Bathroom Shower/Tub: Walk-in shower;Tub/shower unit   Bathroom Toilet: Standard Bathroom Accessibility: No   Home Equipment: Agricultural consultant (2 wheels);Hand held shower head;BSC/3in1;Shower seat          Prior Functioning/Environment Prior  Level of Function : Independent/Modified Independent;Driving;Working/employed             Mobility Comments: independent no AD ADLs Comments: Ind with ADLs, IADLs        OT Problem List: Decreased activity tolerance;Impaired balance (sitting and/or standing);Pain      OT Treatment/Interventions:      OT Goals(Current goals can be found in the care plan section) Acute Rehab OT Goals Patient Stated Goal: go home  OT Frequency:      Co-evaluation     PT goals addressed during session: Mobility/safety with mobility OT goals addressed during session: ADL's and self-care;Proper use of Adaptive equipment and DME      AM-PAC OT "6 Clicks" Daily Activity     Outcome Measure Help from another person eating meals?: None Help from another person taking care of personal grooming?: None Help from another person toileting, which includes using toliet, bedpan, or urinal?: None Help from another person bathing (including washing, rinsing, drying)?: None Help from another person to put on and taking off regular upper body clothing?: None Help from another person to put on and taking off regular lower body clothing?: None 6 Click Score: 24   End of Session    Activity Tolerance: Patient tolerated treatment well Patient left: in bed;with call bell/phone within reach (sitting EOB)  OT Visit Diagnosis: Unsteadiness on feet (R26.81);Pain Pain - Right/Left: Right Pain - part of body: Leg;Ankle and joints of foot                Time: 2952-8413 OT Time Calculation (min): 23 min Charges:  OT General Charges $OT Visit: 1 Visit OT Evaluation $OT Eval Low Complexity: 1 Low OT Treatments $Self Care/Home Management : 8-22 mins    Galen Manila 05/14/2022, 12:45 PM

## 2022-05-14 NOTE — Progress Notes (Signed)
discharge instructions given. Patient verbalized understanding and all questions were answered.

## 2022-05-14 NOTE — Evaluation (Signed)
Physical Therapy Evaluation Patient Details Name: [name] [name] MRN: [id number] DOB: [birth year] Today's Date: 05/14/2022  History of Present Illness  Pt is a 42 year old female admitted on 05/13/22 after feeling like she pulled something at her incision site. 4/25 s/p RLE fem pop BPG with 4 compartment fasciotomy and VAC placement. PMHx: MDD, T2DM, GERD, HTN, retinopathy, PTSD  Clinical Impression  Pt presents with admitting diagnosis above. Co treat with OT. Pt was able to ambulate in hallway independently and navigate stairs. Pt presents at or near baseline mobility. Pt has no further acute PT needs and will be signing off. Re consult PT if mobility status changes.      Recommendations for follow up therapy are one component of a multi-disciplinary discharge planning process, led by the attending physician.  Recommendations may be updated based on patient status, additional functional criteria and insurance authorization.  Follow Up Recommendations       Assistance Recommended at Discharge PRN  Patient can return home with the following  Assistance with cooking/housework    Equipment Recommendations None recommended by PT  Recommendations for Other Services       Functional Status Assessment Patient has had a recent decline in their functional status and/or demonstrates limited ability to make significant improvements in function in a reasonable and predictable amount of time     Precautions / Restrictions Precautions Precautions: Fall Restrictions Weight Bearing Restrictions: Yes RLE Weight Bearing: Weight bearing as tolerated      Mobility  Bed Mobility Overal bed mobility: Modified Independent Bed Mobility: Supine to Sit, Sit to Supine     Supine to sit: Modified independent (Device/Increase time) Sit to supine: Modified independent (Device/Increase time)        Transfers Overall transfer level: Independent Equipment used: None Transfers: Sit to/from Stand Sit  to Stand: Independent                Ambulation/Gait Ambulation/Gait assistance: Independent Gait Distance (Feet): 500 Feet Assistive device: None Gait Pattern/deviations: Step-through pattern, Decreased stride length, Decreased stance time - right Gait velocity: decreased     General Gait Details: no LOB noted.  Stairs Stairs: Yes Stairs assistance: Supervision Stair Management: Step to pattern, Forwards, Two rails Number of Stairs: 4 General stair comments: no LOB noted  Wheelchair Mobility    Modified Rankin (Stroke Patients Only)       Balance Overall balance assessment: Mild deficits observed, not formally tested                                           Pertinent Vitals/Pain Pain Assessment Pain Assessment: 0-10 Pain Score: 8  Pain Location: RLE Pain Descriptors / Indicators: Aching, Constant Pain Intervention(s): Monitored during session    Home Living Family/patient expects to be discharged to:: Private residence Living Arrangements: Children;Parent Available Help at Discharge: Family;Available 24 hours/day Type of Home: Mobile home Home Access: Stairs to enter Entrance Stairs-Rails: Right;Left;Can reach both Entrance Stairs-Number of Steps: 4   Home Layout: One level Home Equipment: Agricultural consultant (2 wheels);Hand held shower head;BSC/3in1;Shower seat      Prior Function Prior Level of Function : Independent/Modified Independent;Driving;Working/employed             Mobility Comments: independent no AD ADLs Comments: indepedent in bADLs/iADLs     Hand Dominance   Dominant Hand: Right    Extremity/Trunk Assessment  Upper Extremity Assessment Upper Extremity Assessment: Overall WFL for tasks assessed    Lower Extremity Assessment Lower Extremity Assessment: Overall WFL for tasks assessed    Cervical / Trunk Assessment Cervical / Trunk Assessment: Normal  Communication   Communication: No difficulties   Cognition Arousal/Alertness: Awake/alert Behavior During Therapy: WFL for tasks assessed/performed Overall Cognitive Status: Within Functional Limits for tasks assessed                                          General Comments General comments (skin integrity, edema, etc.): VSS on RA    Exercises     Assessment/Plan    PT Assessment Patient does not need any further PT services  PT Problem List Decreased activity tolerance;Decreased mobility       PT Treatment Interventions      PT Goals (Current goals can be found in the Care Plan section)       Frequency Other (Comment) (DCPT)     Co-evaluation PT/OT/SLP Co-Evaluation/Treatment: Yes   PT goals addressed during session: Mobility/safety with mobility OT goals addressed during session: ADL's and self-care;Proper use of Adaptive equipment and DME       AM-PAC PT "6 Clicks" Mobility  Outcome Measure Help needed turning from your back to your side while in a flat bed without using bedrails?: None Help needed moving from lying on your back to sitting on the side of a flat bed without using bedrails?: None Help needed moving to and from a bed to a chair (including a wheelchair)?: None Help needed standing up from a chair using your arms (e.g., wheelchair or bedside chair)?: None Help needed to walk in hospital room?: None Help needed climbing 3-5 steps with a railing? : A Little 6 Click Score: 23    End of Session         PT Visit Diagnosis: Other abnormalities of gait and mobility (R26.89)    Time: 4098-1191 PT Time Calculation (min) (ACUTE ONLY): 23 min   Charges:   PT Evaluation $PT Eval Low Complexity: 1 Low PT Treatments $Gait Training: 8-22 mins        [name] [name], PT, DPT Acute Rehab Services [phone]   Gladys Damme 05/14/2022, 12:18 PM

## 2022-05-14 NOTE — TOC Benefit Eligibility Note (Signed)
Patient Product/process development scientist completed.    The patient is currently admitted and upon discharge could be taking Semglee Pen.  The current 30 day co-pay is $65.00.   The patient is insured through [employer]   This test claim was processed through [facility]- copay amounts may vary at other pharmacies due to pharmacy/plan contracts, or as the patient moves through the different stages of their insurance plan.  Roland Earl, CPHT Pharmacy Patient Advocate Specialist [facility] Patient Advocate Team Direct Number: [phone]  Fax: [phone]

## 2022-05-14 NOTE — Discharge Instructions (Signed)
Keep Pravena wound vac on your groin incision until it loses it seal in about 7-10 days.  Once that happens, you can remove and then wash the groin wound with soap and water daily and pat dry. (No tub bath-only shower)  Then put a dry gauze or washcloth in the groin to keep this area dry to help prevent wound infection.  Do this daily and as needed.  Do not use Vaseline or neosporin on your incisions.  Only use soap and water on your incisions and then protect and keep dry.  

## 2022-05-15 ENCOUNTER — Telehealth: Payer: Self-pay

## 2022-05-15 NOTE — Transitions of Care (Post Inpatient/ED Visit) (Signed)
05/15/2022  Name: [name] [name] MRN: [id number] DOB: 1980/11/19  Today's TOC FU Call Status: Today's TOC FU Call Status:: Successful TOC FU Call Competed TOC FU Call Complete Date: 05/15/22  Transition Care Management Follow-up Telephone Call Date of Discharge: 05/14/22 Discharge Facility: [facility]) Type of Discharge: Inpatient Admission Primary Inpatient Discharge Diagnosis:: Hematoma of Right Thigh How have you been since you were released from the hospital?: Same (per patient stated she is Sore) Any questions or concerns?: No  Items Reviewed: Did you receive and understand the discharge instructions provided?: Yes Medications obtained,verified, and reconciled?: Yes (Medications Reviewed) Any new allergies since your discharge?: No Dietary orders reviewed?: No Do you have support at home?: Yes People in Home: significant other  Medications Reviewed Today: Medications Reviewed Today     Reviewed by Milus Mallick, CMA (Certified Medical Assistant) on 05/15/22 at 1300  Med List Status: <None>   Medication Order Taking? Sig Documenting Provider Last Dose Status Informant  aspirin EC 81 MG tablet [id number] Yes Take 1 tablet (81 mg total) by mouth daily at 6 (six) AM. Swallow whole.  Patient taking differently: Take 81 mg by mouth daily at 6 (six) AM.   Baglia, Corrina, PA-C Taking Active Self  Blood Glucose Monitoring Suppl (ACCU-CHEK GUIDE) w/Device KIT [id number] Yes 1 each by Other route 4 (four) times daily -  before meals and at bedtime. Rema Fendt, NP Taking Active Self  gabapentin (NEURONTIN) 300 MG capsule [id number] Yes TAKE 1 CAPSULE BY MOUTH THREE TIMES DAILY Rema Fendt, NP Taking Active Self  glucose blood (ACCU-CHEK GUIDE) test strip [id number] Yes Use as instructed Rema Fendt, NP Taking Active Self  insulin glargine-yfgn (SEMGLEE) 100 UNIT/ML Pen [id number] Yes Inject 28 Units into the skin at bedtime. Graceann Congress, PA-C Taking Active Self   Insulin Pen Needle (PEN NEEDLES 3/16") 31G X 5 MM MISC [id number] Yes 1 each by Does not apply route daily. Use to inject insulin Baglia, Corrina, PA-C Taking Active Self  Lancets (ACCU-CHEK MULTICLIX) lancets [id number] Yes Use as instructed Rema Fendt, NP Taking Active Self  lisinopril (ZESTRIL) 40 MG tablet [id number] Yes Take 1 tablet by mouth once daily Rema Fendt, NP Taking Active Self  metFORMIN (GLUCOPHAGE-XR) 500 MG 24 hr tablet [id number] Yes TAKE 2 TABLETS BY MOUTH TWICE DAILY WITH A MEAL  Patient taking differently: Take 1,000 mg by mouth 2 (two) times daily with a meal.   Rema Fendt, NP Taking Active Self  oxyCODONE-acetaminophen (PERCOCET/ROXICET) 5-325 MG tablet [id number] Yes Take 1-2 tablets by mouth every 4 (four) hours as needed for moderate pain. Graceann Congress, PA-C Taking Active Self  rivaroxaban (XARELTO) 20 MG TABS tablet [id number] Yes Take 1 tablet (20 mg total) by mouth daily.  Patient taking differently: Take 20 mg by mouth daily with supper.   Graceann Congress, PA-C Taking Active Self  rosuvastatin (CRESTOR) 20 MG tablet [id number] Yes Take 1 tablet (20 mg total) by mouth daily.  Patient taking differently: Take 20 mg by mouth every evening.   Graceann Congress, PA-C Taking Active Self            Home Care and Equipment/Supplies: Were Home Health Services Ordered?: No Any new equipment or medical supplies ordered?: No  Functional Questionnaire: Do you need assistance with bathing/showering or dressing?: No Do you need assistance with meal preparation?: No Do you need assistance with eating?: No Do you have difficulty maintaining continence: No Do you need assistance with  getting out of bed/getting out of a chair/moving?: No Do you have difficulty managing or taking your medications?: No  Follow up appointments reviewed: PCP Follow-up appointment confirmed?: NA MD Provider Line Number:[phone] Given: Yes Specialist Hospital Follow-up  appointment confirmed?: Yes Date of Specialist follow-up appointment?: 05/29/22 Follow-Up Specialty Provider:: [facility] Do you need transportation to your follow-up appointment?: No Do you understand care options if your condition(s) worsen?: Yes-patient verbalized understanding    SIGNATURE  Kandis Cocking, CMA (AAMA)  [facility] [phone]

## 2022-05-17 NOTE — Discharge Summary (Signed)
Discharge Summary  Patient ID: [name] [name] [id number] 42 y.o. 109/02/1980  Admit date: 05/13/2022  Discharge date and time: 05/14/2022  1:42 PM   Admitting Physician: Nada Libman, MD   Discharge Physician: Nada Libman, MD   Admission Diagnoses: Hematoma of right thigh, initial encounter [S70.11XA]  Discharge Diagnoses: Hematoma of right thigh, initial encounter [S70.11XA]  Admission Condition: fair  Discharged Condition: good  Indication for Admission: This is a 42 year old female who recently underwent right lower extremity thromboembolectomy with fasciotomies. While at home this morning she felt a pop and then developed a swelling in her right groin. Her Prevena wound VAC filled out with fluid and she came to the emergency department. She had a CT scan that showed that the artery was intact without pseudoaneurysm however there did appear to be active extravasation I discussed going to the operating room for exploration.   Hospital Course: The patient was admitted to the hospital on 05/13/2022 and underwent: right groin exploration with hematoma evacuation, placement of Kerecis skin substitute, and placement of Prevena wound vac. She tolerated the procedure well and was transferred to the PACU in stable condition.  By POD 1 she had some moderate but tolerable pain in the right groin. Her prevena vac was intact with good suction. There was no evidence of further bleeding.   She was restarted on her Xarelto on POD1 with no bleeding issues. She tolerated a normal diet and was able to ambulate without difficulty.  She was discharged home on POD1.  Consults: None  Treatments: IV hydration, antibiotics: Ancef, analgesia: acetaminophen, anticoagulation: Xarelto, and surgery: right groin exploration with hematoma evacuation, placement of Kerecis skin substitute, and placement of Prevena wound vac  Discharge Exam: See progress note  Vitals:   05/14/22 0813 05/14/22 1239  BP:  119/67 (!) 107/91  Pulse: 69 78  Resp: 18 20  Temp: 97.9 F (36.6 C)   SpO2: 99% 100%     Disposition: Discharge disposition: 01-Home or Self Care       Patient Instructions:  Allergies as of 05/14/2022       Reactions   Sulfa Antibiotics Hives        Medication List     TAKE these medications    Accu-Chek Guide test strip Generic drug: glucose blood Use as instructed   Accu-Chek Guide w/Device Kit 1 each by Other route 4 (four) times daily -  before meals and at bedtime.   accu-chek multiclix lancets Use as instructed   aspirin EC 81 MG tablet Take 1 tablet (81 mg total) by mouth daily at 6 (six) AM. Swallow whole. What changed: additional instructions   gabapentin 300 MG capsule Commonly known as: NEURONTIN TAKE 1 CAPSULE BY MOUTH THREE TIMES DAILY   insulin glargine-yfgn 100 UNIT/ML Pen Commonly known as: SEMGLEE Inject 28 Units into the skin at bedtime.   lisinopril 40 MG tablet Commonly known as: ZESTRIL Take 1 tablet by mouth once daily   metFORMIN 500 MG 24 hr tablet Commonly known as: GLUCOPHAGE-XR TAKE 2 TABLETS BY MOUTH TWICE DAILY WITH A MEAL What changed: See the new instructions.   oxyCODONE-acetaminophen 5-325 MG tablet Commonly known as: PERCOCET/ROXICET Take 1-2 tablets by mouth every 4 (four) hours as needed for moderate pain.   Pen Needles 3/16" 31G X 5 MM Misc 1 each by Does not apply route daily. Use to inject insulin   rivaroxaban 20 MG Tabs tablet Commonly known as: XARELTO Take 1 tablet (20 mg total) by  Discharge Summary  Patient ID: [name] [name] [id number] 42 y.o. 103/02/1980  Admit date: 05/13/2022  Discharge date and time: 05/14/2022  1:42 PM   Admitting Physician: Nada Libman, MD   Discharge Physician: Nada Libman, MD   Admission Diagnoses: Hematoma of right thigh, initial encounter [S70.11XA]  Discharge Diagnoses: Hematoma of right thigh, initial encounter [S70.11XA]  Admission Condition: fair  Discharged Condition: good  Indication for Admission: This is a 42 year old female who recently underwent right lower extremity thromboembolectomy with fasciotomies. While at home this morning she felt a pop and then developed a swelling in her right groin. Her Prevena wound VAC filled out with fluid and she came to the emergency department. She had a CT scan that showed that the artery was intact without pseudoaneurysm however there did appear to be active extravasation I discussed going to the operating room for exploration.   Hospital Course: The patient was admitted to the hospital on 05/13/2022 and underwent: right groin exploration with hematoma evacuation, placement of Kerecis skin substitute, and placement of Prevena wound vac. She tolerated the procedure well and was transferred to the PACU in stable condition.  By POD 1 she had some moderate but tolerable pain in the right groin. Her prevena vac was intact with good suction. There was no evidence of further bleeding.   She was restarted on her Xarelto on POD1 with no bleeding issues. She tolerated a normal diet and was able to ambulate without difficulty.  She was discharged home on POD1.  Consults: None  Treatments: IV hydration, antibiotics: Ancef, analgesia: acetaminophen, anticoagulation: Xarelto, and surgery: right groin exploration with hematoma evacuation, placement of Kerecis skin substitute, and placement of Prevena wound vac  Discharge Exam: See progress note  Vitals:   05/14/22 0813 05/14/22 1239  BP:  119/67 (!) 107/91  Pulse: 69 78  Resp: 18 20  Temp: 97.9 F (36.6 C)   SpO2: 99% 100%     Disposition: Discharge disposition: 01-Home or Self Care       Patient Instructions:  Allergies as of 05/14/2022       Reactions   Sulfa Antibiotics Hives        Medication List     TAKE these medications    Accu-Chek Guide test strip Generic drug: glucose blood Use as instructed   Accu-Chek Guide w/Device Kit 1 each by Other route 4 (four) times daily -  before meals and at bedtime.   accu-chek multiclix lancets Use as instructed   aspirin EC 81 MG tablet Take 1 tablet (81 mg total) by mouth daily at 6 (six) AM. Swallow whole. What changed: additional instructions   gabapentin 300 MG capsule Commonly known as: NEURONTIN TAKE 1 CAPSULE BY MOUTH THREE TIMES DAILY   insulin glargine-yfgn 100 UNIT/ML Pen Commonly known as: SEMGLEE Inject 28 Units into the skin at bedtime.   lisinopril 40 MG tablet Commonly known as: ZESTRIL Take 1 tablet by mouth once daily   metFORMIN 500 MG 24 hr tablet Commonly known as: GLUCOPHAGE-XR TAKE 2 TABLETS BY MOUTH TWICE DAILY WITH A MEAL What changed: See the new instructions.   oxyCODONE-acetaminophen 5-325 MG tablet Commonly known as: PERCOCET/ROXICET Take 1-2 tablets by mouth every 4 (four) hours as needed for moderate pain.   Pen Needles 3/16" 31G X 5 MM Misc 1 each by Does not apply route daily. Use to inject insulin   rivaroxaban 20 MG Tabs tablet Commonly known as: XARELTO Take 1 tablet (20 mg total) by

## 2022-05-20 ENCOUNTER — Telehealth: Payer: Self-pay

## 2022-05-20 DIAGNOSIS — Z0279 Encounter for issue of other medical certificate: Secondary | ICD-10-CM

## 2022-05-20 NOTE — Telephone Encounter (Signed)
Pt called for advice concerning wound vac and incision care following vac removal.  Reviewed pt's chart, returned call for clarification, two identifiers used. Reviewed d/c instructions and gave her showering information. Confirmed understanding.

## 2022-05-21 ENCOUNTER — Telehealth: Payer: Self-pay

## 2022-05-21 NOTE — Telephone Encounter (Signed)
Pt called stating that her wound vac turned off, she took the dressing off, and there is a lot of drainage.  Reviewed pt's chart, returned call for clarification, two identifiers used. Pt stated the wound vac turned off at 0130 and she took the dressing off around 0800. She noted that there was hardly any drainage in the vac canister. She cleaned it well and then noted some serosanguinous drainage. The area is still soft and there is no odor. She has changed the gauze bandage twice today, but it has not become saturated at any time. She does note some swelling in the leg and some mild pain that has not changed. Instructed her to keep monitoring for S&S of infection, keep the site clean and dry, change the bandage when soiled. Instructed to call back if symptoms worsened or drainage increased significantly or changed to more bloody. Confirmed understanding.

## 2022-05-25 ENCOUNTER — Telehealth: Payer: Self-pay

## 2022-05-25 NOTE — Telephone Encounter (Signed)
I reached out to [name] [name] regarding FMLA paperwork. I advised pt the paperwork has been completed, faxed & successful fax confirmation was received. Patient voiced her understanding.

## 2022-05-27 ENCOUNTER — Ambulatory Visit: Payer: No Typology Code available for payment source

## 2022-05-27 NOTE — Progress Notes (Signed)
Office Note    HPI: [name] [name] is a 42 y.o. (Apr 04, 1980) female presenting in follow up s/p Right leg iliofemoral, femoropopliteal embolectomy for acute limb ischemia. Postop course complicated by spontaneous bleed POD5, requiring hematoma evacuation.  On exam today, [name] was doing well, accompanied by her mom.  She has appreciated significant right lower extremity swelling which is worse by days and.  She denies drainage, and has been working hard to keep the right groin dry.  Denies fevers chills.  Denies symptoms of claudication, ischemic rest pain, tissue loss.  Does appreciate some cutaneous paresthesias in the right lower extremity around the fasciotomy sites.   The pt is  on a statin for cholesterol management.  The pt is  on a daily aspirin.   Other AC:  xarelto The pt is  on medication for hypertension.   The pt is  diabetic.  Tobacco hx:  none  Past Medical History:  Diagnosis Date   Anxiety    Back fracture    Depression    Diabetes mellitus without complication (HCC)    Family history of adverse reaction to anesthesia    sister gets nausea and vomitting and itching on her face. Around her nose and mouth.   Fatty liver disease, nonalcoholic    GERD (gastroesophageal reflux disease)    H/O cesarean section complicating pregnancy 04/18/2014   Hypertension    Neuromuscular disorder (HCC)    eye twitch on left eye    Obesity    Pelvis fracture (HCC)    S/P cesarean section 06/01/2014   S/P tubal ligation 06/01/2014    Past Surgical History:  Procedure Laterality Date   APPLICATION OF WOUND VAC Right 05/13/2022   Procedure: APPLICATION OF PREVENA WOUND VAC;  Surgeon: Nada Libman, MD;  Location: [facility];  Service: Vascular;  Laterality: Right;   CESAREAN SECTION     CESAREAN SECTION WITH BILATERAL TUBAL LIGATION Bilateral 06/01/2014   Procedure: CESAREAN SECTION WITH BILATERAL TUBAL LIGATION;  Surgeon: Sherian Rein, MD;  Location: [facility];  Service:  Obstetrics;  Laterality: Bilateral;  MD requests RNFA   CHEST TUBE INSERTION     FEMORAL ARTERY EXPLORATION Right 05/13/2022   Procedure: RIGHTGROIN EXPLORATION , HEMATOMA EVACUATION;  Surgeon: Nada Libman, MD;  Location: [facility];  Service: Vascular;  Laterality: Right;   FEMORAL-POPLITEAL BYPASS GRAFT Right 05/07/2022   Procedure: RIGHT FEMORAL-POPLITEAL ARTERY EMBOLECTOMY AND 4  COMPARMENT FASCIOTOMY;  Surgeon: [name], MD;  Location: [facility] OR;  Service: Vascular;  Laterality: Right;   TONSILLECTOMY     TRACHEOSTOMY      Social History   Socioeconomic History   Marital status: Legally Separated    Spouse name: Not on file   Number of children: Not on file   Years of education: Not on file   Highest education level: Not on file  Occupational History   Not on file  Tobacco Use   Smoking status: Every Day    Packs/day: 2.00    Years: 12.00    Additional pack years: 0.00    Total pack years: 24.00    Types: Cigarettes    Passive exposure: Current   Smokeless tobacco: Never  Vaping Use   Vaping Use: Never used  Substance and Sexual Activity   Alcohol use: No   Drug use: No   Sexual activity: Yes    Birth control/protection: None  Other Topics Concern   Not on file  Social History Narrative   Not on  file   Social Determinants of Health   Financial Resource Strain: Not on file  Food Insecurity: Patient Declined (05/14/2022)   Hunger Vital Sign    Worried About Running Out of Food in the Last Year: Patient declined    Ran Out of Food in the Last Year: Patient declined  Transportation Needs: Patient Declined (05/14/2022)   PRAPARE - Administrator, Civil Service (Medical): Patient declined    Lack of Transportation (Non-Medical): Patient declined  Physical Activity: Not on file  Stress: Not on file  Social Connections: Not on file  Intimate Partner Violence: Patient Declined (05/14/2022)   Humiliation, Afraid, Rape, and Kick questionnaire    Fear of  Current or Ex-Partner: Patient declined    Emotionally Abused: Patient declined    Physically Abused: Patient declined    Sexually Abused: Patient declined   Family History  Problem Relation Age of Onset   Diabetes Maternal Grandmother    Diabetes Paternal Grandmother     Current Outpatient Medications  Medication Sig Dispense Refill   aspirin EC 81 MG tablet Take 1 tablet (81 mg total) by mouth daily at 6 (six) AM. Swallow whole. (Patient taking differently: Take 81 mg by mouth daily at 6 (six) AM.) 30 tablet 12   Blood Glucose Monitoring Suppl (ACCU-CHEK GUIDE) w/Device KIT 1 each by Other route 4 (four) times daily -  before meals and at bedtime. 1 kit 0   gabapentin (NEURONTIN) 300 MG capsule TAKE 1 CAPSULE BY MOUTH THREE TIMES DAILY 270 capsule 0   glucose blood (ACCU-CHEK GUIDE) test strip Use as instructed 100 each 12   insulin glargine-yfgn (SEMGLEE) 100 UNIT/ML Pen Inject 28 Units into the skin at bedtime. 15 mL 2   Insulin Pen Needle (PEN NEEDLES 3/16") 31G X 5 MM MISC 1 each by Does not apply route daily. Use to inject insulin 100 each 0   Lancets (ACCU-CHEK MULTICLIX) lancets Use as instructed 100 each 12   lisinopril (ZESTRIL) 40 MG tablet Take 1 tablet by mouth once daily 90 tablet 0   metFORMIN (GLUCOPHAGE-XR) 500 MG 24 hr tablet TAKE 2 TABLETS BY MOUTH TWICE DAILY WITH A MEAL (Patient taking differently: Take 1,000 mg by mouth 2 (two) times daily with a meal.) 120 tablet 2   oxyCODONE-acetaminophen (PERCOCET/ROXICET) 5-325 MG tablet Take 1-2 tablets by mouth every 4 (four) hours as needed for moderate pain. 30 tablet 0   rivaroxaban (XARELTO) 20 MG TABS tablet Take 1 tablet (20 mg total) by mouth daily. (Patient taking differently: Take 20 mg by mouth daily with supper.) 30 tablet 3   rosuvastatin (CRESTOR) 20 MG tablet Take 1 tablet (20 mg total) by mouth daily. (Patient taking differently: Take 20 mg by mouth every evening.) 30 tablet 11   No current facility-administered  medications for this visit.    Allergies  Allergen Reactions   Sulfa Antibiotics Hives     REVIEW OF SYSTEMS:  [X]  denotes positive finding, [ ]  denotes negative finding Cardiac  Comments:  Chest pain or chest pressure:    Shortness of breath upon exertion:    Short of breath when lying flat:    Irregular heart rhythm:        Vascular    Pain in calf, thigh, or hip brought on by ambulation:    Pain in feet at night that wakes you up from your sleep:     Blood clot in your veins:    Leg swelling:  Pulmonary    Oxygen at home:    Productive cough:     Wheezing:         Neurologic    Sudden weakness in arms or legs:     Sudden numbness in arms or legs:     Sudden onset of difficulty speaking or slurred speech:    Temporary loss of vision in one eye:     Problems with dizziness:         Gastrointestinal    Blood in stool:     Vomited blood:         Genitourinary    Burning when urinating:     Blood in urine:        Psychiatric    Major depression:         Hematologic    Bleeding problems:    Problems with blood clotting too easily:        Skin    Rashes or ulcers:        Constitutional    Fever or chills:      PHYSICAL EXAMINATION:  There were no vitals filed for this visit.  General:  WDWN in NAD; vital signs documented above Gait: Not observed HENT: WNL, normocephalic Pulmonary: normal non-labored breathing , without wheezing Cardiac: regular HR Abdomen: soft, NT, no masses Skin: without rashes Vascular Exam/Pulses:  Right Left  Radial 2+ (normal) 2+ (normal)  Ulnar    Femoral    Popliteal    DP 2+ (normal) 2+ (normal)  PT     Extremities: without ischemic changes, without Gangrene , without cellulitis; without open wounds;  Musculoskeletal: no muscle wasting or atrophy  Neurologic: A&O X 3;  No focal weakness or paresthesias are detected Psychiatric:  The pt has Normal affect.   Non-Invasive Vascular Imaging:    none    ASSESSMENT/PLAN: SHAWNTAI GAUR is a 42 y.o. female presenting for wound check status post Right leg iliofemoral, femoropopliteal embolectomy for acute limb ischemia. Postop course complicated by spontaneous bleed POD5, requiring hematoma evacuation.  Staples removed from the fasciotomy sites.  The lower leg was wrapped with an Ace for compression.  She was also sized for a compression stocking. Regarding the groin, my plan is to leave the staples in place for 2 weeks.  At that time, she will be evaluated in our office for staple removal.  Overall I am happy with how she is doing.  She has a palpable pulse in the right foot.  Workup was negative for atheroembolic source, echo also normal.  Likely cardioembolic.  Patient would benefit from Holter monitor.  I have sent a referral to cardiology for holter monitor.   [name], MD [facility] [phone]

## 2022-05-29 ENCOUNTER — Ambulatory Visit (INDEPENDENT_AMBULATORY_CARE_PROVIDER_SITE_OTHER): Payer: No Typology Code available for payment source | Admitting: Vascular Surgery

## 2022-05-29 ENCOUNTER — Encounter: Payer: Self-pay | Admitting: Vascular Surgery

## 2022-05-29 VITALS — BP 131/87 | HR 84 | Temp 97.6°F | Resp 16 | Ht 61.0 in | Wt 180.0 lb

## 2022-05-29 DIAGNOSIS — Z9889 Other specified postprocedural states: Secondary | ICD-10-CM

## 2022-05-29 DIAGNOSIS — I998 Other disorder of circulatory system: Secondary | ICD-10-CM

## 2022-06-01 ENCOUNTER — Other Ambulatory Visit: Payer: Self-pay | Admitting: Family

## 2022-06-02 NOTE — Telephone Encounter (Signed)
Requested medication (s) are due for refill today -no  Requested medication (s) are on the active medication list -yes  Future visit scheduled -no  Last refill: 05/09/22 15ml 2RF  Notes to clinic: off protocol- provider review - Sig is different from sig on medication list  Requested Prescriptions  Pending Prescriptions Disp Refills   SEMGLEE, YFGN, 100 UNIT/ML Pen [Pharmacy Med Name: Semglee (yfgn) 100 UNIT/ML Subcutaneous Solution Pen-injector] 15 mL 0    Sig: INJECT 15 UNITS SUBCUTANEOUSLY AT BEDTIME     Off-Protocol Failed - 06/01/2022 10:49 AM      Failed - Medication not assigned to a protocol, review manually.      Passed - Valid encounter within last 12 months    Recent Outpatient Visits           3 months ago Primary hypertension   [place] [facility], [place], [place]   4 months ago Primary hypertension   [place] [facility], Amy J, NP   7 months ago Primary hypertension   [place] [facility], Amy J, NP   8 months ago Type 2 diabetes mellitus with diabetic mononeuropathy, with long-term current use of insulin (HCC)   [place] [facility], Amy J, NP   9 months ago Type 2 diabetes mellitus with diabetic mononeuropathy, with long-term current use of insulin [facility])   [place] [facility], [place], NP       Future Appointments             In 5 months Shamleffer, Konrad Dolores, MD [facility] Endocr[facility]ology               Requested Prescriptions  Pending Prescriptions Disp Refills   SEMGLEE, YFGN, 100 UNIT/ML Pen [Pharmacy Med Name: Semglee (yfgn) 100 UNIT/ML Subcutaneous Solution Pen-injector] 15 mL 0    Sig: INJECT 15 UNITS SUBCUTANEOUSLY AT BEDTIME     Off-Protocol Failed - 06/01/2022 10:49 AM      Failed - Medication not assigned to a protocol, review manually.      Passed - Valid encounter within  last 12 months    Recent Outpatient Visits           3 months ago Primary hypertension   [place] [facility], [place], [place]   4 months ago Primary hypertension   [place] [facility], Amy J, NP   7 months ago Primary hypertension   [place] [facility], Amy J, NP   8 months ago Type 2 diabetes mellitus with diabetic mononeuropathy, with long-term current use of insulin [facility])   [place] [facility], [place] J, NP   9 months ago Type 2 diabetes mellitus with diabetic mononeuropathy, with long-term current use of insulin [facility])   [place] [facility], [place], NP       Future Appointments             In 5 months Shamleffer, Konrad Dolores, MD [facility] Endocrinology

## 2022-06-09 ENCOUNTER — Other Ambulatory Visit: Payer: Self-pay

## 2022-06-09 ENCOUNTER — Encounter: Payer: Self-pay | Admitting: Physician Assistant

## 2022-06-09 ENCOUNTER — Telehealth: Payer: Self-pay

## 2022-06-09 ENCOUNTER — Ambulatory Visit (INDEPENDENT_AMBULATORY_CARE_PROVIDER_SITE_OTHER): Payer: No Typology Code available for payment source | Admitting: Physician Assistant

## 2022-06-09 ENCOUNTER — Encounter (HOSPITAL_COMMUNITY): Payer: Self-pay | Admitting: Vascular Surgery

## 2022-06-09 VITALS — BP 132/84 | HR 110 | Temp 98.0°F | Resp 18 | Ht 61.0 in | Wt 180.0 lb

## 2022-06-09 DIAGNOSIS — T8149XA Infection following a procedure, other surgical site, initial encounter: Secondary | ICD-10-CM

## 2022-06-09 DIAGNOSIS — Z9889 Other specified postprocedural states: Secondary | ICD-10-CM

## 2022-06-09 DIAGNOSIS — T8142XA Infection following a procedure, deep incisional surgical site, initial encounter: Secondary | ICD-10-CM

## 2022-06-09 MED ORDER — AMOXICILLIN-POT CLAVULANATE 875-125 MG PO TABS: 1.0000 | ORAL_TABLET | Freq: Two times a day (BID) | ORAL | 0 refills | Status: DC

## 2022-06-09 NOTE — Progress Notes (Signed)
SDW call  Patient was given pre-op instructions over the phone. Patient verbalized understanding of instructions provided.    PCP - Delfin Gant, PA-C Cardiologist -  Pulmonary:    PPM/ICD - Denies   Chest x-ray - n/a EKG -  05/07/2022 Stress Test - ECHO - 05/09/2022 Cardiac Cath -   Sleep Study/sleep apnea/CPAP: Denies  Type II diabetic Fasting Blood sugar range: Does not check her blood sugar How often check sugars: Does not check her blood sugar   Blood Thinner Instructions: Xarelto, last dose 06/08/2022 Aspirin Instructions: Continue   ERAS Protcol - No, NPO PRE-SURGERY Ensure or G2-    COVID TEST- n/a    Anesthesia review: Yes. DM, HTN, recent hospital discharge   Patient denies shortness of breath, fever, cough and chest pain over the phone call  Your procedure is scheduled on Wednesday Jun 10, 2022  Report to [facility] Main Entrance "A" at  0530  A.M., then check in with the Admitting office.  Call this number if you have problems the morning of surgery:  [phone]   If you have any questions prior to your surgery date call [phone]: Open Monday-Friday 8am-4pm If you experience any cold or flu symptoms such as cough, fever, chills, shortness of breath, etc. between now and your scheduled surgery, please notify us at the above number    Remember:  Do not eat or drink after midnight the night before your surgery   Take these medicines the morning of surgery with A SIP OF WATER:  Augmentin, ASA, gabapentin  As needed: tylenol  As of today, STOP taking any Aleve, Naproxen, Ibuprofen, Motrin, Advil, Goody's, BC's, all herbal medications, fish oil, and all vitamins.

## 2022-06-09 NOTE — Progress Notes (Signed)
Case: [id number] Date/Time: 06/10/22 0715   Procedure: EXPLORATION OF RIGHT GROIN (Right)   Anesthesia type: Choice   Pre-op diagnosis: Infection   [facility] ROOM 11 / [facility]   Surgeons: Cephus Shelling, MD       DISCUSSION: 42 year old female is being evaluated prior to surgery listed above. Patient is s/p right femoral-popliteal embolectomy, 4 compartment fasciotomy and prevena placement on 05/07/2022 by Dr. Karin Lieu. She has been taking Xarelto and has not missed any doses. Started to have a fever and drainage from her wound today and was seen in the Vascular clinic. They will do a washout tomorrow and admit her.  Other PMH remarkable for every day smoker, GERD, DM, obesity, NASH, HTN. Has had multiple surgeries in the past. No prior complications from anesthesia.   Patient was supposed to f/u with Cardiology for Holter monitoring to possibly find if this could be the reason for her critical limb ischemia however this has not been set up yet.   VS:  Wt Readings from Last 3 Encounters:  06/09/22 81.6 kg  05/29/22 81.6 kg  05/13/22 84.8 kg   Temp Readings from Last 3 Encounters:  06/09/22 36.7 C (Temporal)  05/29/22 36.4 C (Temporal)  05/14/22 36.6 C (Oral)   BP Readings from Last 3 Encounters:  06/09/22 132/84  05/29/22 131/87  05/14/22 (!) 107/91   Pulse Readings from Last 3 Encounters:  06/09/22 (!) 110  05/29/22 84  05/14/22 78     PROVIDERS: [facility]   LABS: Obtain DOS (all labs ordered are listed, but only abnormal results are displayed)  Labs Reviewed - No data to display   IMAGES:  CTA AO+Bifem 05/13/22:  IMPRESSION: 1. Hematoma in the right inguinal region with active bleeding. Bleeding branch may represent the superficial circumflex iliac artery. 2. Right superficial femoral artery is now patent. 3. No acute vascular pathology in the abdomen, pelvis or lower extremities.   NON-VASCULAR   1. No acute localizing  process in the abdomen or pelvis. 2. Stable ground-glass opacities in the lung bases. 3. Stable mediastinal and hilar lymphadenopathy.  CTA chest, aorta 05/08/22:  IMPRESSION: Left ventricular wall hypertrophy suggested. The heart itself is nonenlarged. Please correlate for any known history. If needed, additional workup such as echocardiography.   Several enlarged bilateral hilar and mediastinal lymph nodes. There is diffuse patchy bilateral lung opacities identified with ground-glass. Infiltrative process is possible. In the nodes could be reactive recommend follow-up to confirm clearance.   Normal caliber thoracic aorta without dissection or aneurysm formation       EKG 05/07/22:  NSR   CV:  Echo 5/28:  IMPRESSIONS     1. Left ventricular ejection fraction, by estimation, is 70 to 75%. The  left ventricle has hyperdynamic function. The left ventricle has no  regional wall motion abnormalities. There is mild left ventricular  hypertrophy. Left ventricular diastolic  parameters were normal.   2. Right ventricular systolic function is normal. The right ventricular  size is normal.   3. The mitral valve is normal in structure. No evidence of mitral valve  regurgitation. No evidence of mitral stenosis.   4. The aortic valve is tricuspid. Aortic valve regurgitation is not  visualized. No aortic stenosis is present.   5. The inferior vena cava is normal in size with <50% respiratory  variability, suggesting right atrial pressure of 8 mmHg.   Past Medical History:  Diagnosis Date   Anxiety    Back  fracture    Depression    Diabetes mellitus without complication (HCC)    Family history of adverse reaction to anesthesia    sister gets nausea and vomitting and itching on her face. Around her nose and mouth.   Fatty liver disease, nonalcoholic    GERD (gastroesophageal reflux disease)    H/O cesarean section complicating pregnancy 04/18/2014   Hypertension     Neuromuscular disorder (HCC)    eye twitch on left eye    Obesity    Pelvis fracture (HCC)    S/P cesarean section 06/01/2014   S/P tubal ligation 06/01/2014    Past Surgical History:  Procedure Laterality Date   APPLICATION OF WOUND VAC Right 05/13/2022   Procedure: APPLICATION OF PREVENA WOUND VAC;  Surgeon: Nada Libman, MD;  [facility]: [facility];  Service: Vascular;  Laterality: Right;   CESAREAN SECTION     CESAREAN SECTION WITH BILATERAL TUBAL LIGATION Bilateral 06/01/2014   Procedure: CESAREAN SECTION WITH BILATERAL TUBAL LIGATION;  Surgeon: Sherian Rein, MD;  Location: [facility];  Service: Obstetrics;  Laterality: Bilateral;  MD requests RNFA   CHEST TUBE INSERTION     FEMORAL ARTERY EXPLORATION Right 05/13/2022   Procedure: RIGHTGROIN EXPLORATION , HEMATOMA EVACUATION;  Surgeon: Nada Libman, MD;  Location: [facility];  Service: Vascular;  Laterality: Right;   FEMORAL-POPLITEAL BYPASS GRAFT Right 05/07/2022   Procedure: RIGHT FEMORAL-POPLITEAL ARTERY EMBOLECTOMY AND 4  COMPARMENT FASCIOTOMY;  Surgeon: [name], MD;  Location: [facility] OR;  Service: Vascular;  Laterality: Right;   TONSILLECTOMY     TRACHEOSTOMY      MEDICATIONS: No current facility-administered medications for this encounter.    acetaminophen (TYLENOL) 500 MG tablet   amoxicillin-clavulanate (AUGMENTIN) 875-125 MG tablet   aspirin EC 81 MG tablet   gabapentin (NEURONTIN) 300 MG capsule   lisinopril (ZESTRIL) 40 MG tablet   metFORMIN (GLUCOPHAGE-XR) 500 MG 24 hr tablet   rivaroxaban (XARELTO) 20 MG TABS tablet   rosuvastatin (CRESTOR) 20 MG tablet   Blood Glucose Monitoring Suppl (ACCU-CHEK GUIDE) w/Device KIT   glucose blood (ACCU-CHEK GUIDE) test strip   insulin glargine-yfgn (SEMGLEE) 100 UNIT/ML Pen   Insulin Pen Needle (PEN NEEDLES 3/16") 31G X 5 MM MISC   Lancets (ACCU-CHEK MULTICLIX) lancets   oxyCODONE-acetaminophen (PERCOCET/ROXICET) 5-325 MG tablet   Marcille Blanco [facility] Short  Stay/Anesthesiology [facility] Phone [phone] 06/09/2022 3:39 PM

## 2022-06-09 NOTE — Anesthesia Preprocedure Evaluation (Signed)
Anesthesia Evaluation  Patient identified by MRN, date of birth, ID band Patient awake    Reviewed: Allergy & Precautions, NPO status , Patient's Chart, lab work & pertinent test results  History of Anesthesia Complications Negative for: history of anesthetic complications  Airway Mallampati: II  TM Distance: >3 FB Neck ROM: Full    Dental  (+) Dental Advisory Given, Poor Dentition, Missing, Chipped   Pulmonary COPD, Current SmokerPatient did not abstain from smoking.   breath sounds clear to auscultation       Cardiovascular hypertension, Pt. on medications (-) angina + Peripheral Vascular Disease   Rhythm:Regular Rate:Normal  06/09/2022 ECHO: EF 70-75%, hyperdynamic LVF, mild LVH, RVF normal, no significant valvular abnormalities   Neuro/Psych   Anxiety Depression Bipolar Disorder   negative neurological ROS     GI/Hepatic ,GERD  Controlled,,NASH   Endo/Other  diabetes (glu 338), Oral Hypoglycemic Agents, Insulin Dependent  BMI 34  Renal/GU negative Renal ROS     Musculoskeletal   Abdominal   Peds  Hematology xarelto   Anesthesia Other Findings   Reproductive/Obstetrics                             Anesthesia Physical Anesthesia Plan  ASA: 3  Anesthesia Plan: General   Post-op Pain Management: Tylenol PO (pre-op)*   Induction: Intravenous  PONV Risk Score and Plan: 2 and Ondansetron  Airway Management Planned: Oral ETT  Additional Equipment: None  Intra-op Plan:   Post-operative Plan: Extubation in OR  Informed Consent: I have reviewed the patients History and Physical, chart, labs and discussed the procedure including the risks, benefits and alternatives for the proposed anesthesia with the patient or authorized representative who has indicated his/her understanding and acceptance.     Dental advisory given  Plan Discussed with: CRNA and Surgeon  Anesthesia Plan  Comments: (See PAT note from 5/28 by Sherlie Ban PA-C )        Anesthesia Quick Evaluation

## 2022-06-09 NOTE — Progress Notes (Signed)
POST OPERATIVE OFFICE NOTE    CC:  F/u for surgery  HPI:  [name] who is s/p right femoral-popliteal embolectomy, 4 compartment fasciotomy and prevena placement on 05/07/2022 by Dr. Karin Lieu.  She was discharged home on DOAC.  She returned to hospital on 05/13/2022 and was taken to the operating room and underwent evacuation of hematoma, placement of Kerecis, and prevena wound vac by Dr. Myra Gianotti.  She was seen back on 05/29/2022 by Dr. Karin Lieu and at that time, she was not having any claudication or rest pain.  She did have some paresthesias around the fasciotomy sites. She had a palpable pulse in the right foot.  Her workup was negative for atheroembolic source and she was referred to cardiology for holter monitor.    Pt returns today for follow up. She called triage this morning with c/o fevers and yellowish drainage and pain radiating upward into the pt's abdomen.  She was scheduled as a work in for evaluation.    Pt states her incision started draining a couple of days ago.  She states that it is not clear fluid and it looks thick.  She had a fever yesterday of 101.4 and took 4 tylenol to break her fever.  She is currently not on abx.  She states she has been working hard on keeping her groin dry with gauze. She is not having any pain in the right foot.  She has been compliant with her Xarelto.  She has not been called by the cardiology office yet.     Allergies  Allergen Reactions   Sulfa Antibiotics Hives    Current Outpatient Medications  Medication Sig Dispense Refill   aspirin EC 81 MG tablet Take 1 tablet (81 mg total) by mouth daily at 6 (six) AM. Swallow whole. (Patient taking differently: Take 81 mg by mouth daily at 6 (six) AM.) 30 tablet 12   Blood Glucose Monitoring Suppl (ACCU-CHEK GUIDE) w/Device KIT 1 each by Other route 4 (four) times daily -  before meals and at bedtime. 1 kit 0   gabapentin (NEURONTIN) 300 MG capsule TAKE 1 CAPSULE BY MOUTH THREE TIMES DAILY  270 capsule 0   glucose blood (ACCU-CHEK GUIDE) test strip Use as instructed 100 each 12   insulin glargine-yfgn (SEMGLEE) 100 UNIT/ML Pen Inject 28 Units into the skin at bedtime. 15 mL 2   Insulin Pen Needle (PEN NEEDLES 3/16") 31G X 5 MM MISC 1 each by Does not apply route daily. Use to inject insulin 100 each 0   Lancets (ACCU-CHEK MULTICLIX) lancets Use as instructed 100 each 12   lisinopril (ZESTRIL) 40 MG tablet Take 1 tablet by mouth once daily 90 tablet 0   metFORMIN (GLUCOPHAGE-XR) 500 MG 24 hr tablet TAKE 2 TABLETS BY MOUTH TWICE DAILY WITH A MEAL (Patient taking differently: Take 1,000 mg by mouth 2 (two) times daily with a meal.) 120 tablet 2   oxyCODONE-acetaminophen (PERCOCET/ROXICET) 5-325 MG tablet Take 1-2 tablets by mouth every 4 (four) hours as needed for moderate pain. 30 tablet 0   rivaroxaban (XARELTO) 20 MG TABS tablet Take 1 tablet (20 mg total) by mouth daily. (Patient taking differently: Take 20 mg by mouth daily with supper.) 30 tablet 3   rosuvastatin (CRESTOR) 20 MG tablet Take 1 tablet (20 mg total) by mouth daily. (Patient taking differently: Take 20 mg by mouth every evening.) 30 tablet 11   No current facility-administered medications for this visit.     ROS:  See HPI  Physical Exam:  Today's Vitals   06/09/22 1015  BP: 132/84  Pulse: (!) 110  Resp: 18  Temp: 98 F (36.7 C)  TempSrc: Temporal  SpO2: 94%  Weight: 180 lb (81.6 kg)  Height: 5\' 1"  (1.549 m)   Body mass index is 34.01 kg/m.   Incision:  right groin with staples in tact with purulent drainage from incision.  Fasciotomy sites healed nicely.  Extremities:  palpable right DP pulse.      Assessment/Plan:  [name] who is s/p: right femoral-popliteal embolectomy, 4 compartment fasciotomy and prevena placement on 05/07/2022 by Dr. Karin Lieu.  She was discharged home on DOAC.  She returned to hospital on 05/13/2022 and was taken to the operating room and underwent evacuation of  hematoma, placement of Kerecis, and prevena wound vac by Dr. Myra Gianotti.     -pt with palpable right DP pulse.  -pt with fever of 101.4 yesterday and purulent drainage from incision for a couple of days.   -pt seen with Dr. Chestine Spore.  Right groin incision grossly infected with purulent drainage. Will plan for groin washout tomorrow with Dr. Chestine Spore.  Pt will hold Xarelto for today and tomorrow.  She will need to be admitted.    -augmentin sent to [facility] on [address].  -pt given gauze for drainage from groin   Doreatha Massed, [facility] [phone]   Clinic MD:  Chestine Spore

## 2022-06-09 NOTE — Telephone Encounter (Signed)
Caller: Patient  Concern: Yellowish, pus-like drainage, fever yesterday, pain radiating upward into pt's stomach and down into pt's thigh, excessive drainage  Location: R groin  Description:  x 2-3 days  Procedure: Embolectomy and fasciotomy on 05/07/22, hematoma evacuation on 05/13/22  Resolution: Appointment scheduled for 1st available to r/o infection  Next Appt: Appointment scheduled for 0945 today on PA schedule

## 2022-06-10 ENCOUNTER — Other Ambulatory Visit: Payer: Self-pay

## 2022-06-10 ENCOUNTER — Inpatient Hospital Stay (HOSPITAL_COMMUNITY): Payer: No Typology Code available for payment source | Admitting: Certified Registered"

## 2022-06-10 ENCOUNTER — Encounter (HOSPITAL_COMMUNITY): Payer: Self-pay | Admitting: Vascular Surgery

## 2022-06-10 ENCOUNTER — Encounter (HOSPITAL_COMMUNITY)
Admission: RE | Disposition: A | Discharge status: Home Health | Payer: Self-pay | Source: Home / Self Care | Attending: Vascular Surgery

## 2022-06-10 ENCOUNTER — Inpatient Hospital Stay (HOSPITAL_COMMUNITY)
Admission: RE | Admit: 2022-06-10 | Discharge: 2022-06-15 | DRG: 858 | Disposition: A | Discharge status: Home Health | Payer: No Typology Code available for payment source | Attending: Vascular Surgery | Admitting: Vascular Surgery

## 2022-06-10 DIAGNOSIS — J449 Chronic obstructive pulmonary disease, unspecified: Secondary | ICD-10-CM

## 2022-06-10 DIAGNOSIS — E669 Obesity, unspecified: Secondary | ICD-10-CM | POA: Diagnosis present

## 2022-06-10 DIAGNOSIS — I10 Essential (primary) hypertension: Secondary | ICD-10-CM

## 2022-06-10 DIAGNOSIS — Z7984 Long term (current) use of oral hypoglycemic drugs: Secondary | ICD-10-CM | POA: Diagnosis not present

## 2022-06-10 DIAGNOSIS — Z794 Long term (current) use of insulin: Secondary | ICD-10-CM

## 2022-06-10 DIAGNOSIS — T8140XA Infection following a procedure, unspecified, initial encounter: Secondary | ICD-10-CM

## 2022-06-10 DIAGNOSIS — K219 Gastro-esophageal reflux disease without esophagitis: Secondary | ICD-10-CM | POA: Diagnosis present

## 2022-06-10 DIAGNOSIS — K760 Fatty (change of) liver, not elsewhere classified: Secondary | ICD-10-CM | POA: Diagnosis present

## 2022-06-10 DIAGNOSIS — Z881 Allergy status to other antibiotic agents status: Secondary | ICD-10-CM | POA: Diagnosis not present

## 2022-06-10 DIAGNOSIS — Z882 Allergy status to sulfonamides status: Secondary | ICD-10-CM

## 2022-06-10 DIAGNOSIS — Z79899 Other long term (current) drug therapy: Secondary | ICD-10-CM

## 2022-06-10 DIAGNOSIS — Z98891 History of uterine scar from previous surgery: Secondary | ICD-10-CM

## 2022-06-10 DIAGNOSIS — I739 Peripheral vascular disease, unspecified: Principal | ICD-10-CM | POA: Diagnosis present

## 2022-06-10 DIAGNOSIS — Z7982 Long term (current) use of aspirin: Secondary | ICD-10-CM

## 2022-06-10 DIAGNOSIS — T8141XA Infection following a procedure, superficial incisional surgical site, initial encounter: Secondary | ICD-10-CM | POA: Diagnosis not present

## 2022-06-10 DIAGNOSIS — E1151 Type 2 diabetes mellitus with diabetic peripheral angiopathy without gangrene: Secondary | ICD-10-CM | POA: Diagnosis present

## 2022-06-10 DIAGNOSIS — T8149XA Infection following a procedure, other surgical site, initial encounter: Secondary | ICD-10-CM

## 2022-06-10 DIAGNOSIS — K7581 Nonalcoholic steatohepatitis (NASH): Secondary | ICD-10-CM | POA: Diagnosis present

## 2022-06-10 DIAGNOSIS — Y838 Other surgical procedures as the cause of abnormal reaction of the patient, or of later complication, without mention of misadventure at the time of the procedure: Secondary | ICD-10-CM | POA: Diagnosis present

## 2022-06-10 DIAGNOSIS — F17210 Nicotine dependence, cigarettes, uncomplicated: Secondary | ICD-10-CM

## 2022-06-10 DIAGNOSIS — F419 Anxiety disorder, unspecified: Secondary | ICD-10-CM | POA: Diagnosis present

## 2022-06-10 DIAGNOSIS — Z7901 Long term (current) use of anticoagulants: Secondary | ICD-10-CM | POA: Diagnosis not present

## 2022-06-10 DIAGNOSIS — F32A Depression, unspecified: Secondary | ICD-10-CM | POA: Diagnosis present

## 2022-06-10 DIAGNOSIS — Z86718 Personal history of other venous thrombosis and embolism: Secondary | ICD-10-CM | POA: Diagnosis not present

## 2022-06-10 DIAGNOSIS — Z6834 Body mass index (BMI) 34.0-34.9, adult: Secondary | ICD-10-CM | POA: Diagnosis not present

## 2022-06-10 HISTORY — PX: APPLICATION OF WOUND VAC: SHX5189

## 2022-06-10 HISTORY — PX: WOUND EXPLORATION: SHX6188

## 2022-06-10 HISTORY — DX: Bipolar disorder, unspecified: F31.9

## 2022-06-10 LAB — POCT I-STAT, CHEM 8
BUN: 13 mg/dL (ref 6–20)
Calcium, Ion: 1.13 mmol/L — ABNORMAL LOW (ref 1.15–1.40)
Chloride: 100 mmol/L (ref 98–111)
Creatinine, Ser: 0.4 mg/dL — ABNORMAL LOW (ref 0.44–1.00)
Glucose, Bld: 315 mg/dL — ABNORMAL HIGH (ref 70–99)
HCT: 41 % (ref 36.0–46.0)
Hemoglobin: 13.9 g/dL (ref 12.0–15.0)
Potassium: 4.2 mmol/L (ref 3.5–5.1)
Sodium: 134 mmol/L — ABNORMAL LOW (ref 135–145)
TCO2: 24 mmol/L (ref 22–32)

## 2022-06-10 LAB — COMPREHENSIVE METABOLIC PANEL
ALT: 11 U/L (ref 0–44)
AST: 11 U/L — ABNORMAL LOW (ref 15–41)
Albumin: 2.9 g/dL — ABNORMAL LOW (ref 3.5–5.0)
Alkaline Phosphatase: 50 U/L (ref 38–126)
Anion gap: 8 (ref 5–15)
BUN: 14 mg/dL (ref 6–20)
CO2: 23 mmol/L (ref 22–32)
Calcium: 8.6 mg/dL — ABNORMAL LOW (ref 8.9–10.3)
Chloride: 102 mmol/L (ref 98–111)
Creatinine, Ser: 0.55 mg/dL (ref 0.44–1.00)
GFR, Estimated: >60 mL/min (ref >60)
Glucose, Bld: 173 mg/dL — ABNORMAL HIGH (ref 70–99)
Potassium: 4.2 mmol/L (ref 3.5–5.1)
Sodium: 133 mmol/L — ABNORMAL LOW (ref 135–145)
Total Bilirubin: 0.4 mg/dL (ref 0.3–1.2)
Total Protein: 6.4 g/dL — ABNORMAL LOW (ref 6.5–8.1)

## 2022-06-10 LAB — CBC
HCT: 35.9 % — ABNORMAL LOW (ref 36.0–46.0)
Hemoglobin: 11.9 g/dL — ABNORMAL LOW (ref 12.0–15.0)
MCH: 28.5 pg (ref 26.0–34.0)
MCHC: 33.1 g/dL (ref 30.0–36.0)
MCV: 85.9 fL (ref 80.0–100.0)
Platelets: 259 10*3/uL (ref 150–400)
RBC: 4.18 MIL/uL (ref 3.87–5.11)
RDW: 13.4 % (ref 11.5–15.5)
WBC: 12.9 10*3/uL — ABNORMAL HIGH (ref 4.0–10.5)
nRBC: 0 % (ref 0.0–0.2)

## 2022-06-10 LAB — GLUCOSE, CAPILLARY
Glucose-Capillary: 338 mg/dL — ABNORMAL HIGH (ref 70–99)
Glucose-Capillary: 234 mg/dL — ABNORMAL HIGH (ref 70–99)
Glucose-Capillary: 185 mg/dL — ABNORMAL HIGH (ref 70–99)
Glucose-Capillary: 245 mg/dL — ABNORMAL HIGH (ref 70–99)
Glucose-Capillary: 213 mg/dL — ABNORMAL HIGH (ref 70–99)
Glucose-Capillary: 151 mg/dL — ABNORMAL HIGH (ref 70–99)

## 2022-06-10 LAB — PROTIME-INR
INR: 1.1 (ref 0.8–1.2)
Prothrombin Time: 14 seconds (ref 11.4–15.2)

## 2022-06-10 LAB — HIV ANTIBODY (ROUTINE TESTING W REFLEX): HIV Screen 4th Generation wRfx: NONREACTIVE

## 2022-06-10 LAB — POCT PREGNANCY, URINE: Preg Test, Ur: NEGATIVE

## 2022-06-10 SURGERY — WOUND EXPLORATION
Anesthesia: General | Site: Groin | Laterality: Right

## 2022-06-10 MED ORDER — ACETAMINOPHEN 500 MG PO TABS
1000.0000 mg | ORAL_TABLET | Freq: Once | ORAL | Status: AC
  Administered 2022-06-10: 1000 mg via ORAL

## 2022-06-10 MED ORDER — CHLORHEXIDINE GLUCONATE 0.12 % MT SOLN
OROMUCOSAL | Status: AC
  Administered 2022-06-10: 15 mL via OROMUCOSAL
  Filled 2022-06-10: qty 15

## 2022-06-10 MED ORDER — PROMETHAZINE HCL 25 MG/ML IJ SOLN: 6.2500 mg | INTRAMUSCULAR | Status: DC | PRN

## 2022-06-10 MED ORDER — HEPARIN 6000 UNIT IRRIGATION SOLUTION
Status: AC
  Filled 2022-06-10: qty 500

## 2022-06-10 MED ORDER — LABETALOL HCL 5 MG/ML IV SOLN: 10.0000 mg | INTRAVENOUS | Status: DC | PRN

## 2022-06-10 MED ORDER — POLYETHYLENE GLYCOL 3350 17 G PO PACK
17.0000 g | PACK | Freq: Every day | ORAL | Status: DC | PRN
  Administered 2022-06-13 – 2022-06-14 (×2): 17 g via ORAL
  Filled 2022-06-10 (×2): qty 1

## 2022-06-10 MED ORDER — ROCURONIUM BROMIDE 10 MG/ML (PF) SYRINGE
PREFILLED_SYRINGE | INTRAVENOUS | Status: AC
  Filled 2022-06-10: qty 10

## 2022-06-10 MED ORDER — ORAL CARE MOUTH RINSE: 15.0000 mL | Freq: Once | OROMUCOSAL | Status: AC

## 2022-06-10 MED ORDER — PIPERACILLIN-TAZOBACTAM 3.375 G IVPB
3.3750 g | Freq: Three times a day (TID) | INTRAVENOUS | Status: DC
  Administered 2022-06-10 – 2022-06-14 (×12): 3.375 g via INTRAVENOUS
  Filled 2022-06-10 (×11): qty 50

## 2022-06-10 MED ORDER — PHENYLEPHRINE 80 MCG/ML (10ML) SYRINGE FOR IV PUSH (FOR BLOOD PRESSURE SUPPORT)
PREFILLED_SYRINGE | INTRAVENOUS | Status: DC | PRN
  Administered 2022-06-10 (×2): 160 ug via INTRAVENOUS

## 2022-06-10 MED ORDER — HYDROMORPHONE HCL 1 MG/ML IJ SOLN
0.2500 mg | INTRAMUSCULAR | Status: DC | PRN
  Administered 2022-06-10 (×3): 0.25 mg via INTRAVENOUS

## 2022-06-10 MED ORDER — MIDAZOLAM HCL 2 MG/2ML IJ SOLN
INTRAMUSCULAR | Status: DC | PRN
  Administered 2022-06-10: 2 mg via INTRAVENOUS

## 2022-06-10 MED ORDER — GUAIFENESIN-DM 100-10 MG/5ML PO SYRP: 15.0000 mL | ORAL_SOLUTION | ORAL | Status: DC | PRN

## 2022-06-10 MED ORDER — OXYCODONE HCL 5 MG PO TABS
5.0000 mg | ORAL_TABLET | ORAL | Status: DC | PRN
  Administered 2022-06-10 – 2022-06-14 (×11): 10 mg via ORAL
  Filled 2022-06-10 (×12): qty 2

## 2022-06-10 MED ORDER — FENTANYL CITRATE (PF) 250 MCG/5ML IJ SOLN
INTRAMUSCULAR | Status: AC
  Filled 2022-06-10: qty 5

## 2022-06-10 MED ORDER — MIDAZOLAM HCL 2 MG/2ML IJ SOLN
INTRAMUSCULAR | Status: AC
  Filled 2022-06-10: qty 2

## 2022-06-10 MED ORDER — MIDAZOLAM HCL 2 MG/2ML IJ SOLN: 0.5000 mg | Freq: Once | INTRAMUSCULAR | Status: DC | PRN

## 2022-06-10 MED ORDER — RIVAROXABAN 20 MG PO TABS
20.0000 mg | ORAL_TABLET | Freq: Every day | ORAL | Status: DC
  Filled 2022-06-10: qty 1

## 2022-06-10 MED ORDER — PANTOPRAZOLE SODIUM 40 MG PO TBEC
40.0000 mg | DELAYED_RELEASE_TABLET | Freq: Every day | ORAL | Status: DC
  Administered 2022-06-10 – 2022-06-15 (×6): 40 mg via ORAL
  Filled 2022-06-10 (×6): qty 1

## 2022-06-10 MED ORDER — LIDOCAINE 2% (20 MG/ML) 5 ML SYRINGE
INTRAMUSCULAR | Status: DC | PRN
  Administered 2022-06-10: 20 mg via INTRAVENOUS

## 2022-06-10 MED ORDER — ONDANSETRON HCL 4 MG/2ML IJ SOLN
INTRAMUSCULAR | Status: AC
  Filled 2022-06-10: qty 2

## 2022-06-10 MED ORDER — INSULIN ASPART 100 UNIT/ML IJ SOLN
0.0000 [IU] | Freq: Three times a day (TID) | INTRAMUSCULAR | Status: DC
  Administered 2022-06-10 – 2022-06-11 (×3): 5 [IU] via SUBCUTANEOUS
  Administered 2022-06-11: 2 [IU] via SUBCUTANEOUS
  Administered 2022-06-11: 3 [IU] via SUBCUTANEOUS
  Administered 2022-06-12: 2 [IU] via SUBCUTANEOUS
  Administered 2022-06-12: 3 [IU] via SUBCUTANEOUS
  Administered 2022-06-12: 2 [IU] via SUBCUTANEOUS
  Administered 2022-06-13 (×3): 3 [IU] via SUBCUTANEOUS
  Administered 2022-06-14 (×2): 2 [IU] via SUBCUTANEOUS
  Administered 2022-06-14: 5 [IU] via SUBCUTANEOUS
  Administered 2022-06-15: 3 [IU] via SUBCUTANEOUS

## 2022-06-10 MED ORDER — HYDROMORPHONE HCL 1 MG/ML IJ SOLN
INTRAMUSCULAR | Status: AC
  Administered 2022-06-10: 0.25 mg via INTRAVENOUS
  Filled 2022-06-10: qty 1

## 2022-06-10 MED ORDER — CHLORHEXIDINE GLUCONATE 4 % EX SOLN: 60.0000 mL | Freq: Once | CUTANEOUS | Status: DC

## 2022-06-10 MED ORDER — PROPOFOL 10 MG/ML IV BOLUS
INTRAVENOUS | Status: DC | PRN
  Administered 2022-06-10: 120 mg via INTRAVENOUS

## 2022-06-10 MED ORDER — ACETAMINOPHEN 500 MG PO TABS
ORAL_TABLET | ORAL | Status: AC
  Filled 2022-06-10: qty 2

## 2022-06-10 MED ORDER — PHENYLEPHRINE HCL-NACL 20-0.9 MG/250ML-% IV SOLN
INTRAVENOUS | Status: DC | PRN
  Administered 2022-06-10: 25 ug/min via INTRAVENOUS

## 2022-06-10 MED ORDER — ACETAMINOPHEN 325 MG PO TABS
325.0000 mg | ORAL_TABLET | ORAL | Status: DC | PRN
  Administered 2022-06-11 – 2022-06-12 (×2): 650 mg via ORAL
  Filled 2022-06-10 (×2): qty 2

## 2022-06-10 MED ORDER — GENTAMICIN SULFATE 40 MG/ML IJ SOLN
INTRAMUSCULAR | Status: DC | PRN
  Administered 2022-06-10: 240 mg via INTRAMUSCULAR

## 2022-06-10 MED ORDER — CEFAZOLIN SODIUM-DEXTROSE 2-4 GM/100ML-% IV SOLN
INTRAVENOUS | Status: AC
  Filled 2022-06-10: qty 100

## 2022-06-10 MED ORDER — METFORMIN HCL ER 500 MG PO TB24
1000.0000 mg | ORAL_TABLET | Freq: Two times a day (BID) | ORAL | Status: DC
  Administered 2022-06-10 – 2022-06-15 (×10): 1000 mg via ORAL
  Filled 2022-06-10 (×12): qty 2

## 2022-06-10 MED ORDER — VANCOMYCIN HCL 1000 MG IV SOLR
INTRAVENOUS | Status: DC | PRN
  Administered 2022-06-10: 1000 mg

## 2022-06-10 MED ORDER — VANCOMYCIN HCL 1250 MG/250ML IV SOLN
1250.0000 mg | INTRAVENOUS | Status: DC
  Administered 2022-06-11 – 2022-06-15 (×5): 1250 mg via INTRAVENOUS
  Filled 2022-06-10 (×5): qty 250

## 2022-06-10 MED ORDER — FENTANYL CITRATE (PF) 250 MCG/5ML IJ SOLN
INTRAMUSCULAR | Status: DC | PRN
  Administered 2022-06-10: 100 ug via INTRAVENOUS
  Administered 2022-06-10: 50 ug via INTRAVENOUS

## 2022-06-10 MED ORDER — SUGAMMADEX SODIUM 200 MG/2ML IV SOLN
INTRAVENOUS | Status: DC | PRN
  Administered 2022-06-10: 200 mg via INTRAVENOUS

## 2022-06-10 MED ORDER — LISINOPRIL 20 MG PO TABS
40.0000 mg | ORAL_TABLET | Freq: Every day | ORAL | Status: DC
  Administered 2022-06-11 – 2022-06-15 (×5): 40 mg via ORAL
  Filled 2022-06-10 (×5): qty 2

## 2022-06-10 MED ORDER — SODIUM CHLORIDE 0.9 % IV SOLN: INTRAVENOUS | Status: DC

## 2022-06-10 MED ORDER — LIDOCAINE 2% (20 MG/ML) 5 ML SYRINGE
INTRAMUSCULAR | Status: AC
  Filled 2022-06-10: qty 5

## 2022-06-10 MED ORDER — OXYCODONE HCL 5 MG PO TABS: 5.0000 mg | ORAL_TABLET | Freq: Once | ORAL | Status: DC | PRN

## 2022-06-10 MED ORDER — PHENOL 1.4 % MT LIQD: 1.0000 | OROMUCOSAL | Status: DC | PRN

## 2022-06-10 MED ORDER — INSULIN ASPART 100 UNIT/ML IJ SOLN
0.0000 [IU] | INTRAMUSCULAR | Status: AC | PRN
  Administered 2022-06-10: 5 [IU] via SUBCUTANEOUS

## 2022-06-10 MED ORDER — LACTATED RINGERS IV SOLN: INTRAVENOUS | Status: DC

## 2022-06-10 MED ORDER — METOPROLOL TARTRATE 5 MG/5ML IV SOLN: 2.0000 mg | INTRAVENOUS | Status: DC | PRN

## 2022-06-10 MED ORDER — CEFAZOLIN SODIUM-DEXTROSE 2-4 GM/100ML-% IV SOLN
2.0000 g | INTRAVENOUS | Status: AC
  Administered 2022-06-10: 2 g via INTRAVENOUS

## 2022-06-10 MED ORDER — INSULIN GLARGINE-YFGN 100 UNIT/ML ~~LOC~~ SOLN
16.0000 [IU] | Freq: Every day | SUBCUTANEOUS | Status: DC
  Administered 2022-06-10 – 2022-06-15 (×6): 16 [IU] via SUBCUTANEOUS
  Filled 2022-06-10 (×6): qty 0.16

## 2022-06-10 MED ORDER — ONDANSETRON HCL 4 MG/2ML IJ SOLN
INTRAMUSCULAR | Status: DC | PRN
  Administered 2022-06-10: 4 mg via INTRAVENOUS

## 2022-06-10 MED ORDER — ALUM & MAG HYDROXIDE-SIMETH 200-200-20 MG/5ML PO SUSP: 15.0000 mL | ORAL | Status: DC | PRN

## 2022-06-10 MED ORDER — MEPERIDINE HCL 25 MG/ML IJ SOLN: 6.2500 mg | INTRAMUSCULAR | Status: DC | PRN

## 2022-06-10 MED ORDER — GABAPENTIN 300 MG PO CAPS
300.0000 mg | ORAL_CAPSULE | Freq: Three times a day (TID) | ORAL | Status: DC
  Administered 2022-06-10 – 2022-06-15 (×15): 300 mg via ORAL
  Filled 2022-06-10 (×15): qty 1

## 2022-06-10 MED ORDER — PROPOFOL 10 MG/ML IV BOLUS
INTRAVENOUS | Status: AC
  Filled 2022-06-10: qty 20

## 2022-06-10 MED ORDER — ROCURONIUM BROMIDE 10 MG/ML (PF) SYRINGE
PREFILLED_SYRINGE | INTRAVENOUS | Status: DC | PRN
  Administered 2022-06-10: 50 mg via INTRAVENOUS

## 2022-06-10 MED ORDER — GENTAMICIN SULFATE 40 MG/ML IJ SOLN
INTRAMUSCULAR | Status: AC
  Filled 2022-06-10: qty 6

## 2022-06-10 MED ORDER — CHLORHEXIDINE GLUCONATE 0.12 % MT SOLN: 15.0000 mL | Freq: Once | OROMUCOSAL | Status: AC

## 2022-06-10 MED ORDER — OXYCODONE HCL 5 MG/5ML PO SOLN: 5.0000 mg | Freq: Once | ORAL | Status: DC | PRN

## 2022-06-10 MED ORDER — ASPIRIN 81 MG PO TBEC
81.0000 mg | DELAYED_RELEASE_TABLET | Freq: Every day | ORAL | Status: DC
  Administered 2022-06-11 – 2022-06-15 (×5): 81 mg via ORAL
  Filled 2022-06-10 (×5): qty 1

## 2022-06-10 MED ORDER — VANCOMYCIN HCL 1000 MG IV SOLR
INTRAVENOUS | Status: AC
  Filled 2022-06-10: qty 20

## 2022-06-10 MED ORDER — POTASSIUM CHLORIDE CRYS ER 20 MEQ PO TBCR
20.0000 meq | EXTENDED_RELEASE_TABLET | Freq: Once | ORAL | Status: DC
  Filled 2022-06-10: qty 2

## 2022-06-10 MED ORDER — SODIUM CHLORIDE 0.9 % IV SOLN: INTRAVENOUS | Status: DC | PRN

## 2022-06-10 MED ORDER — INSULIN ASPART 100 UNIT/ML IJ SOLN
INTRAMUSCULAR | Status: AC
  Administered 2022-06-10: 10 [IU] via SUBCUTANEOUS
  Filled 2022-06-10: qty 1

## 2022-06-10 MED ORDER — ROSUVASTATIN CALCIUM 20 MG PO TABS
20.0000 mg | ORAL_TABLET | Freq: Every day | ORAL | Status: DC
  Administered 2022-06-10 – 2022-06-15 (×6): 20 mg via ORAL
  Filled 2022-06-10 (×6): qty 1

## 2022-06-10 MED ORDER — ACETAMINOPHEN 650 MG RE SUPP: 325.0000 mg | RECTAL | Status: DC | PRN

## 2022-06-10 MED ORDER — ONDANSETRON HCL 4 MG/2ML IJ SOLN: 4.0000 mg | Freq: Four times a day (QID) | INTRAMUSCULAR | Status: DC | PRN

## 2022-06-10 MED ORDER — HYDRALAZINE HCL 20 MG/ML IJ SOLN: 5.0000 mg | INTRAMUSCULAR | Status: DC | PRN

## 2022-06-10 MED ORDER — HYDROMORPHONE HCL 1 MG/ML IJ SOLN
0.5000 mg | INTRAMUSCULAR | Status: DC | PRN
  Administered 2022-06-10 – 2022-06-15 (×4): 0.5 mg via INTRAVENOUS
  Filled 2022-06-10 (×6): qty 0.5

## 2022-06-10 MED ORDER — VANCOMYCIN HCL 2000 MG/400ML IV SOLN
2000.0000 mg | Freq: Once | INTRAVENOUS | Status: AC
  Administered 2022-06-10: 2000 mg via INTRAVENOUS
  Filled 2022-06-10: qty 400

## 2022-06-10 SURGICAL SUPPLY — 26 items
BAG COUNTER SPONGE SURGICOUNT (BAG) ×3 IMPLANT
BAG SPNG CNTER NS LX DISP (BAG) ×2
CANISTER SUCT 3000ML PPV (MISCELLANEOUS) ×3 IMPLANT
COVER SURGICAL LIGHT HANDLE (MISCELLANEOUS) ×3 IMPLANT
DRESSING VERAFLO CLEANS CC MED (GAUZE/BANDAGES/DRESSINGS) IMPLANT
DRSG VERAFLO CLEANSE CC MED (GAUZE/BANDAGES/DRESSINGS) ×2
ELECT REM PT RETURN 9FT ADLT (ELECTROSURGICAL) ×2
ELECTRODE REM PT RTRN 9FT ADLT (ELECTROSURGICAL) ×3 IMPLANT
GLOVE BIO SURGEON STRL SZ7.5 (GLOVE) ×3 IMPLANT
GLOVE BIOGEL PI IND STRL 8 (GLOVE) ×3 IMPLANT
GOWN STRL REUS W/ TWL LRG LVL3 (GOWN DISPOSABLE) ×9 IMPLANT
GOWN STRL REUS W/ TWL XL LVL3 (GOWN DISPOSABLE) ×6 IMPLANT
GOWN STRL REUS W/TWL LRG LVL3 (GOWN DISPOSABLE) ×6
GOWN STRL REUS W/TWL XL LVL3 (GOWN DISPOSABLE) ×4
KIT BASIN OR (CUSTOM PROCEDURE TRAY) ×3 IMPLANT
KIT STIMULAN RAPID CURE 5CC (Orthopedic Implant) IMPLANT
KIT TURNOVER KIT B (KITS) ×3 IMPLANT
NS IRRIG 1000ML POUR BTL (IV SOLUTION) ×6 IMPLANT
PACK GENERAL/GYN (CUSTOM PROCEDURE TRAY) ×3 IMPLANT
PAD ARMBOARD 7.5X6 YLW CONV (MISCELLANEOUS) ×6 IMPLANT
PAD NEG PRESSURE SENSATRAC (MISCELLANEOUS) IMPLANT
POWDER MORCELSS FINE 2000MG (Miscellaneous) IMPLANT
POWDER MYRIAD MORCELLS 1000MG (Miscellaneous) IMPLANT
PWDR MORCELSS FINE 2000MG (Miscellaneous) ×2 IMPLANT
TOWEL GREEN STERILE (TOWEL DISPOSABLE) ×3 IMPLANT
WATER STERILE IRR 1000ML POUR (IV SOLUTION) ×6 IMPLANT

## 2022-06-10 NOTE — Inpatient Diabetes Management (Signed)
Inpatient Diabetes Program Recommendations  AACE/ADA: New Consensus Statement on Inpatient Glycemic Control (2015)  Target Ranges:  Prepandial:   less than 140 mg/dL      Peak postprandial:   less than 180 mg/dL (1-2 hours)      Critically ill patients:  140 - 180 mg/dL   Lab Results  Component Value Date   GLUCAP 234 (H) 06/10/2022   HGBA1C 9.0 (H) 05/08/2022    Review of Glycemic Control  Latest Reference Range & Units 06/10/22 05:52 06/10/22 08:48  Glucose-Capillary 70 - 99 mg/dL 161 (H) 096 (H)  (H): Data is abnormally high Diabetes history: Type 2 DM Outpatient Diabetes medications: Metformin 500 mg BID, Semglee 28 units QHS Current orders for Inpatient glycemic control: Novolog 0-15 units TID  Inpatient Diabetes Program Recommendations:    Consider adding Semglee 16 units QD.   Thanks, Lujean Rave, MSN, RNC-OB Diabetes Coordinator [phone] (8a-5p)

## 2022-06-10 NOTE — Op Note (Signed)
NAME: KAJUANA MARSAN    MRN: [id number] DOB: August 23, 1980    DATE OF OPERATION: 06/10/2022  PREOP DIAGNOSIS:    Right groin postop wound infection  POSTOP DIAGNOSIS:    Same  PROCEDURE:    Right groin washout, soft tissue debridement, antibiotic bead placement, myriad more cell powder 4 g placement, vacuum-assisted closure Wound size 12 cm x 4 cm x 4 cm  SURGEON: [name]  ASSIST: Cherly Hensen, [place]  ANESTHESIA: General  EBL: 10 mL  INDICATIONS:    [name] [name] is a 42 y.o. female with history of right lower extremity iliofemoral, femoral-popliteal embolectomy for acute limb ischemia.  On postop day 5, she had a bleeding event requiring washout of the groin.  No bleeding vessel was appreciated.  She has been followed in the outpatient setting, but recently came in with purulence.  I discussed the risk and benefits of wound washout, Audria elected to proceed. We discussed the importance of blood glucose control, and smoking cessation as her blood sugars range in the 300s, and she continues to smoke on a daily basis.  FINDINGS:   Wound size 12 cm x 4 cm x 4 cm No vasculature exposed  TECHNIQUE:   Patient brought to the operating room and laid in supine position.  General anesthesia was induced and patient was prepped draped in standard fashion.  The case began with removal of the staples in the right groin.  Purulence was appreciated, and cultures were taken.  Next, the wound was carried down to the base and purulence washed out.  Overall, the wound base looked healthy, however there was a small amount of nonviable tissue that was debrided.  The wound bed was irrigated with copious amounts of normal saline, and this was followed by antibiotic irrigation.  Antibiotic beads were placed in the wound bed, followed by 4 g of myriad more cell powder.  There is a small amount of tunneling both cephalad and caudad.  I paid special attention to ensure these areas were washed  out appropriately and packed with the powder and beads.  I elected to use vacuum-assisted closure to allow for healing by secondary intention.    Ladonna Snide, MD [facility] DATE OF DICTATION:   06/10/2022

## 2022-06-10 NOTE — OR Nursing (Signed)
1 blue Veraflo sponge was put inside incision and attached  to vwound vac

## 2022-06-10 NOTE — H&P (Signed)
History and Physical Interval Note:  06/10/2022 7:18 AM  [name] [name]  has presented today for surgery, with the diagnosis of Infection.  The various methods of treatment have been discussed with the patient and family. After consideration of risks, benefits and other options for treatment, the patient has consented to  Procedure(s): EXPLORATION OF RIGHT GROIN (Right) as a surgical intervention.  The patient's history has been reviewed, patient examined, no change in status, stable for surgery.  I have reviewed the patient's chart and labs.  Questions were answered to the patient's satisfaction.    Washout right groin with I&D for infection.  Cephus Shelling   POST OPERATIVE OFFICE NOTE       CC:  F/u for surgery   HPI:  [name] who is s/p right femoral-popliteal embolectomy, 4 compartment fasciotomy and prevena placement on 05/07/2022 by Dr. Karin Lieu.  She was discharged home on DOAC.  She returned to hospital on 05/13/2022 and was taken to the operating room and underwent evacuation of hematoma, placement of Kerecis, and prevena wound vac by Dr. Myra Gianotti.  She was seen back on 05/29/2022 by Dr. Karin Lieu and at that time, she was not having any claudication or rest pain.  She did have some paresthesias around the fasciotomy sites. She had a palpable pulse in the right foot.  Her workup was negative for atheroembolic source and she was referred to cardiology for holter monitor.     Pt returns today for follow up. She called triage this morning with c/o fevers and yellowish drainage and pain radiating upward into the pt's abdomen.  She was scheduled as a work in for evaluation.     Pt states her incision started draining a couple of days ago.  She states that it is not clear fluid and it looks thick.  She had a fever yesterday of 101.4 and took 4 tylenol to break her fever.  She is currently not on abx.  She states she has been working hard on keeping her groin dry with gauze. She  is not having any pain in the right foot.  She has been compliant with her Xarelto.  She has not been called by the cardiology office yet.           Allergies  Allergen Reactions   Sulfa Antibiotics Hives            Current Outpatient Medications  Medication Sig Dispense Refill   aspirin EC 81 MG tablet Take 1 tablet (81 mg total) by mouth daily at 6 (six) AM. Swallow whole. (Patient taking differently: Take 81 mg by mouth daily at 6 (six) AM.) 30 tablet 12   Blood Glucose Monitoring Suppl (ACCU-CHEK GUIDE) w/Device KIT 1 each by Other route 4 (four) times daily -  before meals and at bedtime. 1 kit 0   gabapentin (NEURONTIN) 300 MG capsule TAKE 1 CAPSULE BY MOUTH THREE TIMES DAILY 270 capsule 0   glucose blood (ACCU-CHEK GUIDE) test strip Use as instructed 100 each 12   insulin glargine-yfgn (SEMGLEE) 100 UNIT/ML Pen Inject 28 Units into the skin at bedtime. 15 mL 2   Insulin Pen Needle (PEN NEEDLES 3/16") 31G X 5 MM MISC 1 each by Does not apply route daily. Use to inject insulin 100 each 0   Lancets (ACCU-CHEK MULTICLIX) lancets Use as instructed 100 each 12   lisinopril (ZESTRIL) 40 MG tablet Take 1 tablet by mouth once daily 90 tablet 0  metFORMIN (GLUCOPHAGE-XR) 500 MG 24 hr tablet TAKE 2 TABLETS BY MOUTH TWICE DAILY WITH A MEAL (Patient taking differently: Take 1,000 mg by mouth 2 (two) times daily with a meal.) 120 tablet 2   oxyCODONE-acetaminophen (PERCOCET/ROXICET) 5-325 MG tablet Take 1-2 tablets by mouth every 4 (four) hours as needed for moderate pain. 30 tablet 0   rivaroxaban (XARELTO) 20 MG TABS tablet Take 1 tablet (20 mg total) by mouth daily. (Patient taking differently: Take 20 mg by mouth daily with supper.) 30 tablet 3   rosuvastatin (CRESTOR) 20 MG tablet Take 1 tablet (20 mg total) by mouth daily. (Patient taking differently: Take 20 mg by mouth every evening.) 30 tablet 11    No current facility-administered medications for this visit.       ROS:  See HPI    Physical Exam:      Today's Vitals    06/09/22 1015  BP: 132/84  Pulse: (!) 110  Resp: 18  Temp: 98 F (36.7 C)  TempSrc: Temporal  SpO2: 94%  Weight: 180 lb (81.6 kg)  Height: 5\' 1"  (1.549 m)    Body mass index is 34.01 kg/m.     Incision:  right groin with staples in tact with purulent drainage from incision.  Fasciotomy sites healed nicely.  Extremities:  palpable right DP pulse.         Assessment/Plan:  [name] who is s/p: right femoral-popliteal embolectomy, 4 compartment fasciotomy and prevena placement on 05/07/2022 by Dr. Karin Lieu.  She was discharged home on DOAC.  She returned to hospital on 05/13/2022 and was taken to the operating room and underwent evacuation of hematoma, placement of Kerecis, and prevena wound vac by Dr. Myra Gianotti.       -pt with palpable right DP pulse.  -pt with fever of 101.4 yesterday and purulent drainage from incision for a couple of days.   -pt seen with Dr. Chestine Spore.  Right groin incision grossly infected with purulent drainage. Will plan for groin washout tomorrow with Dr. Chestine Spore.  Pt will hold Xarelto for today and tomorrow.  She will need to be admitted.    -augmentin sent to [facility] on [address].  -pt given gauze for drainage from groin     Doreatha Massed, [facility] [phone]     Clinic MD:  Chestine Spore

## 2022-06-10 NOTE — Transfer of Care (Signed)
Immediate Anesthesia Transfer of Care Note  Patient: [name] [name]  Procedure(s) Performed: Reola Calkins AND DEBRIDMENT OF RIGHT GROIN (Right) APPLICATION OF WOUND VAC (Right: Groin)  Patient Location: PACU  Anesthesia Type:General  Level of Consciousness: drowsy and patient cooperative  Airway & Oxygen Therapy: Patient Spontanous Breathing and Patient connected to nasal cannula oxygen  Post-op Assessment: Report given to RN and Post -op Vital signs reviewed and stable  Post vital signs: Reviewed and stable  Last Vitals:  Vitals Value Taken Time  BP 103/65 06/10/22 0845  Temp    Pulse 84 06/10/22 0848  Resp 25 06/10/22 0848  SpO2 95 % 06/10/22 0848  Vitals shown include unvalidated device data.  Last Pain:  Vitals:   06/10/22 0608  TempSrc:   PainSc: 9      Facial droop noted on left side -- baseline from preoperative area -- patient drowsy, but purposefully responds to stimuli.    Complications: No notable events documented.

## 2022-06-10 NOTE — Anesthesia Procedure Notes (Signed)
Procedure Name: Intubation Date/Time: 06/10/2022 7:51 AM  Performed by: Gus Puma, CRNAPre-anesthesia Checklist: Patient identified, Emergency Drugs available, Suction available and Patient being monitored Patient Re-evaluated:Patient Re-evaluated prior to induction Oxygen Delivery Method: Circle System Utilized Preoxygenation: Pre-oxygenation with 100% oxygen Induction Type: IV induction Ventilation: Mask ventilation without difficulty Laryngoscope Size: Mac and 3 Grade View: Grade I Tube type: Oral Tube size: 7.0 mm Number of attempts: 1 Airway Equipment and Method: Stylet Placement Confirmation: ETT inserted through vocal cords under direct vision, positive ETCO2 and breath sounds checked- equal and bilateral Secured at: 21 cm Tube secured with: Tape Dental Injury: Teeth and Oropharynx as per pre-operative assessment

## 2022-06-10 NOTE — Anesthesia Postprocedure Evaluation (Signed)
Anesthesia Post Note  Patient: [name] [name]  Procedure(s) Performed: Reola Calkins AND DEBRIDMENT OF RIGHT GROIN (Right) APPLICATION OF WOUND VAC (Right: Groin)     Patient location during evaluation: PACU Anesthesia Type: General Level of consciousness: awake and alert, patient cooperative and oriented Pain management: pain level controlled Vital Signs Assessment: post-procedure vital signs reviewed and stable Respiratory status: spontaneous breathing, nonlabored ventilation and respiratory function stable Cardiovascular status: blood pressure returned to baseline and stable Postop Assessment: no apparent nausea or vomiting and adequate PO intake Anesthetic complications: no   No notable events documented.  Last Vitals:  Vitals:   06/10/22 1005 06/10/22 1028  BP:  (!) 90/52  Pulse: 67 67  Resp: 18   Temp: (!) 36.1 C (!) 36.4 C  SpO2: 90% 98%    Last Pain:  Vitals:   06/10/22 1028  TempSrc: Oral  PainSc:                  Bera Pinela,E. Idell Hissong

## 2022-06-11 ENCOUNTER — Encounter (HOSPITAL_COMMUNITY): Payer: Self-pay | Admitting: Vascular Surgery

## 2022-06-11 LAB — GLUCOSE, CAPILLARY
Glucose-Capillary: 141 mg/dL — ABNORMAL HIGH (ref 70–99)
Glucose-Capillary: 183 mg/dL — ABNORMAL HIGH (ref 70–99)
Glucose-Capillary: 165 mg/dL — ABNORMAL HIGH (ref 70–99)

## 2022-06-11 MED ORDER — RIVAROXABAN 20 MG PO TABS
20.0000 mg | ORAL_TABLET | Freq: Every day | ORAL | Status: DC
  Administered 2022-06-11 – 2022-06-14 (×3): 20 mg via ORAL
  Filled 2022-06-11 (×5): qty 1

## 2022-06-11 NOTE — Progress Notes (Signed)
Pharmacy Antibiotic Note  [name] [name] is a 42 y.o. female admitted on 06/10/2022 with groin hematoma / groin infection> s/p debridement.  Pharmacy has been consulted for zosyn and vancomycin dosing. Cr 1 crcl 69ml/min   Plan: Zosyn 3.375gm IV q8h EI Vancomycin 2gm x1 then 1250mg  IV q24h Est AUC 505  SCr used: 0.8, Vd 0.5 L /Kg (BMI 34   Height: 5\' 1"  (154.9 cm) Weight: 81.6 kg (180 lb) IBW/kg (Calculated) : 47.8  Temp (24hrs), Avg:98.1 F (36.7 C), Min:97.5 F (36.4 C), Max:98.5 F (36.9 C)  Recent Labs  Lab 06/10/22 0622 06/10/22 1055  WBC  --  12.9*  CREATININE 0.40* 0.55    Estimated Creatinine Clearance: 86.8 mL/min (by C-G formula based on SCr of 0.55 mg/dL).    Allergies  Allergen Reactions   Sulfa Antibiotics Hives    Antimicrobials this admission: 5/29 vanc  5/29 zosyn   Dose adjustments this admission:   Microbiology results: 5/29 wound GPC      Leota Sauers Pharm.D. CPP, BCPS Clinical Pharmacist [phone] 06/11/2022 10:48 AM

## 2022-06-11 NOTE — Plan of Care (Signed)
°  Problem: Health Behavior/Discharge Planning: °Goal: Ability to manage health-related needs will improve °Outcome: Progressing °  °Problem: Skin Integrity: °Goal: Risk for impaired skin integrity will decrease °Outcome: Progressing °  °

## 2022-06-11 NOTE — TOC Initial Note (Addendum)
Transition of Care [facility]) - Initial/Assessment Note    Patient Details  Name: [name] [name] MRN: [id number] Date of Birth: [birth year]  [facility] CM/SW Contact:    Lawerance Sabal, RN Phone Number: 06/11/2022, 11:28 AM  Clinical Narrative:                  Spoke w patient at bedside yesterday, she states that she has had multiple proveena VACs for same issue but was unsure of type of VAC or potential date for DC.   Spoke w Clinton Gallant PA Vasc. Svc. Who confirmed patient would need a [facility] VAC.  Referral made to [facility] and order placed on chart for Emma to sign. NEEDS order faxed to [facility].  Patient will [facility] RN for [facility] dressing changes.  [facility]- checking to see if they are in network [facility]- checking to see if in network Reached out to [place] , office is assessing if they can cover commercial [facility] plan.   PCP [facility]  Expected Discharge Plan: Home w Home Health Services Barriers to Discharge: Continued Medical Work up   Patient Goals and CMS Choice Patient states their goals for this hospitalization and ongoing recovery are:: to go home CMS Medicare.gov Compare Post Acute Care list provided to:: Patient Choice offered to / list presented to : Patient      Expected Discharge Plan and Services   Discharge Planning Services: CM Consult Post Acute Care Choice: Home Health, Durable Medical Equipment Living arrangements for the past 2 months: Single Family Home                 DME Arranged: Vac DME Agency: [facility] Date DME Agency Contacted: 06/11/22 Time DME Agency Contacted: 1030 Representative spoke with at DME Agency: [facility] HH Arranged: RN          Prior Living Arrangements/Services Living arrangements for the past 2 months: Single Family Home Lives with:: Parents, Adult Children                   Activities of Daily Living Home Assistive Devices/Equipment: CBG Meter, Eyeglasses, Shower chair without back ADL Screening  (condition at time of admission) Patient's cognitive ability adequate to safely complete daily activities?: Yes Is the patient deaf or have difficulty hearing?: No Does the patient have difficulty seeing, even when wearing glasses/contacts?: No Does the patient have difficulty concentrating, remembering, or making decisions?: No Patient able to express need for assistance with ADLs?: Yes Does the patient have difficulty dressing or bathing?: No Independently performs ADLs?: Yes (appropriate for developmental age) Does the patient have difficulty walking or climbing stairs?: No Weakness of Legs: Right Weakness of Arms/Hands: None  Permission Sought/Granted                  Emotional Assessment              Admission diagnosis:  PAD (peripheral artery disease) (HCC) [I73.9] 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   Hypertensive retinopathy of both eyes 11/08/2018   Nuclear age-related cataract, both eyes 11/08/2018   MDD (major depressive disorder), recurrent episode, moderate (HCC) 08/04/2018   PTSD (post-traumatic stress disorder) 08/04/2018   MDD (major depressive disorder), severe (HCC) 07/12/2018   GERD (gastroesophageal reflux disease) 02/17/2018   Type 2 diabetes  mellitus without complication, without long-term current use of insulin (HCC) 02/17/2018   S/P cesarean section 06/01/2014   S/P tubal ligation 06/01/2014   H/O cesarean section complicating pregnancy 04/18/2014   Emotional disorder (HCC) 03/16/2013   Tobacco user 03/16/2013   Acute chest pain 03/01/2013   HTN (hypertension) 09/28/2012   DM (diabetes mellitus) (HCC) 11/06/2011   PCP:  [facility] Pharmacy:   [id number] [address] ([address]), [place] - [address] [place]  ([place]) [place] [address] Phone: [phone] Fax: [phone]     Social Determinants of Health (SDOH) Social History: SDOH Screenings   Food Insecurity: Patient Declined (05/14/2022)  Housing: Low Risk  (05/14/2022)  Transportation Needs: Patient Declined (05/14/2022)  Utilities: Patient Declined (05/14/2022)  Alcohol Screen: Low Risk  (07/12/2018)  Depression (PHQ2-9): Low Risk  (02/09/2022)  Tobacco Use: High Risk (06/10/2022)   SDOH Interventions:     Readmission Risk Interventions    05/09/2022    1:14 PM  Readmission Risk Prevention Plan  Post Dischage Appt Complete  Medication Screening Complete  Transportation Screening Complete

## 2022-06-11 NOTE — Progress Notes (Addendum)
[facility] [place]  Subjective  - She is doing well over all.   Objective 122/70 68 98 F (36.7 C) (Oral) 20 100%  Intake/Output Summary (Last 24 hours) at 06/11/2022 0704 Last data filed at 06/11/2022 0539 Gross per 24 hour  Intake 1384.9 ml  Output 75 ml  Net 1309.9 ml    Right groin vac to good suction Palpable DP  Ambulating in room Lungs non labored breathing General no acute distress   Assessment/Planning: [name] who is s/p: right femoral-popliteal embolectomy, 4 compartment fasciotomy and prevena placement on 05/07/2022 by Dr. Karin Lieu.  She was discharged home on DOAC.  She returned to hospital on 05/13/2022 and was taken to the operating room and underwent evacuation of hematoma, placement of Kerecis, and prevena wound vac by Dr. Myra Gianotti.  F/U visit in our office she had signs and symptoms of right groin infection.    POD # 1  Right groin washout, soft tissue debridement, antibiotic bead placement, myriad more cell powder 4 g placement, vacuum-assisted closure Wound size 12 cm x 4 cm x 4 cm Cultures pending final results: RARE GRAM POSITIVE COCCI   HGB stable will restart Xarelto today Continue vancomycin until cultures have been finalized. Heplock IV Urine OP > 1300 ml last 24 hours Vac change date per Dr. Karin Lieu.   Mosetta Pigeon 06/11/2022 7:04 AM --  VASCULAR STAFF ADDENDUM: I have independently interviewed and examined the patient. I agree with the above.  Awaiting cultures.  OOB as tolerated. Plan for d/c once sensitivities finalize.    Fara Olden, MD [facility] Phone Number: [phone] 06/11/2022 3:18 PM    Laboratory Lab Results: Recent Labs    06/10/22 0622 06/10/22 1055  WBC  --  12.9*  HGB 13.9 11.9*  HCT 41.0 35.9*  PLT  --  259   BMET Recent Labs    06/10/22 0622 06/10/22 1055  NA 134* 133*  K 4.2 4.2  CL 100 102  CO2  --  23   GLUCOSE 315* 173*  BUN 13 14  CREATININE 0.40* 0.55  CALCIUM  --  8.6*    COAG Lab Results  Component Value Date   INR 1.1 06/10/2022   INR 1.0 10/09/2021   INR 0.97 09/30/2017   No results found for: "PTT"

## 2022-06-12 ENCOUNTER — Ambulatory Visit: Payer: No Typology Code available for payment source

## 2022-06-12 ENCOUNTER — Other Ambulatory Visit: Payer: Self-pay

## 2022-06-12 LAB — CBC
HCT: 37.1 % (ref 36.0–46.0)
Hemoglobin: 12.2 g/dL (ref 12.0–15.0)
MCH: 28.2 pg (ref 26.0–34.0)
MCHC: 32.9 g/dL (ref 30.0–36.0)
MCV: 85.7 fL (ref 80.0–100.0)
Platelets: 323 10*3/uL (ref 150–400)
RBC: 4.33 MIL/uL (ref 3.87–5.11)
RDW: 13.1 % (ref 11.5–15.5)
WBC: 7.6 10*3/uL (ref 4.0–10.5)
nRBC: 0 % (ref 0.0–0.2)

## 2022-06-12 LAB — BASIC METABOLIC PANEL
Anion gap: 11 (ref 5–15)
BUN: 10 mg/dL (ref 6–20)
CO2: 23 mmol/L (ref 22–32)
Calcium: 8.9 mg/dL (ref 8.9–10.3)
Chloride: 98 mmol/L (ref 98–111)
Creatinine, Ser: 0.54 mg/dL (ref 0.44–1.00)
GFR, Estimated: >60 mL/min (ref >60)
Glucose, Bld: 152 mg/dL — ABNORMAL HIGH (ref 70–99)
Potassium: 3.8 mmol/L (ref 3.5–5.1)
Sodium: 132 mmol/L — ABNORMAL LOW (ref 135–145)

## 2022-06-12 LAB — GLUCOSE, CAPILLARY
Glucose-Capillary: 240 mg/dL — ABNORMAL HIGH (ref 70–99)
Glucose-Capillary: 128 mg/dL — ABNORMAL HIGH (ref 70–99)
Glucose-Capillary: 144 mg/dL — ABNORMAL HIGH (ref 70–99)
Glucose-Capillary: 156 mg/dL — ABNORMAL HIGH (ref 70–99)
Glucose-Capillary: 159 mg/dL — ABNORMAL HIGH (ref 70–99)

## 2022-06-12 LAB — AEROBIC/ANAEROBIC CULTURE W GRAM STAIN (SURGICAL/DEEP WOUND)

## 2022-06-12 MED ORDER — LORAZEPAM 1 MG PO TABS
1.0000 mg | ORAL_TABLET | Freq: Two times a day (BID) | ORAL | Status: DC | PRN
  Administered 2022-06-15: 1 mg via ORAL
  Filled 2022-06-12: qty 1

## 2022-06-12 NOTE — Progress Notes (Signed)
[facility] [place]  Subjective  - wants a cigarette    Objective 95/72 70 97.6 F (36.4 C) (Oral) 20 96%  Intake/Output Summary (Last 24 hours) at 06/12/2022 0809 Last data filed at 06/11/2022 2252 Gross per 24 hour  Intake 326.07 ml  Output --  Net 326.07 ml     Right groin vac to good suction Palpable DP  Ambulating in room Lungs non labored breathing General no acute distress   Assessment/Planning: [name] who is s/p: right femoral-popliteal embolectomy, 4 compartment fasciotomy and prevena placement on 05/07/2022 by Dr. Karin Lieu.  She was discharged home on DOAC.  She returned to hospital on 05/13/2022 and was taken to the operating room and underwent evacuation of hematoma, placement of Kerecis, and prevena wound vac by Dr. Myra Gianotti.  F/U visit in our office she had signs and symptoms of right groin infection.    POD # 1  Right groin washout, soft tissue debridement, antibiotic bead placement, myriad more cell powder 4 g placement, vacuum-assisted closure Wound size 12 cm x 4 cm x 4 cm Cultures pending final results: RARE GRAM POSITIVE COCCI   Xarelto restarted  Continue vancomycin / zosyn until cultures have been finalized. Heplock IV Urine OP > 1300 ml last 24 hours  VAC change at one week. Can be done in the outpt setting. Then twice a week with home health.  Home when cultures and sensitivities finalize.     [name] [name] 06/12/2022 8:09 AM --   Laboratory Lab Results: Recent Labs    06/10/22 0622 06/10/22 1055  WBC  --  12.9*  HGB 13.9 11.9*  HCT 41.0 35.9*  PLT  --  259    BMET Recent Labs    06/10/22 0622 06/10/22 1055  NA 134* 133*  K 4.2 4.2  CL 100 102  CO2  --  23  GLUCOSE 315* 173*  BUN 13 14  CREATININE 0.40* 0.55  CALCIUM  --  8.6*     COAG Lab Results  Component Value Date   INR 1.1 06/10/2022   INR 1.0 10/09/2021   INR 0.97 09/30/2017   No results found for: "PTT"

## 2022-06-12 NOTE — TOC Progression Note (Addendum)
Transition of Care [facility]) - Progression Note    Patient Details  Name: [name] [name] MRN: [id number] Date of Birth: 12-16-1980  Transition of Care [facility]) CM/SW Contact  Kermit Balo, RN Phone Number: 06/12/2022, 11:42 AM  Clinical Narrative:    CM has sent form for wound vac to [employer]. Will follow for availability of vac after approved with her insurance.  [facility] and [facility] have denied for home health services.  TOC following.  1529: [facility] has accepted for home health services. She will have a $80 co pay. Pt is aware and agreeable. Her mother is able to provide W-D dressing to site if there is an issue with wound vac at home until the [facility] RN is able to see her.   Expected Discharge Plan: Home w Home Health Services Barriers to Discharge: Continued Medical Work up  Expected Discharge Plan and Services   Discharge Planning Services: CM Consult Post Acute Care Choice: Home Health, Durable Medical Equipment Living arrangements for the past 2 months: Single Family Home                 DME Arranged: Vac DME Agency: [employer] Date DME Agency Contacted: 06/11/22 Time DME Agency Contacted: 1030 Representative spoke with at DME Agency: [facility] HH Arranged: RN           Social Determinants of Health (SDOH) Interventions SDOH Screenings   Food Insecurity: Patient Declined (05/14/2022)  Housing: Low Risk  (05/14/2022)  Transportation Needs: Patient Declined (05/14/2022)  Utilities: Patient Declined (05/14/2022)  Alcohol Screen: Low Risk  (07/12/2018)  Depression (PHQ2-9): Low Risk  (02/09/2022)  Tobacco Use: High Risk (06/11/2022)    Readmission Risk Interventions    05/09/2022    1:14 PM  Readmission Risk Prevention Plan  Post Dischage Appt Complete  Medication Screening Complete  Transportation Screening Complete

## 2022-06-12 NOTE — Progress Notes (Signed)
Patient is aware we will prescribe 1 month supply of Semglee upon discharge. After 1 month supply, patient will have to get further refills by PCP.  Loel Dubonnet, PA-C [facility] 06/12/2022, 8:33AM

## 2022-06-13 LAB — BASIC METABOLIC PANEL
Anion gap: 9 (ref 5–15)
BUN: 14 mg/dL (ref 6–20)
CO2: 25 mmol/L (ref 22–32)
Calcium: 8.9 mg/dL (ref 8.9–10.3)
Chloride: 100 mmol/L (ref 98–111)
Creatinine, Ser: 0.56 mg/dL (ref 0.44–1.00)
GFR, Estimated: >60 mL/min (ref >60)
Glucose, Bld: 155 mg/dL — ABNORMAL HIGH (ref 70–99)
Potassium: 4.1 mmol/L (ref 3.5–5.1)
Sodium: 134 mmol/L — ABNORMAL LOW (ref 135–145)

## 2022-06-13 LAB — CBC
HCT: 34.8 % — ABNORMAL LOW (ref 36.0–46.0)
Hemoglobin: 11.3 g/dL — ABNORMAL LOW (ref 12.0–15.0)
MCH: 27.8 pg (ref 26.0–34.0)
MCHC: 32.5 g/dL (ref 30.0–36.0)
MCV: 85.7 fL (ref 80.0–100.0)
Platelets: 302 10*3/uL (ref 150–400)
RBC: 4.06 MIL/uL (ref 3.87–5.11)
RDW: 12.9 % (ref 11.5–15.5)
WBC: 8.5 10*3/uL (ref 4.0–10.5)
nRBC: 0 % (ref 0.0–0.2)

## 2022-06-13 LAB — GLUCOSE, CAPILLARY
Glucose-Capillary: 153 mg/dL — ABNORMAL HIGH (ref 70–99)
Glucose-Capillary: 162 mg/dL — ABNORMAL HIGH (ref 70–99)
Glucose-Capillary: 160 mg/dL — ABNORMAL HIGH (ref 70–99)
Glucose-Capillary: 250 mg/dL — ABNORMAL HIGH (ref 70–99)

## 2022-06-13 NOTE — Progress Notes (Addendum)
  Progress Note    06/13/2022 8:17 AM 3 Days Post-Op  Subjective:  no major complaints. Some right thigh tenderness   Vitals:   06/13/22 0412 06/13/22 0739  BP: 104/64 112/71  Pulse: 67 70  Resp: 20 16  Temp: 97.7 F (36.5 C) 97.7 F (36.5 C)  SpO2: 96% 98%   Physical Exam: Cardiac:  regular Lungs:  non labored Incisions:  Right groin with VAC to suction Extremities:  RLE well perfused and warm with palpable DP Abdomen:  soft Neurologic: alert and oriented  CBC    Component Value Date/Time   WBC 8.5 06/13/2022 0105   RBC 4.06 06/13/2022 0105   HGB 11.3 (L) 06/13/2022 0105   HCT 34.8 (L) 06/13/2022 0105   PLT 302 06/13/2022 0105   MCV 85.7 06/13/2022 0105   MCH 27.8 06/13/2022 0105   MCHC 32.5 06/13/2022 0105   RDW 12.9 06/13/2022 0105   LYMPHSABS 2.9 05/07/2022 0521   MONOABS 0.7 05/07/2022 0521   EOSABS 0.1 05/07/2022 0521   BASOSABS 0.0 05/07/2022 0521    BMET    Component Value Date/Time   NA 134 (L) 06/13/2022 0105   NA 137 08/06/2021 1613   K 4.1 06/13/2022 0105   CL 100 06/13/2022 0105   CO2 25 06/13/2022 0105   GLUCOSE 155 (H) 06/13/2022 0105   BUN 14 06/13/2022 0105   BUN 11 08/06/2021 1613   CREATININE 0.56 06/13/2022 0105   CALCIUM 8.9 06/13/2022 0105   GFRNONAA >60 06/13/2022 0105   GFRAA >60 07/11/2018 2016    INR    Component Value Date/Time   INR 1.1 06/10/2022 1055     Intake/Output Summary (Last 24 hours) at 06/13/2022 0817 Last data filed at 06/12/2022 2000 Gross per 24 hour  Intake 360 ml  Output 100 ml  Net 260 ml     Assessment/Plan:  42 y.o. female is s/p right femoral-popliteal embolectomy, 4 compartment fasciotomy and prevena placement on 05/07/2022 by Dr. Karin Lieu. She was discharged home on DOAC. She returned to hospital on 05/13/2022 and was taken to the operating room and underwent evacuation of hematoma, placement of Kerecis, and prevena wound vac by Dr. Myra Gianotti.  3 Days Post-Op   Right groin with VAC to suction Home  VAC arranged  1st vac change will be on 6/7 in our office Wound Cultures pending final results/ sensitivities: GPC On Vanc/ Zosyn for now Xarelto restarted HH arranged with Emilia Beck to mobilize off the floor D/c when final cultures reported   Graceann Congress, PA-C [facility] [phone] 06/13/2022 8:17 AM  I have seen and evaluated the patient. I agree with the PA note as documented above.  Right groin VAC with good seal.  Awaiting cultures which are preliminary staph aureus with no sensitivities.  Awaiting home vac to be delivered.  Cephus Shelling, MD [facility] [place] Office: [phone]

## 2022-06-14 LAB — GLUCOSE, CAPILLARY
Glucose-Capillary: 172 mg/dL — ABNORMAL HIGH (ref 70–99)
Glucose-Capillary: 123 mg/dL — ABNORMAL HIGH (ref 70–99)
Glucose-Capillary: 129 mg/dL — ABNORMAL HIGH (ref 70–99)
Glucose-Capillary: 247 mg/dL — ABNORMAL HIGH (ref 70–99)

## 2022-06-14 LAB — AEROBIC/ANAEROBIC CULTURE W GRAM STAIN (SURGICAL/DEEP WOUND)

## 2022-06-14 NOTE — Progress Notes (Signed)
Rt groin wound VAC dressing, leaking slightly reinforced with appropriate drape. Currently intact. Per provider on call dressing to be changed in AM. Cattleya Dobratz, Randall An RN

## 2022-06-14 NOTE — Progress Notes (Signed)
Pharmacy Antibiotic Note  [name] [name] is a 42 y.o. female admitted on 06/10/2022 with groin hematoma and infection status post debridement.  Pharmacy has been consulted for zosyn and vancomycin dosing.   In April, she underwent right femoral popliteal embolectomy. She returned to hospital on 05/13/2022 and was taken to the operating room and underwent evacuation of hematoma, incision and drainage, and wound vac placement. vascular team is hoping adjust or discontinue antibiotic therapy once culture reports are finalized.   Her serum creatinine is <1 at 0.56 mg/dL and her white blood cell count has trended down from 12k on admission to 8k. Preliminary culture data showing staph aureus with pending susceptibilities. Ok to stop zosyn as preliminary culture data US showing no gram negative or anaerobic organisms.   Plan: Stop Zosyn  Continue Vancomycin 1250mg  IV q24h (est AUC 505 SCr used: 0.8, Vd 0.5 L /Kg (BMI 34)  Continue to follow up culture data, renal function, signs and symptoms of infection, and ability to narrow   Height: 5\' 1"  (154.9 cm) Weight: 81.6 kg (180 lb) IBW/kg (Calculated) : 47.8  Temp (24hrs), Avg:97.9 F (36.6 C), Min:97.6 F (36.4 C), Max:98.5 F (36.9 C)  Recent Labs  Lab 06/10/22 0622 06/10/22 1055 06/12/22 0745 06/13/22 0105  WBC  --  12.9* 7.6 8.5  CREATININE 0.40* 0.55 0.54 0.56     Estimated Creatinine Clearance: 86.8 mL/min (by C-G formula based on SCr of 0.56 mg/dL).    Allergies  Allergen Reactions   Sulfa Antibiotics Hives    Antimicrobials this admission: Zosyn 5/29 > 6/2 Vancomycin 5/29 >>  Dose adjustments this admission: N/A  Microbiology results: 5/29 Wound Culture: Positive for gram positive cocci, sensitivities pending, no anaerobes or gram negative organisms   Blane Ohara, PharmD  PGY1 Pharmacy Resident

## 2022-06-14 NOTE — Progress Notes (Addendum)
  Progress Note    06/14/2022 10:23 AM 4 Days Post-Op  Subjective:  no complaints   Vitals:   06/14/22 0440 06/14/22 0800  BP: 116/62 119/60  Pulse: 73   Resp: 20 19  Temp: 98.5 F (36.9 C) 97.7 F (36.5 C)  SpO2: 100%    Physical Exam: Cardiac:  regular Lungs:  non labored Incisions:  Right groin with VAC. Small leak, will reinforce Extremities:  well perfused and warm with palpable DP Neurologic: alert and oriented  CBC    Component Value Date/Time   WBC 8.5 06/13/2022 0105   RBC 4.06 06/13/2022 0105   HGB 11.3 (L) 06/13/2022 0105   HCT 34.8 (L) 06/13/2022 0105   PLT 302 06/13/2022 0105   MCV 85.7 06/13/2022 0105   MCH 27.8 06/13/2022 0105   MCHC 32.5 06/13/2022 0105   RDW 12.9 06/13/2022 0105   LYMPHSABS 2.9 05/07/2022 0521   MONOABS 0.7 05/07/2022 0521   EOSABS 0.1 05/07/2022 0521   BASOSABS 0.0 05/07/2022 0521    BMET    Component Value Date/Time   NA 134 (L) 06/13/2022 0105   NA 137 08/06/2021 1613   K 4.1 06/13/2022 0105   CL 100 06/13/2022 0105   CO2 25 06/13/2022 0105   GLUCOSE 155 (H) 06/13/2022 0105   BUN 14 06/13/2022 0105   BUN 11 08/06/2021 1613   CREATININE 0.56 06/13/2022 0105   CALCIUM 8.9 06/13/2022 0105   GFRNONAA >60 06/13/2022 0105   GFRAA >60 07/11/2018 2016    INR    Component Value Date/Time   INR 1.1 06/10/2022 1055     Intake/Output Summary (Last 24 hours) at 06/14/2022 1023 Last data filed at 06/14/2022 0440 Gross per 24 hour  Intake 745.07 ml  Output 75 ml  Net 670.07 ml     Assessment/Plan:  42 y.o. female is s/p right femoral-popliteal embolectomy, 4 compartment fasciotomy and prevena placement  4 Days Post-Op   VAC currently holding seal but  VAC with small leak. Will try to reinforce today. May need to change [facility] tomorrow RLE well perfused and warm with palpable DP Continue Vanc. Will d/c zosyn Pending final culture sensitivities D/c when final cultures reported   Graceann Congress, PA-C [facility] [phone] 06/14/2022 10:23 AM  I have seen and evaluated the patient. I agree with the PA note as documented above.  Continue vancomycin.  Will DC Zosyn.  Cultures growing Staph aureus.  Culture still preliminary.  Awaiting final cultures.  Awaiting delivery of wound VAC.  Likely next VAC change tomorrow.  Cephus Shelling, MD [facility] [place] Office: [phone]

## 2022-06-14 NOTE — Progress Notes (Signed)
Right groin dressing again leaking, addition drape placed, wound vac working at this time, Md aware will change dressing in am

## 2022-06-15 LAB — AEROBIC/ANAEROBIC CULTURE W GRAM STAIN (SURGICAL/DEEP WOUND)

## 2022-06-15 LAB — GLUCOSE, CAPILLARY: Glucose-Capillary: 153 mg/dL — ABNORMAL HIGH (ref 70–99)

## 2022-06-15 MED ORDER — OXYCODONE-ACETAMINOPHEN 5-325 MG PO TABS: 1.0000 | ORAL_TABLET | Freq: Four times a day (QID) | ORAL | 0 refills | Status: DC | PRN

## 2022-06-15 MED ORDER — "PEN NEEDLES 3/16"" 31G X 5 MM MISC": 1.0000 | Freq: Every day | 0 refills | Status: DC

## 2022-06-15 MED ORDER — CIPROFLOXACIN HCL 500 MG PO TABS: 500.0000 mg | ORAL_TABLET | Freq: Two times a day (BID) | ORAL | 0 refills | Status: DC

## 2022-06-15 MED ORDER — INSULIN GLARGINE-YFGN 100 UNIT/ML ~~LOC~~ SOPN: 28.0000 [IU] | PEN_INJECTOR | Freq: Every day | SUBCUTANEOUS | 0 refills | Status: DC

## 2022-06-15 MED ORDER — INSULIN GLARGINE-YFGN 100 UNIT/ML ~~LOC~~ SOLN: 28.0000 [IU] | Freq: Every day | SUBCUTANEOUS | 0 refills | Status: DC

## 2022-06-15 NOTE — Discharge Summary (Signed)
Discharge Summary  Patient ID: [name] [name] [id number] 42 y.o. 05/05/1980  Admit date: 06/10/2022  Discharge date and time: 06/15/2022 12:29 PM   Admitting Physician: [name], MD   Discharge Physician: [name], [facility]  Admission Diagnoses: PAD (peripheral artery disease) [facility]) [I73.9]  Discharge Diagnoses: PAD (peripheral artery disease) (HCC) [I73.9] Post operative wound infection  Admission Condition: stable  Discharged Condition: fair  Indication for Admission:  [name] [name] is a 42 y.o. female with history of right lower extremity iliofemoral, femoral-popliteal embolectomy for acute limb ischemia.  On postop day 5, she had a bleeding event requiring washout of the groin.  No bleeding vessel was appreciated.  She has been followed in the outpatient setting, but recently came in with purulence.  I discussed the risk and benefits of wound washout, Elibeth elected to proceed. We discussed the importance of blood glucose control, and smoking cessation as her blood sugars range in the 300s, and she continues to smoke on a daily basis.  Hospital Course: Ms. Rheingans was admitted on 06/10/22 and underwent Right groin washout, soft tissue debridement, antibiotic bead placement, myriad more cell powder 4 g placement, vacuum-assisted closure by Dr. Karin Lieu. She had previously undergone right lower extremity iliofemoral, femoral- popliteal embolectomy for CLI and presented to our office with purulent drainage . She was therefore indicated for washout in OR. She tolerated the procedure well and was taken to the recovery room in stable condition.   POD#1, she was doing well with VAC to suction and good seal in right groin. Her RLE well perfused and warm with palpable DP pulse. Her surgical cultures pending. Hemodynamically stable. Her home Xarelto restarted. She was continued on IV Vancomycin and Zosyn. She remained afebrile without any leukocytosis. A home VAC was ordered as well as  home health N for vac changes.   The remainder of her hospital course consisted of continued IV antibiotics awaiting final wound culture and sensitivities, pain control, and mobilization.  By POD#5, she remained stable for discharge home. Her VAC in right groin was changed prior to her discharge. Her final culture and sensitivities resulted with sensitives to Bactrim and Cipro. She as a sulfa antibiotic allergy so a prescription for Cipro 500 Mg BID x 14 days was sent to her pharmacy. PDMP was reviewed and post operative pain medication was also sent to her pharmacy. She was also provided with 30 days of her Semglee since she was out of this and needed to see PCP for refill. She was also provided with prescription for Aspirin 81 mg daily, Xarelto 20 mg daily, and rosuvastatin 20 mg daily. She will follow up in our office for her next VAC change on 06/19/22. Home health RN with [facility] will then resume 3x/week VAC changes following her office visit on 06/22/22.   Consults: None  Treatments: antibiotics: vancomycin and Zosyn, analgesia: acetaminophen, Dilaudid, and oxycodone, anticoagulation: sq. Heparin, Xarelto, therapies: RN and SW, and surgery: Right groin washout, soft tissue debridement, antibiotic bead placement, myriad more cell powder 4 g placement, vacuum-assisted closure Wound size 12 cm x 4 cm x 4 cm   Disposition: Discharge disposition: 01-Home or Self Care       Patient Instructions:  Allergies as of 06/15/2022       Reactions   Sulfa Antibiotics Hives        Medication List     STOP taking these medications    amoxicillin-clavulanate 875-125 MG tablet Commonly known as: AUGMENTIN  TAKE these medications    Accu-Chek Guide test strip Generic drug: glucose blood Use as instructed   Accu-Chek Guide w/Device Kit 1 each by Other route 4 (four) times daily -  before meals and at bedtime.   accu-chek multiclix lancets Use as instructed   acetaminophen 500 MG  tablet Commonly known as: TYLENOL Take 500-1,000 mg by mouth every 6 (six) hours as needed (pain.).   aspirin EC 81 MG tablet Take 1 tablet (81 mg total) by mouth daily at 6 (six) AM. Swallow whole. What changed: additional instructions   ciprofloxacin 500 MG tablet Commonly known as: Cipro Take 1 tablet (500 mg total) by mouth 2 (two) times daily for 14 days.   gabapentin 300 MG capsule Commonly known as: NEURONTIN TAKE 1 CAPSULE BY MOUTH THREE TIMES DAILY   insulin glargine-yfgn 100 UNIT/ML Pen Commonly known as: SEMGLEE Inject 28 Units into the skin at bedtime.   lisinopril 40 MG tablet Commonly known as: ZESTRIL Take 1 tablet by mouth once daily   metFORMIN 500 MG 24 hr tablet Commonly known as: GLUCOPHAGE-XR TAKE 2 TABLETS BY MOUTH TWICE DAILY WITH A MEAL   oxyCODONE-acetaminophen 5-325 MG tablet Commonly known as: PERCOCET/ROXICET Take 1 tablet by mouth every 6 (six) hours as needed for moderate pain. What changed:  how much to take when to take this   Pen Needles 3/16" 31G X 5 MM Misc 1 each by Does not apply route daily. Use to inject insulin   rivaroxaban 20 MG Tabs tablet Commonly known as: XARELTO Take 1 tablet (20 mg total) by mouth daily. What changed: when to take this   rosuvastatin 20 MG tablet Commonly known as: CRESTOR Take 1 tablet (20 mg total) by mouth daily. What changed: when to take this               Durable Medical Equipment  (From admission, onward)           Start     Ordered   06/12/22 0830  For home use only DME Negative pressure wound device  Once       Comments: First wound vac change will be done by Vascular surgery in 1 week. After 1st vac change, please begin 2x weekly wound vac changes by [facility]  Current wound size 12x4x4cm  Question Answer Comment  Frequency of dressing change 2 times per week   Length of need 3 Months   Dressing type Foam   Amount of suction 125 mm/Hg   Pressure application Continuous  pressure   Supplies 10 canisters and 15 dressings per month for duration of therapy      06/12/22 0835              Discharge Care Instructions  (From admission, onward)           Start     Ordered   06/15/22 0000  Discharge wound care:       Comments: Keep VAC in place. VAC will be changed on 06/17/22 by [facility] RN, you will then have following VAC change on 6/7 at you Vascular and Vein office appointment. Please make sure to bring your [facility] supplies to your visit   06/15/22 0952           Activity: activity as tolerated, no driving while on analgesics, and no heavy lifting for 4 weeks Diet: low fat, low cholesterol diet Wound Care: Keep VAC in place until Home health RN changes in on 06/17/22. Next VAC  change to follow in office on 06/19/22. Home health will then resume changes on the following MWF until wound closed  Follow-up with [facility] in 4 days.  SignedGraceann Congress, PA-C 06/15/2022 1:25 PM [facility] Office: [phone]

## 2022-06-15 NOTE — TOC Transition Note (Signed)
Transition of Care (TOC) - CM/SW Discharge Note Donn Pierini RN, BSN Transitions of Care Unit 4E- RN Case Manager See Treatment Team for direct phone #   Patient Details  Name: [name] [name] MRN: [id number] Date of Birth: 1980/11/12  [facility] CM/SW Contact:  Darrold Span, RN Phone Number: 06/15/2022, 12:12 PM   Clinical Narrative:    Pt stable for transition home today, wound VAC drsg has been changed this am per [facility]- per Antony Blackbird- HH will need to do drsg change on Wed 6/5 then pt will come to [facility] office on Friday 6/7- HH will resume VAC drsg changes on Monday 6/10.   Call made to [employer] liaison - pt has been approved for home VAC- POD to be emailed to this CM for delivery.   Call made to [facility] and confirmed start of care for [facility] on 6/5- they will contact pt to schedule.   CM- delivered home wound VAC to the bedside- signed POD faxed back to [facility]  and copies placed on chart- pt provided original paperwork along with supplies.   Pt voiced she has transportation home, No further TOC needs noted.    Final next level of care: Home w Home Health Services Barriers to Discharge: Barriers Resolved   Patient Goals and CMS Choice CMS Medicare.gov Compare Post Acute Care list provided to:: Patient Choice offered to / list presented to : Patient  Discharge Placement                 Home w/ [facility]        Discharge Plan and Services Additional resources added to the After Visit Summary for     Discharge Planning Services: CM Consult Post Acute Care Choice: Home Health, Durable Medical Equipment          DME Arranged: Vac DME Agency: [employer] Date DME Agency Contacted: 06/11/22 Time DME Agency Contacted: 1030 Representative spoke with at DME Agency: [facility] HH Arranged: RN HH Agency: [facility] Date [facility] Agency Contacted: 06/15/22 Time HH Agency Contacted: 1100 Representative spoke with at [facility] Agency: Kandee Keen  Social Determinants of Health  (SDOH) Interventions SDOH Screenings   Food Insecurity: Patient Declined (05/14/2022)  Housing: Low Risk  (05/14/2022)  Transportation Needs: Patient Declined (05/14/2022)  Utilities: Patient Declined (05/14/2022)  Alcohol Screen: Low Risk  (07/12/2018)  Depression (PHQ2-9): Low Risk  (02/09/2022)  Tobacco Use: High Risk (06/11/2022)     Readmission Risk Interventions    06/15/2022   12:12 PM 05/09/2022    1:14 PM  Readmission Risk Prevention Plan  Post Dischage Appt  Complete  Medication Screening  Complete  Transportation Screening Complete Complete  PCP or Specialist Appt within 5-7 Days Complete   Home Care Screening Complete   Medication Review (RN CM) Complete

## 2022-06-15 NOTE — Progress Notes (Signed)
  Progress Note    06/15/2022 8:20 AM 5 Days Post-Op  Subjective:  feeling fine just concerned about her wound vac needing a change    Vitals:   06/15/22 0320 06/15/22 0747  BP: 102/71   Pulse: 68   Resp: 20   Temp: 98.4 F (36.9 C) 98 F (36.7 C)  SpO2: 94%     Physical Exam: General:  no acute distress Cardiac:  regular Lungs:  nonlabored Incisions:  right groin with wound vac working but not a good seal, will change today at bedside Extremities:  palpable right DP  CBC    Component Value Date/Time   WBC 8.5 06/13/2022 0105   RBC 4.06 06/13/2022 0105   HGB 11.3 (L) 06/13/2022 0105   HCT 34.8 (L) 06/13/2022 0105   PLT 302 06/13/2022 0105   MCV 85.7 06/13/2022 0105   MCH 27.8 06/13/2022 0105   MCHC 32.5 06/13/2022 0105   RDW 12.9 06/13/2022 0105   LYMPHSABS 2.9 05/07/2022 0521   MONOABS 0.7 05/07/2022 0521   EOSABS 0.1 05/07/2022 0521   BASOSABS 0.0 05/07/2022 0521    BMET    Component Value Date/Time   NA 134 (L) 06/13/2022 0105   NA 137 08/06/2021 1613   K 4.1 06/13/2022 0105   CL 100 06/13/2022 0105   CO2 25 06/13/2022 0105   GLUCOSE 155 (H) 06/13/2022 0105   BUN 14 06/13/2022 0105   BUN 11 08/06/2021 1613   CREATININE 0.56 06/13/2022 0105   CALCIUM 8.9 06/13/2022 0105   GFRNONAA >60 06/13/2022 0105   GFRAA >60 07/11/2018 2016    INR    Component Value Date/Time   INR 1.1 06/10/2022 1055     Intake/Output Summary (Last 24 hours) at 06/15/2022 0820 Last data filed at 06/15/2022 0320 Gross per 24 hour  Intake 370 ml  Output 25 ml  Net 345 ml       Assessment/Plan:  42 y.o. female is 5 days post op, s/p: right groin washout with myriad morcell and wound vac placement   -Right groin wound vac has required multiple patches over the weekend to keep a good seal. It continues to leak. Will plan vac change at bedside today with Dr.Khristi Schiller -No pain in RLE. Leg is well perfused with palpable DP -On vanc currently. Cultures show abx sensitivity  to Bactrim and Cipro for oral meds -Planning for discharge home today after wound vac is changed and home wound vac is delivered to the room. Will d/c on oral abx and with 1 month refill of diabetes meds -Continue ASA and Xarelto  Loel Dubonnet, PA-C [facility] [phone] 06/15/2022 8:20 AM  VASCULAR STAFF ADDENDUM: I have independently interviewed and examined the patient. I agree with the above.  VAC change. Some nonviable slough removed from myriad morcel powder and abx beads Will send home on abx course    J. Gillis Santa, MD [facility] Phone Number: [phone] 06/15/2022 8:20 AM

## 2022-06-15 NOTE — Progress Notes (Signed)
  Progress Note    06/15/2022 7:41 AM 5 Days Post-Op  Subjective:  feeling fine just concerned about her wound vac needing a change    Vitals:   06/14/22 2210 06/15/22 0320  BP: 99/62 102/71  Pulse: 76 68  Resp: 20 20  Temp: 98.2 F (36.8 C) 98.4 F (36.9 C)  SpO2: 91% 94%    Physical Exam: General:  no acute distress Cardiac:  regular Lungs:  nonlabored Incisions:  right groin with wound vac working but not a good seal, will change today at bedside Extremities:  palpable right DP  CBC    Component Value Date/Time   WBC 8.5 06/13/2022 0105   RBC 4.06 06/13/2022 0105   HGB 11.3 (L) 06/13/2022 0105   HCT 34.8 (L) 06/13/2022 0105   PLT 302 06/13/2022 0105   MCV 85.7 06/13/2022 0105   MCH 27.8 06/13/2022 0105   MCHC 32.5 06/13/2022 0105   RDW 12.9 06/13/2022 0105   LYMPHSABS 2.9 05/07/2022 0521   MONOABS 0.7 05/07/2022 0521   EOSABS 0.1 05/07/2022 0521   BASOSABS 0.0 05/07/2022 0521    BMET    Component Value Date/Time   NA 134 (L) 06/13/2022 0105   NA 137 08/06/2021 1613   K 4.1 06/13/2022 0105   CL 100 06/13/2022 0105   CO2 25 06/13/2022 0105   GLUCOSE 155 (H) 06/13/2022 0105   BUN 14 06/13/2022 0105   BUN 11 08/06/2021 1613   CREATININE 0.56 06/13/2022 0105   CALCIUM 8.9 06/13/2022 0105   GFRNONAA >60 06/13/2022 0105   GFRAA >60 07/11/2018 2016    INR    Component Value Date/Time   INR 1.1 06/10/2022 1055     Intake/Output Summary (Last 24 hours) at 06/15/2022 0741 Last data filed at 06/15/2022 0320 Gross per 24 hour  Intake 370 ml  Output 25 ml  Net 345 ml      Assessment/Plan:  42 y.o. female is 5 days post op, s/p: right groin washout with myriad morcell and wound vac placement   -Right groin wound vac has required multiple patches over the weekend to keep a good seal. It continues to leak. Will plan vac change at bedside today with Dr.Robins -No pain in RLE. Leg is well perfused with palpable DP -On vanc currently. Cultures show abx  sensitivity to Bactrim and Cipro for oral meds -Planning for discharge home today after wound vac is changed and home wound vac is delivered to the room. Will d/c on oral abx and with 1 month refill of diabetes meds -Continue ASA and Xarelto  [name] Dubonnet, PA-C [facility] [phone] 06/15/2022 7:41 AM

## 2022-06-15 NOTE — Progress Notes (Signed)
Discharge instructions reviewed with pt.  Copy of instructions given to pt. Pt informed her scripts were sent to her pharmacy for pick up. Pt verbalized understanding of instructions, when to call MD and follow up appointments. Pt has wound VAC in place, and supplies taken with her at discharge.  Pt d/c'd via wheelchair with belongings, pt's ride coming to pick her up. Pt will be taken to discharge lounge.           Escorted by staff.   Annice Needy, RN SWOT

## 2022-06-19 ENCOUNTER — Ambulatory Visit (INDEPENDENT_AMBULATORY_CARE_PROVIDER_SITE_OTHER): Payer: No Typology Code available for payment source | Admitting: Physician Assistant

## 2022-06-19 VITALS — BP 123/78 | HR 65 | Temp 97.8°F | Resp 18 | Ht 61.0 in | Wt 185.2 lb

## 2022-06-19 DIAGNOSIS — I998 Other disorder of circulatory system: Secondary | ICD-10-CM

## 2022-06-19 DIAGNOSIS — T8149XA Infection following a procedure, other surgical site, initial encounter: Secondary | ICD-10-CM

## 2022-06-19 NOTE — Progress Notes (Signed)
POST OPERATIVE OFFICE NOTE    CC:  F/u for surgery  HPI:  [name] who is s/p right groin washout and VAC placement by Dr. Sherral Hammers on 06/10/2022.  She initially underwent right femoral to popliteal embolectomy with 4 compartment fasciotomy on 05/07/2022.  She unfortunately required exploration of the right groin due to a large hematoma on 05/13/2022.  She has been unable to heal the groin wound.  She is active with home health who came to change the wound VAC 2 days ago.  She believes the wound bed is healthy appearing based on the report from the home health nurse.  Patient states she has stopped smoking over the past 10 days and has been focusing on her glucose levels.  She states her glucose has improved from an average of 300 to about 120.  She would like to have her employer updated to be out of work further than June 17.  Allergies  Allergen Reactions   Sulfa Antibiotics Hives    Current Outpatient Medications  Medication Sig Dispense Refill   acetaminophen (TYLENOL) 500 MG tablet Take 500-1,000 mg by mouth every 6 (six) hours as needed (pain.).     aspirin EC 81 MG tablet Take 1 tablet (81 mg total) by mouth daily at 6 (six) AM. Swallow whole. (Patient taking differently: Take 81 mg by mouth daily at 6 (six) AM.) 30 tablet 12   Blood Glucose Monitoring Suppl (ACCU-CHEK GUIDE) w/Device KIT 1 each by Other route 4 (four) times daily -  before meals and at bedtime. 1 kit 0   ciprofloxacin (CIPRO) 500 MG tablet Take 1 tablet (500 mg total) by mouth 2 (two) times daily for 14 days. 20 tablet 0   gabapentin (NEURONTIN) 300 MG capsule TAKE 1 CAPSULE BY MOUTH THREE TIMES DAILY 270 capsule 0   glucose blood (ACCU-CHEK GUIDE) test strip Use as instructed 100 each 12   insulin glargine-yfgn (SEMGLEE) 100 UNIT/ML Pen Inject 28 Units into the skin at bedtime. 9 mL 0   Insulin Pen Needle (PEN NEEDLES 3/16") 31G X 5 MM MISC 1 each by Does not apply route daily. Use to inject insulin 100  each 0   Lancets (ACCU-CHEK MULTICLIX) lancets Use as instructed 100 each 12   lisinopril (ZESTRIL) 40 MG tablet Take 1 tablet by mouth once daily 90 tablet 0   metFORMIN (GLUCOPHAGE-XR) 500 MG 24 hr tablet TAKE 2 TABLETS BY MOUTH TWICE DAILY WITH A MEAL 120 tablet 2   oxyCODONE-acetaminophen (PERCOCET/ROXICET) 5-325 MG tablet Take 1 tablet by mouth every 6 (six) hours as needed for moderate pain. 30 tablet 0   rivaroxaban (XARELTO) 20 MG TABS tablet Take 1 tablet (20 mg total) by mouth daily. (Patient taking differently: Take 20 mg by mouth daily with supper.) 30 tablet 3   rosuvastatin (CRESTOR) 20 MG tablet Take 1 tablet (20 mg total) by mouth daily. (Patient taking differently: Take 20 mg by mouth every evening.) 30 tablet 11   No current facility-administered medications for this visit.     ROS:  See HPI  Physical Exam:  Vitals:   06/19/22 0955  BP: 123/78  Pulse: 65  Resp: 18  Temp: 97.8 F (36.6 C)  TempSrc: Temporal  SpO2: 95%  Weight: 185 lb 3.2 oz (84 kg)  Height: 5\' 1"  (1.549 m)    Incision: Right groin with healthy appearing wound bed, no exposed vasculature;  wound measures 8 L x 3 W x 6cm D Extremities:  Feet are warm and well-perfused Neuro: A&O    Assessment/Plan:  [name] who is s/p: Right groin washout with VAC placement  -Wound VAC was changed in the office today.  There appears to be healthy wound bed without any exposed vasculature at the base.  Wound measures 8 x 3 x 6 cm.  She will continue home health for [facility] changes 3 days/week.  We will reassess the wound in 2 weeks.  She will continue smoking cessation as well as focusing on her glucose.  She knows to call/return office sooner with any questions or concerns.  We will also update her return to work status with short-term disability.  She has a return date of June 17 however her incision will not be healed by then.   Emilie Rutter, PA-C [facility] [phone]  Clinic MD:  Karin Lieu

## 2022-06-22 ENCOUNTER — Other Ambulatory Visit: Payer: Self-pay | Admitting: Physician Assistant

## 2022-06-22 MED ORDER — CIPROFLOXACIN HCL 500 MG PO TABS: 500.0000 mg | ORAL_TABLET | Freq: Two times a day (BID) | ORAL | 0 refills | Status: DC

## 2022-07-03 ENCOUNTER — Ambulatory Visit (INDEPENDENT_AMBULATORY_CARE_PROVIDER_SITE_OTHER): Payer: No Typology Code available for payment source | Admitting: Physician Assistant

## 2022-07-03 VITALS — BP 115/76 | HR 84 | Temp 97.8°F | Resp 14 | Ht 61.0 in | Wt 184.0 lb

## 2022-07-03 DIAGNOSIS — T8149XA Infection following a procedure, other surgical site, initial encounter: Secondary | ICD-10-CM

## 2022-07-03 MED ORDER — FLUCONAZOLE 150 MG PO TABS: 150.0000 mg | ORAL_TABLET | Freq: Every day | ORAL | 0 refills | Status: DC

## 2022-07-03 NOTE — Progress Notes (Signed)
POST OPERATIVE OFFICE NOTE    CC:  F/u for surgery  HPI:  [name] who is s/p right groin washout and VAC placement by Dr. Karin Lieu on 06/10/2022. She initially underwent right femoral to popliteal embolectomy with 4 compartment fasciotomy on 05/07/2022. She unfortunately required exploration of the right groin due to a large hematoma on 05/13/2022.  Today she complains of horrible pain in the right groin which she attributes to raw and irritated peri-incisional skin from frequent VAC changes.  She is active with home health and receiving VAC changes 3 days/week.  She is on aspirin and Xarelto daily.  Allergies  Allergen Reactions   Sulfa Antibiotics Hives    Current Outpatient Medications  Medication Sig Dispense Refill   acetaminophen (TYLENOL) 500 MG tablet Take 500-1,000 mg by mouth every 6 (six) hours as needed (pain.).     aspirin EC 81 MG tablet Take 1 tablet (81 mg total) by mouth daily at 6 (six) AM. Swallow whole. (Patient taking differently: Take 81 mg by mouth daily at 6 (six) AM.) 30 tablet 12   Blood Glucose Monitoring Suppl (ACCU-CHEK GUIDE) w/Device KIT 1 each by Other route 4 (four) times daily -  before meals and at bedtime. 1 kit 0   fluconazole (DIFLUCAN) 150 MG tablet Take 1 tablet (150 mg total) by mouth daily. 5 tablet 0   gabapentin (NEURONTIN) 300 MG capsule TAKE 1 CAPSULE BY MOUTH THREE TIMES DAILY 270 capsule 0   glucose blood (ACCU-CHEK GUIDE) test strip Use as instructed 100 each 12   insulin glargine-yfgn (SEMGLEE) 100 UNIT/ML Pen Inject 28 Units into the skin at bedtime. 9 mL 0   Insulin Pen Needle (PEN NEEDLES 3/16") 31G X 5 MM MISC 1 each by Does not apply route daily. Use to inject insulin 100 each 0   Lancets (ACCU-CHEK MULTICLIX) lancets Use as instructed 100 each 12   lisinopril (ZESTRIL) 40 MG tablet Take 1 tablet by mouth once daily 90 tablet 0   metFORMIN (GLUCOPHAGE-XR) 500 MG 24 hr tablet TAKE 2 TABLETS BY MOUTH TWICE DAILY WITH A MEAL  120 tablet 2   oxyCODONE-acetaminophen (PERCOCET/ROXICET) 5-325 MG tablet Take 1 tablet by mouth every 6 (six) hours as needed for moderate pain. 30 tablet 0   rivaroxaban (XARELTO) 20 MG TABS tablet Take 1 tablet (20 mg total) by mouth daily. (Patient taking differently: Take 20 mg by mouth daily with supper.) 30 tablet 3   rosuvastatin (CRESTOR) 20 MG tablet Take 1 tablet (20 mg total) by mouth daily. (Patient taking differently: Take 20 mg by mouth every evening.) 30 tablet 11   ciprofloxacin (CIPRO) 500 MG tablet Take 1 tablet (500 mg total) by mouth 2 (two) times daily. 8 tablet 0   No current facility-administered medications for this visit.     ROS:  See HPI  Physical Exam:  Vitals:   07/03/22 0905  BP: 115/76  Pulse: 84  Resp: 14  Temp: 97.8 F (36.6 C)  TempSrc: Temporal  SpO2: 98%  Weight: 184 lb (83.5 kg)  Height: 5\' 1"  (1.549 m)    Incision: Wound bed granulating with no sign of purulence; skin irritation surrounding incision from lower abdomen down into the groin fold Extremities: Feet are warm and well-perfused  Assessment/Plan:  [name] who is s/p: Right groin exploration and hematoma evacuation  -Right groin wound bed continues to improve.  There is 100% granulation tissue and no sign of infection.  Unfortunately the frequent VAC changes have irritated the peri-incisional skin.  Groin fold also has somewhat of an odor and skin lesions with yeastlike appearance.  Because of this reason we will discontinue the wound VAC and switch to wet-to-dry dressing changes.  I have also prescribed a short course of Diflucan.  Her mother was with her during office visit today and was educated on how to perform wet-to-dry packing for the right groin wound.  She will follow-up in 2 to 3 weeks for another wound check.  She will return office sooner with any problems.   Emilie Rutter, PA-C [facility] [phone]  Clinic MD:  Myra Gianotti

## 2022-07-06 ENCOUNTER — Telehealth: Payer: Self-pay

## 2022-07-06 NOTE — Telephone Encounter (Signed)
April, RN with [facility] called for clarification on wound care orders.  Reviewed pt's chart, returned call for clarification, two identifiers used. Informed her that the wound vac was d/c'd d/t skin irritation and the pt should do wet-to-dry dressings until her f/u visit. Confirmed understanding.

## 2022-07-07 ENCOUNTER — Other Ambulatory Visit: Payer: Self-pay | Admitting: Family

## 2022-07-07 DIAGNOSIS — Z794 Long term (current) use of insulin: Secondary | ICD-10-CM

## 2022-07-07 DIAGNOSIS — E1141 Type 2 diabetes mellitus with diabetic mononeuropathy: Secondary | ICD-10-CM

## 2022-07-08 NOTE — Telephone Encounter (Signed)
Requested Prescriptions  Pending Prescriptions Disp Refills   gabapentin (NEURONTIN) 300 MG capsule [Pharmacy Med Name: Gabapentin 300 MG Oral Capsule] 270 capsule 0    Sig: TAKE 1 CAPSULE BY MOUTH THREE TIMES DAILY     Neurology: Anticonvulsants - gabapentin Passed - 07/07/2022  9:16 AM      Passed - Cr in normal range and within 360 days    Creatinine, Ser  Date Value Ref Range Status  06/13/2022 0.56 0.44 - 1.00 mg/dL Final   Creatinine, Urine  Date Value Ref Range Status  05/28/2014 56.00 mg/dL Final         Passed - Completed PHQ-2 or PHQ-9 in the last 360 days      Passed - Valid encounter within last 12 months    Recent Outpatient Visits           4 months ago Primary hypertension   [place] [facility], [place], NP   5 months ago Primary hypertension   [place] [facility], Amy J, NP   8 months ago Primary hypertension   [place] [facility], Amy J, NP   9 months ago Type 2 diabetes mellitus with diabetic mononeuropathy, with long-term current use of insulin (HCC)   [place] [facility], Amy J, NP   10 months ago Type 2 diabetes mellitus with diabetic mononeuropathy, with long-term current use of insulin [facility])    [facility], [place], NP       Future Appointments             In 3 weeks Branch, Alben Spittle, MD  [facility]   In 4 months Shamleffer, Konrad Dolores, MD [facility] Endocr[facility]ology

## 2022-07-14 NOTE — Progress Notes (Unsigned)
Patient ID: [name] [name], female    DOB: 07/17/1980  MRN: [id number]  CC: Chronic Care Management   Subjective: [name] [name] is a 42 y.o. female who presents for chronic care management.  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   Hypertensive retinopathy of both eyes 11/08/2018   Nuclear age-related cataract, both eyes 11/08/2018   MDD (major depressive disorder), recurrent episode, moderate (HCC) 08/04/2018   PTSD (post-traumatic stress disorder) 08/04/2018   MDD (major depressive disorder), severe (HCC) 07/12/2018   GERD (gastroesophageal reflux disease) 02/17/2018   Type 2 diabetes mellitus without complication, without long-term current use of insulin (HCC) 02/17/2018   S/P cesarean section 06/01/2014   S/P tubal ligation 06/01/2014   H/O cesarean section complicating pregnancy 04/18/2014   Emotional disorder (HCC) 03/16/2013   Tobacco user 03/16/2013   Acute chest pain 03/01/2013   HTN (hypertension) 09/28/2012   DM (diabetes mellitus) (HCC) 11/06/2011     Current Outpatient Medications on File Prior to Visit  Medication Sig Dispense Refill   acetaminophen (TYLENOL) 500 MG tablet Take 500-1,000 mg by mouth every 6 (six) hours as needed (pain.).     aspirin EC 81 MG tablet Take 1 tablet (81 mg total) by mouth daily at 6 (six) AM. Swallow whole. (Patient taking differently: Take 81 mg by mouth daily at 6 (six) AM.) 30 tablet 12   Blood Glucose Monitoring Suppl (ACCU-CHEK GUIDE) w/Device KIT 1 each by Other route 4 (four) times daily -  before meals and at bedtime. 1 kit 0   ciprofloxacin (CIPRO) 500 MG tablet Take 1 tablet (500 mg total) by mouth 2 (two) times daily. 8 tablet 0   fluconazole  (DIFLUCAN) 150 MG tablet Take 1 tablet (150 mg total) by mouth daily. 5 tablet 0   gabapentin (NEURONTIN) 300 MG capsule TAKE 1 CAPSULE BY MOUTH THREE TIMES DAILY 270 capsule 0   glucose blood (ACCU-CHEK GUIDE) test strip Use as instructed 100 each 12   insulin glargine-yfgn (SEMGLEE) 100 UNIT/ML Pen Inject 28 Units into the skin at bedtime. 9 mL 0   Insulin Pen Needle (PEN NEEDLES 3/16") 31G X 5 MM MISC 1 each by Does not apply route daily. Use to inject insulin 100 each 0   Lancets (ACCU-CHEK MULTICLIX) lancets Use as instructed 100 each 12   lisinopril (ZESTRIL) 40 MG tablet Take 1 tablet by mouth once daily 90 tablet 0   metFORMIN (GLUCOPHAGE-XR) 500 MG 24 hr tablet TAKE 2 TABLETS BY MOUTH TWICE DAILY WITH A MEAL 120 tablet 2   oxyCODONE-acetaminophen (PERCOCET/ROXICET) 5-325 MG tablet Take 1 tablet by mouth every 6 (six) hours as needed for moderate pain. 30 tablet 0   rivaroxaban (XARELTO) 20 MG TABS tablet Take 1 tablet (20 mg total) by mouth daily. (Patient taking differently: Take 20 mg by mouth daily with supper.) 30 tablet 3   rosuvastatin (CRESTOR) 20 MG tablet Take 1 tablet (20 mg total) by mouth daily. (Patient taking differently: Take 20 mg by mouth every evening.) 30 tablet 11   No current facility-administered medications on file prior to visit.    Allergies  Allergen Reactions   Sulfa Antibiotics Hives    Social History   Socioeconomic History  Marital status: Legally Separated    Spouse name: Not on file   Number of children: Not on file   Years of education: Not on file   Highest education level: GED or equivalent  Occupational History   Not on file  Tobacco Use   Smoking status: Every Day    Packs/day: 1.00    Years: 12.00    Additional pack years: 0.00    Total pack years: 12.00    Types: Cigarettes    Passive exposure: Current   Smokeless tobacco: Never  Vaping Use   Vaping Use: Never used  Substance and Sexual Activity   Alcohol use: No   Drug use:  No   Sexual activity: Yes    Birth control/protection: None  Other Topics Concern   Not on file  Social History Narrative   Not on file   Social Determinants of Health   Financial Resource Strain: Medium Risk (07/11/2022)   Overall Financial Resource Strain (CARDIA)    Difficulty of Paying Living Expenses: Somewhat hard  Food Insecurity: No Food Insecurity (07/11/2022)   Hunger Vital Sign    Worried About Running Out of Food in the Last Year: Never true    Ran Out of Food in the Last Year: Never true  Transportation Needs: No Transportation Needs (07/11/2022)   PRAPARE - Administrator, Civil Service (Medical): No    Lack of Transportation (Non-Medical): No  Physical Activity: Unknown (07/11/2022)   Exercise Vital Sign    Days of Exercise per Week: 0 days    Minutes of Exercise per Session: Not on file  Stress: Stress Concern Present (07/11/2022)   [employer] of Occupational Health - Occupational Stress Questionnaire    Feeling of Stress : To some extent  Social Connections: Moderately Isolated (07/11/2022)   Social Connection and Isolation Panel [NHANES]    Frequency of Communication with Friends and Family: More than three times a week    Frequency of Social Gatherings with Friends and Family: Patient declined    Attends Religious Services: 1 to 4 times per year    Active Member of [employer] or Organizations: No    Attends Engineer, structural: Not on file    Marital Status: Separated  Intimate Partner Violence: Patient Declined (05/14/2022)   Humiliation, Afraid, Rape, and Kick questionnaire    Fear of Current or Ex-Partner: Patient declined    Emotionally Abused: Patient declined    Physically Abused: Patient declined    Sexually Abused: Patient declined    Family History  Problem Relation Age of Onset   Diabetes Maternal Grandmother    Diabetes Paternal Grandmother     Past Surgical History:  Procedure Laterality Date   APPLICATION OF WOUND VAC  Right 05/13/2022   Procedure: APPLICATION OF PREVENA WOUND VAC;  Surgeon: Nada Libman, MD;  Location: [facility] OR;  Service: Vascular;  Laterality: Right;   APPLICATION OF WOUND VAC Right 06/10/2022   Procedure: APPLICATION OF WOUND VAC;  Surgeon: [name], MD;  Location: [facility] OR;  Service: Vascular;  Laterality: Right;   CESAREAN SECTION     CESAREAN SECTION WITH BILATERAL TUBAL LIGATION Bilateral 06/01/2014   Procedure: CESAREAN SECTION WITH BILATERAL TUBAL LIGATION;  Surgeon: Sherian Rein, MD;  Location: [facility];  Service: Obstetrics;  Laterality: Bilateral;  MD requests RNFA   CHEST TUBE INSERTION     FEMORAL ARTERY EXPLORATION Right 05/13/2022   Procedure: RIGHTGROIN EXPLORATION , HEMATOMA EVACUATION;  Surgeon: Nada Libman,  MD;  Location: [facility] OR;  Service: Vascular;  Laterality: Right;   FEMORAL-POPLITEAL BYPASS GRAFT Right 05/07/2022   Procedure: RIGHT FEMORAL-POPLITEAL ARTERY EMBOLECTOMY AND 4  COMPARMENT FASCIOTOMY;  Surgeon: [name], MD;  Location: [facility] OR;  Service: Vascular;  Laterality: Right;   TONSILLECTOMY     TRACHEOSTOMY     WOUND EXPLORATION Right 06/10/2022   Procedure: IRRGATION AND DEBRIDMENT OF RIGHT GROIN;  Surgeon: [name], MD;  Location: [facility];  Service: Vascular;  Laterality: Right;    ROS: Review of Systems Negative except as stated above  PHYSICAL EXAM: LMP 06/01/2022 (Approximate)   Physical Exam  {female adult master:310786} {female adult master:310785}     Latest Ref Rng & Units 06/13/2022    1:05 AM 06/12/2022    7:45 AM 06/10/2022   10:55 AM  CMP  Glucose 70 - 99 mg/dL 161  096  045   BUN 6 - 20 mg/dL 14  10  14    Creatinine 0.44 - 1.00 mg/dL 4.09  8.11  9.14   Sodium 135 - 145 mmol/L 134  132  133   Potassium 3.5 - 5.1 mmol/L 4.1  3.8  4.2   Chloride 98 - 111 mmol/L 100  98  102   CO2 22 - 32 mmol/L 25  23  23    Calcium 8.9 - 10.3 mg/dL 8.9  8.9  8.6   Total Protein 6.5 - 8.1 g/dL   6.4   Total Bilirubin 0.3 - 1.2 mg/dL    0.4   Alkaline Phos 38 - 126 U/L   50   AST 15 - 41 U/L   11   ALT 0 - 44 U/L   11    Lipid Panel     Component Value Date/Time   CHOL 138 05/08/2022 0539   TRIG 347 (H) 05/08/2022 0539   HDL 30 (L) 05/08/2022 0539   CHOLHDL 4.6 05/08/2022 0539   VLDL 69 (H) 05/08/2022 0539   LDLCALC 39 05/08/2022 0539    CBC    Component Value Date/Time   WBC 8.5 06/13/2022 0105   RBC 4.06 06/13/2022 0105   HGB 11.3 (L) 06/13/2022 0105   HCT 34.8 (L) 06/13/2022 0105   PLT 302 06/13/2022 0105   MCV 85.7 06/13/2022 0105   MCH 27.8 06/13/2022 0105   MCHC 32.5 06/13/2022 0105   RDW 12.9 06/13/2022 0105   LYMPHSABS 2.9 05/07/2022 0521   MONOABS 0.7 05/07/2022 0521   EOSABS 0.1 05/07/2022 0521   BASOSABS 0.0 05/07/2022 0521    ASSESSMENT AND PLAN:  There are no diagnoses linked to this encounter.   Patient was given the opportunity to ask questions.  Patient verbalized understanding of the plan and was able to repeat key elements of the plan. Patient was given clear instructions to go to Emergency Department or return to medical center if symptoms don't improve, worsen, or new problems develop.The patient verbalized understanding.   No orders of the defined types were placed in this encounter.    Requested Prescriptions    No prescriptions requested or ordered in this encounter    No follow-ups on file.  Rema Fendt, NP

## 2022-07-15 ENCOUNTER — Ambulatory Visit (INDEPENDENT_AMBULATORY_CARE_PROVIDER_SITE_OTHER): Payer: No Typology Code available for payment source | Admitting: Family

## 2022-07-15 ENCOUNTER — Encounter: Payer: Self-pay | Admitting: Family

## 2022-07-15 ENCOUNTER — Telehealth: Payer: Self-pay | Admitting: Family

## 2022-07-15 VITALS — BP 117/84 | HR 90 | Temp 97.9°F | Ht 62.5 in | Wt 185.8 lb

## 2022-07-15 DIAGNOSIS — Z794 Long term (current) use of insulin: Secondary | ICD-10-CM

## 2022-07-15 DIAGNOSIS — E1165 Type 2 diabetes mellitus with hyperglycemia: Secondary | ICD-10-CM

## 2022-07-15 DIAGNOSIS — Z0100 Encounter for examination of eyes and vision without abnormal findings: Secondary | ICD-10-CM

## 2022-07-15 DIAGNOSIS — I10 Essential (primary) hypertension: Secondary | ICD-10-CM

## 2022-07-15 DIAGNOSIS — L84 Corns and callosities: Secondary | ICD-10-CM

## 2022-07-15 DIAGNOSIS — E119 Type 2 diabetes mellitus without complications: Secondary | ICD-10-CM | POA: Diagnosis not present

## 2022-07-15 MED ORDER — PEN NEEDLES 3/16" 31G X 5 MM MISC
1.0000 | Freq: Every day | 0 refills | Status: DC
Start: 2022-07-15 — End: 2022-11-09

## 2022-07-15 MED ORDER — LISINOPRIL 40 MG PO TABS: 40.0000 mg | ORAL_TABLET | Freq: Every day | ORAL | 0 refills | Status: DC

## 2022-07-15 MED ORDER — INSULIN GLARGINE-YFGN 100 UNIT/ML ~~LOC~~ SOPN
28.0000 [IU] | PEN_INJECTOR | Freq: Every day | SUBCUTANEOUS | 1 refills | Status: DC
Start: 2022-07-15 — End: 2022-08-26

## 2022-07-15 MED ORDER — METFORMIN HCL ER 500 MG PO TB24
ORAL_TABLET | ORAL | 2 refills | Status: AC
Start: 2022-07-15 — End: ?

## 2022-07-15 NOTE — Telephone Encounter (Signed)
Pt stated it is cheaper if she receives a  90 day prescription. Pt requests that a 90 day supply for insulin glargine-yfgn (SEMGLEE) 100 UNIT/ML Pen be sent to  [id number] [place] ([place]),  - [address] Phone: [phone]  Fax: [phone]

## 2022-07-16 LAB — HEMOGLOBIN A1C
Est. average glucose Bld gHb Est-mCnc: 200 mg/dL
Hgb A1c MFr Bld: 8.6 % — ABNORMAL HIGH (ref 4.8–5.6)

## 2022-07-16 LAB — BASIC METABOLIC PANEL
BUN/Creatinine Ratio: 23 (ref 9–23)
BUN: 11 mg/dL (ref 6–24)
CO2: 22 mmol/L (ref 20–29)
Calcium: 9.9 mg/dL (ref 8.7–10.2)
Chloride: 102 mmol/L (ref 96–106)
Creatinine, Ser: 0.48 mg/dL — ABNORMAL LOW (ref 0.57–1.00)
Glucose: 211 mg/dL — ABNORMAL HIGH (ref 70–99)
Potassium: 4.7 mmol/L (ref 3.5–5.2)
Sodium: 138 mmol/L (ref 134–144)
eGFR: 120 mL/min/{1.73_m2} (ref >59)

## 2022-07-18 LAB — MICROALBUMIN / CREATININE URINE RATIO
Creatinine, Urine: 92.2 mg/dL
Microalb/Creat Ratio: 68 mg/g creat — ABNORMAL HIGH (ref 0–29)
Microalbumin, Urine: 62.7 ug/mL

## 2022-07-20 NOTE — Progress Notes (Signed)
POST OPERATIVE OFFICE NOTE    CC:  F/u for surgery  HPI:  [name] who is s/p right femoral-popliteal embolectomy, 4 compartment fasciotomy on 05/07/2022 by Dr. Karin Lieu.  She underwent right groin exploration with evacuation hematoma on 05/13/2022 by Dr. Myra Gianotti.  On 06/10/2022, she underwent right groin washout, soft tissue debridement, antibiotic bead placement, myriad morecell powder and vac placement by Dr. Karin Lieu.  She was seen on 07/03/2022 and at that time, she had pain in groin that she attributed to raw and irritated peri-incisional skin from frequent vac changes three times per week.  She was compliant with her asa/Xarelto.  She did have some yeast like skin lesions and she was changed to wet to dry dressing changes and scheduled for a 2-3 weeks follow up for wound check.    Pt returns today for follow up and here with her mom.  Pt states her wound is getting smaller.  She is doing well.  She still has some tingling on the right foot.  No issues with the left foot.     Allergies  Allergen Reactions   Sulfa Antibiotics Hives    Current Outpatient Medications  Medication Sig Dispense Refill   acetaminophen (TYLENOL) 500 MG tablet Take 500-1,000 mg by mouth every 6 (six) hours as needed (pain.). (Patient not taking: Reported on 07/15/2022)     aspirin EC 81 MG tablet Take 1 tablet (81 mg total) by mouth daily at 6 (six) AM. Swallow whole. (Patient taking differently: Take 81 mg by mouth daily at 6 (six) AM.) 30 tablet 12   Blood Glucose Monitoring Suppl (ACCU-CHEK GUIDE) w/Device KIT 1 each by Other route 4 (four) times daily -  before meals and at bedtime. 1 kit 0   ciprofloxacin (CIPRO) 500 MG tablet Take 1 tablet (500 mg total) by mouth 2 (two) times daily. 8 tablet 0   fluconazole (DIFLUCAN) 150 MG tablet Take 1 tablet (150 mg total) by mouth daily. 5 tablet 0   gabapentin (NEURONTIN) 300 MG capsule TAKE 1 CAPSULE BY MOUTH THREE TIMES DAILY 270 capsule 0   glucose blood  (ACCU-CHEK GUIDE) test strip Use as instructed 100 each 12   insulin glargine-yfgn (SEMGLEE) 100 UNIT/ML Pen Inject 28 Units into the skin at bedtime. 15 mL 1   Insulin Pen Needle (PEN NEEDLES 3/16") 31G X 5 MM MISC 1 each by Other route daily. Use to inject insulin 100 each 0   Lancets (ACCU-CHEK MULTICLIX) lancets Use as instructed 100 each 12   lisinopril (ZESTRIL) 40 MG tablet Take 1 tablet (40 mg total) by mouth daily. 90 tablet 0   metFORMIN (GLUCOPHAGE-XR) 500 MG 24 hr tablet TAKE 2 TABLETS BY MOUTH TWICE DAILY WITH A MEAL 120 tablet 2   oxyCODONE-acetaminophen (PERCOCET/ROXICET) 5-325 MG tablet Take 1 tablet by mouth every 6 (six) hours as needed for moderate pain. (Patient not taking: Reported on 07/15/2022) 30 tablet 0   rivaroxaban (XARELTO) 20 MG TABS tablet Take 1 tablet (20 mg total) by mouth daily. (Patient taking differently: Take 20 mg by mouth daily with supper.) 30 tablet 3   rosuvastatin (CRESTOR) 20 MG tablet Take 1 tablet (20 mg total) by mouth daily. (Patient taking differently: Take 20 mg by mouth every evening.) 30 tablet 11   No current facility-administered medications for this visit.     ROS:  See HPI  Physical Exam:  Today's Vitals   07/24/22 0821  BP: 122/88  Pulse: 90  Temp:  98.2 F (36.8 C)  TempSrc: Temporal  SpO2: 96%  Weight: 186 lb (84.4 kg)   Body mass index is 33.48 kg/m.   Incision:  fasciotomy incisions have healed.  Right groin wound significantly smaller with excellent granulation tissue  Extremities:  pt with palpable right DP pulse     Assessment/Plan:  [name] who is s/p: right femoral-popliteal embolectomy, 4 compartment fasciotomy on 05/07/2022 by Dr. Karin Lieu.  She underwent right groin exploration with evacuation hematoma on 05/13/2022 by Dr. Myra Gianotti.  On 06/10/2022, she underwent right groin washout, soft tissue debridement, antibiotic bead placement, myriad morecell powder and vac placement by Dr. Karin Lieu.  -pt's  right groin wound significantly smaller than last visit. She will continue wet to dry dressing changes and anticipate this should be healed in a couple of weeks.  -ok for pt to return to work on 08/06/2022.   -will have her return in 4 weeks with ABI and RLE arterial duplex since her initial surgery was 3 months ago.  She would like to see Dr. Karin Lieu when she returns.   -continue asa/Xarelto and statin  Doreatha Massed, [facility] [phone]   Clinic MD:  Karin Lieu

## 2022-07-22 ENCOUNTER — Other Ambulatory Visit: Payer: Self-pay

## 2022-07-22 DIAGNOSIS — E1165 Type 2 diabetes mellitus with hyperglycemia: Secondary | ICD-10-CM

## 2022-07-22 NOTE — Telephone Encounter (Signed)
completed

## 2022-07-24 ENCOUNTER — Encounter: Payer: Self-pay | Admitting: Physician Assistant

## 2022-07-24 ENCOUNTER — Ambulatory Visit (INDEPENDENT_AMBULATORY_CARE_PROVIDER_SITE_OTHER): Payer: No Typology Code available for payment source | Admitting: Physician Assistant

## 2022-07-24 ENCOUNTER — Other Ambulatory Visit: Payer: Self-pay | Admitting: *Deleted

## 2022-07-24 VITALS — BP 122/88 | HR 90 | Temp 98.2°F | Wt 186.0 lb

## 2022-07-24 DIAGNOSIS — Z9889 Other specified postprocedural states: Secondary | ICD-10-CM

## 2022-07-24 DIAGNOSIS — T8142XA Infection following a procedure, deep incisional surgical site, initial encounter: Secondary | ICD-10-CM

## 2022-07-30 ENCOUNTER — Other Ambulatory Visit: Payer: Self-pay

## 2022-07-30 MED ORDER — RIVAROXABAN 20 MG PO TABS: 20.0000 mg | ORAL_TABLET | Freq: Every day | ORAL | 3 refills | Status: AC

## 2022-08-03 NOTE — Progress Notes (Unsigned)
Cardiology Office Note:    Date:  08/05/2022   ID:  [name] [name], DOB [birth year], MRN [id number]  PCP:  [facility]   [facility] Providers Cardiologist:  Maisie Fus, MD     Referring MD: [name], MD   No chief complaint on file. Work up for cardioembolic source of  right superficial femoraly artery is occluded  History of Present Illness:    [name] [name] is a 42 y.o. female with a hx of GERD, depression, HTN, obesity , s/p right femoral-popliteal embolectomy, 4 compartment fasciotomy and prevena placement on 05/07/2022 by Dr. Karin Lieu.  She was referred to cardiology for a holter monitor to determine is she had a cardioembolic source. TTE showed normal EF, no significant valvular issues. Can have palpitations with stress. No c/f OSA.  TSH was normal in 2020.    Past Medical History:  Diagnosis Date   Anxiety    Back fracture    Bipolar disorder (HCC)    Depression    Diabetes mellitus without complication (HCC)    Family history of adverse reaction to anesthesia    sister gets nausea and vomitting and itching on her face. Around her nose and mouth.   Fatty liver disease, nonalcoholic    GERD (gastroesophageal reflux disease)    H/O cesarean section complicating pregnancy 04/18/2014   Hypertension    Neuromuscular disorder (HCC)    eye twitch on left eye    Obesity    Pelvis fracture (HCC)    Pneumonia 2021   S/P cesarean section 06/01/2014   S/P tubal ligation 06/01/2014    Past Surgical History:  Procedure Laterality Date   APPLICATION OF WOUND VAC Right 05/13/2022   Procedure: APPLICATION OF PREVENA WOUND VAC;  Surgeon: Nada Libman, MD;  Location: [facility] OR;  Service: Vascular;  Laterality: Right;   APPLICATION OF WOUND VAC Right 06/10/2022   Procedure: APPLICATION OF WOUND VAC;  Surgeon: [name], MD;  Location: [facility] OR;  Service: Vascular;  Laterality: Right;   CESAREAN SECTION     CESAREAN SECTION WITH  BILATERAL TUBAL LIGATION Bilateral 06/01/2014   Procedure: CESAREAN SECTION WITH BILATERAL TUBAL LIGATION;  Surgeon: Sherian Rein, MD;  Location: [facility] ORS;  Service: Obstetrics;  Laterality: Bilateral;  MD requests RNFA   CHEST TUBE INSERTION     FEMORAL ARTERY EXPLORATION Right 05/13/2022   Procedure: RIGHTGROIN EXPLORATION , HEMATOMA EVACUATION;  Surgeon: Nada Libman, MD;  Location: [facility] OR;  Service: Vascular;  Laterality: Right;   FEMORAL-POPLITEAL BYPASS GRAFT Right 05/07/2022   Procedure: RIGHT FEMORAL-POPLITEAL ARTERY EMBOLECTOMY AND 4  COMPARMENT FASCIOTOMY;  Surgeon: [name], MD;  Location: [facility] OR;  Service: Vascular;  Laterality: Right;   TONSILLECTOMY     TRACHEOSTOMY     WOUND EXPLORATION Right 06/10/2022   Procedure: Reola Calkins AND DEBRIDMENT OF RIGHT GROIN;  Surgeon: [name], MD;  Location: [facility] OR;  Service: Vascular;  Laterality: Right;    Current Medications: Current Meds  Medication Sig   acetaminophen (TYLENOL) 500 MG tablet Take 500-1,000 mg by mouth every 6 (six) hours as needed (pain.).   aspirin EC 81 MG tablet Take 1 tablet (81 mg total) by mouth daily at 6 (six) AM. Swallow whole. (Patient taking differently: Take 81 mg by mouth daily at 6 (six) AM.)   Blood Glucose Monitoring Suppl (ACCU-CHEK GUIDE) w/Device KIT 1 each by Other route 4 (four) times daily -  before meals and at  bedtime.   gabapentin (NEURONTIN) 300 MG capsule TAKE 1 CAPSULE BY MOUTH THREE TIMES DAILY   glucose blood (ACCU-CHEK GUIDE) test strip Use as instructed   insulin glargine-yfgn (SEMGLEE) 100 UNIT/ML Pen Inject 28 Units into the skin at bedtime.   Insulin Pen Needle (PEN NEEDLES 3/16") 31G X 5 MM MISC 1 each by Other route daily. Use to inject insulin   Lancets (ACCU-CHEK MULTICLIX) lancets Use as instructed   lisinopril (ZESTRIL) 40 MG tablet Take 1 tablet (40 mg total) by mouth daily.   metFORMIN (GLUCOPHAGE-XR) 500 MG 24 hr tablet TAKE 2 TABLETS BY MOUTH TWICE DAILY WITH A  MEAL   rivaroxaban (XARELTO) 20 MG TABS tablet Take 1 tablet (20 mg total) by mouth daily with supper.   rosuvastatin (CRESTOR) 20 MG tablet Take 1 tablet (20 mg total) by mouth daily. (Patient taking differently: Take 20 mg by mouth every evening.)     Allergies:   Sulfa antibiotics   Social History   Socioeconomic History   Marital status: Legally Separated    Spouse name: Not on file   Number of children: Not on file   Years of education: Not on file   Highest education level: GED or equivalent  Occupational History   Not on file  Tobacco Use   Smoking status: Former    Current packs/day: 0.00    Average packs/day: 1 pack/day for 12.0 years (12.0 ttl pk-yrs)    Types: Cigarettes    Start date: 06/09/2010    Quit date: 06/09/2022    Years since quitting: 0.1    Passive exposure: Current   Smokeless tobacco: Never  Vaping Use   Vaping status: Never Used  Substance and Sexual Activity   Alcohol use: No   Drug use: No   Sexual activity: Yes    Birth control/protection: None  Other Topics Concern   Not on file  Social History Narrative   Not on file   Social Determinants of Health   Financial Resource Strain: Medium Risk (07/11/2022)   Overall Financial Resource Strain (CARDIA)    Difficulty of Paying Living Expenses: Somewhat hard  Food Insecurity: No Food Insecurity (07/11/2022)   Hunger Vital Sign    Worried About Running Out of Food in the Last Year: Never true    Ran Out of Food in the Last Year: Never true  Transportation Needs: No Transportation Needs (07/11/2022)   PRAPARE - Administrator, Civil Service (Medical): No    Lack of Transportation (Non-Medical): No  Physical Activity: Unknown (07/11/2022)   Exercise Vital Sign    Days of Exercise per Week: 0 days    Minutes of Exercise per Session: Not on file  Stress: Stress Concern Present (07/11/2022)   [employer] of Occupational Health - Occupational Stress Questionnaire    Feeling of Stress  : To some extent  Social Connections: Moderately Isolated (07/11/2022)   Social Connection and Isolation Panel [NHANES]    Frequency of Communication with Friends and Family: More than three times a week    Frequency of Social Gatherings with Friends and Family: Patient declined    Attends Religious Services: 1 to 4 times per year    Active Member of [employer] or Organizations: No    Attends Engineer, structural: Not on file    Marital Status: Separated     Family History: The patient's family history includes Diabetes in her maternal grandmother and paternal grandmother.  ROS:   Please see the  history of present illness.     All other systems reviewed and are negative.  EKGs/Labs/Other Studies Reviewed:    The following studies were reviewed today:  EKG Interpretation Date/Time:  Tuesday August 04 2022 14:02:16 EDT Ventricular Rate:  101 PR Interval:  144 QRS Duration:  68 QT Interval:  360 QTC Calculation: 466 R Axis:   40  Text Interpretation: Sinus tachycardia When compared with ECG of 07-May-2022 16:16, Vent. rate has increased BY  33 BPM QT has lengthened Confirmed by Carolan Clines [phone]) on 08/04/2022 2:10:10 PM   EKG Interpretation Date/Time:  Tuesday August 04 2022 14:02:16 EDT Ventricular Rate:  101 PR Interval:  144 QRS Duration:  68 QT Interval:  360 QTC Calculation: 466 R Axis:   40  Text Interpretation: Sinus tachycardia When compared with ECG of 07-May-2022 16:16, Vent. rate has increased BY  33 BPM QT has lengthened Confirmed by Carolan Clines [id number]) on 08/04/2022 2:10:10 PM    Recent Labs: 06/10/2022: ALT 11 06/13/2022: Hemoglobin 11.3; Platelets 302 07/15/2022: BUN 11; Creatinine, Ser 0.48; Potassium 4.7; Sodium 138  Recent Lipid Panel    Component Value Date/Time   CHOL 138 05/08/2022 0539   TRIG 347 (H) 05/08/2022 0539   HDL 30 (L) 05/08/2022 0539   CHOLHDL 4.6 05/08/2022 0539   VLDL 69 (H) 05/08/2022 0539   LDLCALC 39 05/08/2022 0539     Risk  Assessment/Calculations:     Physical Exam:    VS:  Vitals:   08/04/22 1359  BP: 122/72  Pulse: (!) 101  SpO2: 99%     BP 122/72   Pulse (!) 101   Ht 5' 2.5" (1.588 m)   Wt 187 lb (84.8 kg)   LMP 07/08/2022 (Approximate)   SpO2 99%   BMI 33.66 kg/m     Wt Readings from Last 3 Encounters:  08/04/22 187 lb (84.8 kg)  07/24/22 186 lb (84.4 kg)  07/15/22 185 lb 12.8 oz (84.3 kg)     GEN:  Well nourished, well developed in no acute distress HEENT: Normal NECK: No JVD; No carotid bruits LYMPHATICS: No lymphadenopathy CARDIAC: RRR, no murmurs, rubs, gallops RESPIRATORY:  Clear to auscultation without rales, wheezing or rhonchi  ABDOMEN: Soft, non-tender, non-distended MUSCULOSKELETAL:  No edema; No deformity  SKIN: Warm and dry NEUROLOGIC:  Alert and oriented x 3 PSYCHIATRIC:  Normal affect   ASSESSMENT and PLAN   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  Concerns regarding medicines are outlined above.  Orders Placed This Encounter  Procedures   EKG 12-Lead   No orders of the defined types were placed in this encounter.   Patient Instructions  Medication Instructions:  Your physician recommends that you continue on your current medications as directed. Please refer to the Current Medication list given to you today.  *If you need a refill on your cardiac medications before your next appointment, please call your pharmacy*   Lab Work: None   Testing/Procedures: Your physician has recommended that you wear an event monitor. Event monitors are medical devices that record the heart's electrical activity. Doctors most often [place] these monitors to  diagnose arrhythmias. Arrhythmias are problems with the speed  or rhythm of the heartbeat. The monitor is a small, portable device. You can wear one while you do your normal daily activities. This is usually used to diagnose what is causing palpitations/syncope (passing out).   Follow-Up: At [facility], you and your health needs are our priority.  As part of our continuing mission to provide you with exceptional heart care, we have created designated Provider Care Teams.  These Care Teams include your primary Cardiologist (physician) and Advanced Practice Providers (APPs -  Physician Assistants and Nurse Practitioners) who all work together to provide you with the care you need, when you need it.   Your next appointment:    As needed  Provider:   Maisie Fus, MD    Signed, Maisie Fus, MD  08/05/2022 9:01 AM    [facility]

## 2022-08-04 ENCOUNTER — Other Ambulatory Visit: Payer: Self-pay | Admitting: Internal Medicine

## 2022-08-04 ENCOUNTER — Ambulatory Visit: Payer: No Typology Code available for payment source | Attending: Internal Medicine | Admitting: Internal Medicine

## 2022-08-04 VITALS — BP 122/72 | HR 101 | Ht 62.5 in | Wt 187.0 lb

## 2022-08-04 DIAGNOSIS — I8290 Acute embolism and thrombosis of unspecified vein: Secondary | ICD-10-CM | POA: Diagnosis not present

## 2022-08-04 DIAGNOSIS — I10 Essential (primary) hypertension: Secondary | ICD-10-CM | POA: Diagnosis not present

## 2022-08-04 DIAGNOSIS — R002 Palpitations: Secondary | ICD-10-CM

## 2022-08-04 DIAGNOSIS — R000 Tachycardia, unspecified: Secondary | ICD-10-CM

## 2022-08-04 NOTE — Patient Instructions (Addendum)
Medication Instructions:  Your physician recommends that you continue on your current medications as directed. Please refer to the Current Medication list given to you today.  *If you need a refill on your cardiac medications before your next appointment, please call your pharmacy*   Lab Work: None   Testing/Procedures: Your physician has recommended that you wear an event monitor. Event monitors are medical devices that record the heart's electrical activity. Doctors most often [place] these monitors to diagnose arrhythmias. Arrhythmias are problems with the speed or rhythm of the heartbeat. The monitor is a small, portable device. You can wear one while you do your normal daily activities. This is usually used to diagnose what is causing palpitations/syncope (passing out).   Follow-Up: At [facility], you and your health needs are our priority.  As part of our continuing mission to provide you with exceptional heart care, we have created designated Provider Care Teams.  These Care Teams include your primary Cardiologist (physician) and Advanced Practice Providers (APPs -  Physician Assistants and Nurse Practitioners) who all work together to provide you with the care you need, when you need it.   Your next appointment:    As needed  Provider:   Maisie Fus, MD

## 2022-08-07 DIAGNOSIS — R000 Tachycardia, unspecified: Secondary | ICD-10-CM | POA: Diagnosis not present

## 2022-08-07 DIAGNOSIS — I8290 Acute embolism and thrombosis of unspecified vein: Secondary | ICD-10-CM | POA: Diagnosis not present

## 2022-08-07 DIAGNOSIS — R002 Palpitations: Secondary | ICD-10-CM

## 2022-08-13 ENCOUNTER — Ambulatory Visit (HOSPITAL_COMMUNITY)
Admission: RE | Admit: 2022-08-13 | Discharge: 2022-08-13 | Disposition: A | Discharge status: Home / Self Care | Payer: No Typology Code available for payment source | Source: Ambulatory Visit | Attending: Vascular Surgery | Admitting: Vascular Surgery

## 2022-08-13 ENCOUNTER — Ambulatory Visit (INDEPENDENT_AMBULATORY_CARE_PROVIDER_SITE_OTHER)
Admission: RE | Admit: 2022-08-13 | Discharge: 2022-08-13 | Discharge status: Home / Self Care | Payer: No Typology Code available for payment source | Source: Ambulatory Visit | Attending: Vascular Surgery

## 2022-08-13 DIAGNOSIS — Z9889 Other specified postprocedural states: Secondary | ICD-10-CM

## 2022-08-13 LAB — VAS US ABI WITH/WO TBI
Left ABI: 1.24
Right ABI: 1.26

## 2022-08-20 NOTE — Progress Notes (Signed)
POST OPERATIVE OFFICE NOTE    CC:  F/u for surgery  HPI:  [name] who is s/p right femoral-popliteal embolectomy, 4 compartment fasciotomy on 05/07/2022 by Dr. Karin Lieu.  She underwent right groin exploration with evacuation hematoma on 05/13/2022 by Dr. Myra Gianotti.  On 06/10/2022, she underwent right groin washout, soft tissue debridement, antibiotic bead placement, myriad morecell powder and vac placement by Dr. Karin Lieu.  She was seen on 07/03/2022 and at that time, she had pain in groin that she attributed to raw and irritated peri-incisional skin from frequent vac changes three times per week.  She was compliant with her asa/Xarelto.  She did have some yeast like skin lesions and she was changed to wet to dry dressing changes and scheduled for a 2-3 weeks follow up for wound check.    Pt returns today for follow up. Pt states her wound is getting smaller.  She is doing well.  She still has some tingling on the right foot.  No issues with the left foot. Some continued RLE swelling.. No wearing compressions.   Allergies  Allergen Reactions   Sulfa Antibiotics Hives    Current Outpatient Medications  Medication Sig Dispense Refill   acetaminophen (TYLENOL) 500 MG tablet Take 500-1,000 mg by mouth every 6 (six) hours as needed (pain.).     aspirin EC 81 MG tablet Take 1 tablet (81 mg total) by mouth daily at 6 (six) AM. Swallow whole. (Patient taking differently: Take 81 mg by mouth daily at 6 (six) AM.) 30 tablet 12   Blood Glucose Monitoring Suppl (ACCU-CHEK GUIDE) w/Device KIT 1 each by Other route 4 (four) times daily -  before meals and at bedtime. 1 kit 0   gabapentin (NEURONTIN) 300 MG capsule TAKE 1 CAPSULE BY MOUTH THREE TIMES DAILY 270 capsule 0   glucose blood (ACCU-CHEK GUIDE) test strip Use as instructed 100 each 12   insulin glargine-yfgn (SEMGLEE) 100 UNIT/ML Pen Inject 28 Units into the skin at bedtime. 15 mL 1   Insulin Pen Needle (PEN NEEDLES 3/16") 31G X 5 MM MISC  1 each by Other route daily. Use to inject insulin 100 each 0   Lancets (ACCU-CHEK MULTICLIX) lancets Use as instructed 100 each 12   lisinopril (ZESTRIL) 40 MG tablet Take 1 tablet (40 mg total) by mouth daily. 90 tablet 0   metFORMIN (GLUCOPHAGE-XR) 500 MG 24 hr tablet TAKE 2 TABLETS BY MOUTH TWICE DAILY WITH A MEAL 120 tablet 2   rivaroxaban (XARELTO) 20 MG TABS tablet Take 1 tablet (20 mg total) by mouth daily with supper. 90 tablet 3   rosuvastatin (CRESTOR) 20 MG tablet Take 1 tablet (20 mg total) by mouth daily. (Patient taking differently: Take 20 mg by mouth every evening.) 30 tablet 11   No current facility-administered medications for this visit.     ROS:  See HPI  Physical Exam:  There were no vitals filed for this visit.  There is no height or weight on file to calculate BMI.   Incision:  fasciotomy incisions have healed.  Right groin wound significantly smaller with excellent granulation tissue. Extremities:  pt with palpable right DP pulse  Imaging:   ABI Findings:  +---------+------------------+-----+---------+--------+  Right   Rt Pressure (mmHg)IndexWaveform Comment   +---------+------------------+-----+---------+--------+  Brachial 131                                       +---------+------------------+-----+---------+--------+  PTA     165               1.26 triphasic          +---------+------------------+-----+---------+--------+  DP      151               1.15 triphasic          +---------+------------------+-----+---------+--------+  Great Toe136               1.04 Normal             +---------+------------------+-----+---------+--------+   +---------+------------------+-----+---------+-------+  Left    Lt Pressure (mmHg)IndexWaveform Comment  +---------+------------------+-----+---------+-------+  Brachial 127                                      +---------+------------------+-----+---------+-------+  PTA      160               1.22 triphasic         +---------+------------------+-----+---------+-------+  DP      163               1.24 triphasic         +---------+------------------+-----+---------+-------+  Great Toe93                0.71 Normal            +---------+------------------+-----+---------+-------+   +-------+-----------+-----------+------------+------------+  ABI/TBIToday's ABIToday's TBIPrevious ABIPrevious TBI  +-------+-----------+-----------+------------+------------+  Right 1.26       1.04                                 +-------+-----------+-----------+------------+------------+  Left  1.24       0.71                                 +-------+-----------+-----------+------------+------------+   +-----------+--------+-----+--------+---------+--------+  RIGHT     PSV cm/sRatioStenosisWaveform Comments  +-----------+--------+-----+--------+---------+--------+  CFA Prox   100                  triphasic          +-----------+--------+-----+--------+---------+--------+  CFA Distal 85                   triphasic          +-----------+--------+-----+--------+---------+--------+  DFA       72                   triphasic          +-----------+--------+-----+--------+---------+--------+  SFA Prox   111                  triphasic          +-----------+--------+-----+--------+---------+--------+  SFA Mid    126                  triphasic          +-----------+--------+-----+--------+---------+--------+  SFA Distal 129                  triphasic          +-----------+--------+-----+--------+---------+--------+  POP Prox   57                   triphasic          +-----------+--------+-----+--------+---------+--------+  POP Mid    39                   triphasic          +-----------+--------+-----+--------+---------+--------+  POP Distal 79                   triphasic           +-----------+--------+-----+--------+---------+--------+  TP Trunk   80                   triphasic          +-----------+--------+-----+--------+---------+--------+  ATA Distal 40                   triphasic          +-----------+--------+-----+--------+---------+--------+  PTA Distal 79                   triphasic          +-----------+--------+-----+--------+---------+--------+  PERO Distal24                   triphasic          +-----------+--------+-----+--------+---------+--------+    Assessment/Plan:  [name] who is s/p: right femoral-popliteal embolectomy, 4 compartment fasciotomy on 05/07/2022 by Dr. Karin Lieu.  She underwent right groin exploration with evacuation hematoma on 05/13/2022 by Dr. Myra Gianotti.  On 06/10/2022, she underwent right groin washout, soft tissue debridement, antibiotic bead placement, myriad morecell powder and vac placement by Dr. Karin Lieu.  Overall very happy with Nyssa's progress.  Her right groin wound is nearly-healed.  Imaging today demonstrated triphasic waveforms throughout the right lower extremity. She has returned to work and is doing well.  I asked that she continue her current medication regimen. She was asked to call my office should any new questions or concerns arise.    Will see her in one month for wound check. Once she is completely healed she can follow up PRN. -continue asa/Xarelto and statin  [name] MD [facility] [phone]

## 2022-08-21 ENCOUNTER — Ambulatory Visit (INDEPENDENT_AMBULATORY_CARE_PROVIDER_SITE_OTHER): Payer: No Typology Code available for payment source | Admitting: Vascular Surgery

## 2022-08-21 ENCOUNTER — Encounter: Payer: Self-pay | Admitting: Vascular Surgery

## 2022-08-21 VITALS — BP 124/86 | HR 85 | Temp 98.2°F | Resp 20 | Ht 62.0 in | Wt 187.0 lb

## 2022-08-21 DIAGNOSIS — T8149XA Infection following a procedure, other surgical site, initial encounter: Secondary | ICD-10-CM | POA: Diagnosis not present

## 2022-08-21 DIAGNOSIS — Z9889 Other specified postprocedural states: Secondary | ICD-10-CM | POA: Diagnosis not present

## 2022-08-25 ENCOUNTER — Telehealth: Payer: Self-pay

## 2022-08-25 NOTE — Telephone Encounter (Signed)
Good Morning Tresa Endo, can you help me with this Pa for this patient?  Semglee 100U/ML Pen Inj

## 2022-08-26 ENCOUNTER — Other Ambulatory Visit: Payer: Self-pay

## 2022-08-26 ENCOUNTER — Other Ambulatory Visit: Payer: Self-pay | Admitting: Family

## 2022-08-26 DIAGNOSIS — E1165 Type 2 diabetes mellitus with hyperglycemia: Secondary | ICD-10-CM

## 2022-08-26 MED ORDER — INSULIN GLARGINE-YFGN 100 UNIT/ML ~~LOC~~ SOPN
28.0000 [IU] | PEN_INJECTOR | Freq: Every day | SUBCUTANEOUS | 0 refills | Status: DC
Start: 2022-08-26 — End: 2022-11-09

## 2022-08-26 NOTE — Telephone Encounter (Signed)
Complete

## 2022-08-26 NOTE — Telephone Encounter (Signed)
Forwarded to Amy.  ?

## 2022-10-02 ENCOUNTER — Other Ambulatory Visit: Payer: Self-pay | Admitting: Family

## 2022-10-02 DIAGNOSIS — Z794 Long term (current) use of insulin: Secondary | ICD-10-CM

## 2022-10-05 ENCOUNTER — Ambulatory Visit: Payer: No Typology Code available for payment source | Attending: Internal Medicine

## 2022-10-05 DIAGNOSIS — R002 Palpitations: Secondary | ICD-10-CM

## 2022-10-05 DIAGNOSIS — R000 Tachycardia, unspecified: Secondary | ICD-10-CM

## 2022-10-05 DIAGNOSIS — I8290 Acute embolism and thrombosis of unspecified vein: Secondary | ICD-10-CM

## 2022-10-09 ENCOUNTER — Ambulatory Visit: Payer: No Typology Code available for payment source | Admitting: Vascular Surgery

## 2022-10-15 ENCOUNTER — Encounter: Payer: Self-pay | Admitting: Family

## 2022-10-15 ENCOUNTER — Ambulatory Visit (INDEPENDENT_AMBULATORY_CARE_PROVIDER_SITE_OTHER): Payer: No Typology Code available for payment source | Admitting: Family

## 2022-10-15 VITALS — BP 124/84 | HR 81 | Temp 98.0°F | Ht 62.0 in | Wt 181.6 lb

## 2022-10-15 DIAGNOSIS — F411 Generalized anxiety disorder: Secondary | ICD-10-CM

## 2022-10-15 DIAGNOSIS — E1165 Type 2 diabetes mellitus with hyperglycemia: Secondary | ICD-10-CM | POA: Diagnosis not present

## 2022-10-15 DIAGNOSIS — Z794 Long term (current) use of insulin: Secondary | ICD-10-CM | POA: Diagnosis not present

## 2022-10-15 MED ORDER — METFORMIN HCL ER 500 MG PO TB24
ORAL_TABLET | ORAL | 2 refills | Status: DC
Start: 2022-10-15 — End: 2022-11-09

## 2022-10-15 MED ORDER — FLUOXETINE HCL 10 MG PO TABS
10.0000 mg | ORAL_TABLET | Freq: Every day | ORAL | 1 refills | Status: DC
Start: 2022-10-15 — End: 2022-11-25

## 2022-10-15 NOTE — Progress Notes (Signed)
Patient ID: [name] [name], female    DOB: 07-01-1980  MRN: [id number]  CC: Chronic Conditions Follow-Up  Subjective: [name] [name] is a 42 y.o. female who presents for chronic conditions follow-up.   Her concerns today include:  - Doing well on Metformin and Insulin Glargine-YFGN, no issues/concerns. She does not check blood sugars at home. She is aware of upcoming appointment with Endocrinology.  - Reports stress due to work-life balance. Reports she is established with a therapist through her employer [employer]. She would like to trial anxiety medication. She denies thoughts of self-harm, suicidal ideations, homicidal ideations.   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   Hypertensive retinopathy of both eyes 11/08/2018   Nuclear age-related cataract, both eyes 11/08/2018   MDD (major depressive disorder), recurrent episode, moderate (HCC) 08/04/2018   PTSD (post-traumatic stress disorder) 08/04/2018   MDD (major depressive disorder), severe (HCC) 07/12/2018   GERD (gastroesophageal reflux disease) 02/17/2018   Type 2 diabetes mellitus without complication, without long-term current use of insulin (HCC) 02/17/2018   S/P cesarean section 06/01/2014   S/P tubal ligation 06/01/2014   H/O cesarean section complicating pregnancy 04/18/2014   Emotional disorder (HCC) 03/16/2013   Tobacco user 03/16/2013   Acute chest pain 03/01/2013   HTN (hypertension) 09/28/2012   DM (diabetes mellitus) (HCC) 11/06/2011     Current Outpatient Medications on File Prior to Visit  Medication Sig Dispense Refill   acetaminophen (TYLENOL) 500 MG tablet Take 500-1,000 mg by mouth every 6 (six) hours as needed (pain.).     Blood Glucose Monitoring Suppl (ACCU-CHEK GUIDE) w/Device KIT 1 each by Other route 4 (four) times daily -   before meals and at bedtime. 1 kit 0   gabapentin (NEURONTIN) 300 MG capsule TAKE 1 CAPSULE BY MOUTH THREE TIMES DAILY 270 capsule 0   glucose blood (ACCU-CHEK GUIDE) test strip Use as instructed 100 each 12   insulin glargine-yfgn (SEMGLEE) 100 UNIT/ML Pen Inject 28 Units into the skin at bedtime. 27 mL 0   Insulin Pen Needle (PEN NEEDLES 3/16") 31G X 5 MM MISC 1 each by Other route daily. Use to inject insulin 100 each 0   Lancets (ACCU-CHEK MULTICLIX) lancets Use as instructed 100 each 12   lisinopril (ZESTRIL) 40 MG tablet Take 1 tablet (40 mg total) by mouth daily. 90 tablet 0   rivaroxaban (XARELTO) 20 MG TABS tablet Take 1 tablet (20 mg total) by mouth daily with supper. 90 tablet 3   rosuvastatin (CRESTOR) 20 MG tablet Take 1 tablet (20 mg total) by mouth daily. (Patient taking differently: Take 20 mg by mouth every evening.) 30 tablet 11   aspirin EC 81 MG tablet Take 1 tablet (81 mg total) by mouth daily at 6 (six) AM. Swallow whole. (Patient not taking: Reported on 10/15/2022) 30 tablet 12   No current facility-administered medications on file prior to visit.    Allergies  Allergen Reactions   Sulfa Antibiotics Hives    Social History   Socioeconomic History   Marital status: Legally Separated    Spouse name: Not on file   Number of children: Not on file   Years of education: Not on file   Highest education level: GED or equivalent  Occupational History   Not on file  Tobacco Use   Smoking status:  Former    Current packs/day: 0.00    Average packs/day: 1 pack/day for 12.0 years (12.0 ttl pk-yrs)    Types: Cigarettes    Start date: 06/09/2010    Quit date: 06/09/2022    Years since quitting: 0.3    Passive exposure: Current   Smokeless tobacco: Never  Vaping Use   Vaping status: Never Used  Substance and Sexual Activity   Alcohol use: No   Drug use: No   Sexual activity: Yes    Birth control/protection: None  Other Topics Concern   Not on file  Social History  Narrative   Not on file   Social Determinants of Health   Financial Resource Strain: Medium Risk (07/11/2022)   Overall Financial Resource Strain (CARDIA)    Difficulty of Paying Living Expenses: Somewhat hard  Food Insecurity: No Food Insecurity (07/11/2022)   Hunger Vital Sign    Worried About Running Out of Food in the Last Year: Never true    Ran Out of Food in the Last Year: Never true  Transportation Needs: No Transportation Needs (07/11/2022)   PRAPARE - Administrator, Civil Service (Medical): No    Lack of Transportation (Non-Medical): No  Physical Activity: Unknown (07/11/2022)   Exercise Vital Sign    Days of Exercise per Week: 0 days    Minutes of Exercise per Session: Not on file  Stress: Stress Concern Present (07/11/2022)   [employer] of Occupational Health - Occupational Stress Questionnaire    Feeling of Stress : To some extent  Social Connections: Moderately Isolated (07/11/2022)   Social Connection and Isolation Panel [NHANES]    Frequency of Communication with Friends and Family: More than three times a week    Frequency of Social Gatherings with Friends and Family: Patient declined    Attends Religious Services: 1 to 4 times per year    Active Member of [employer] or Organizations: No    Attends Engineer, structural: Not on file    Marital Status: Separated  Intimate Partner Violence: Patient Declined (05/14/2022)   Humiliation, Afraid, Rape, and Kick questionnaire    Fear of Current or Ex-Partner: Patient declined    Emotionally Abused: Patient declined    Physically Abused: Patient declined    Sexually Abused: Patient declined    Family History  Problem Relation Age of Onset   Diabetes Maternal Grandmother    Diabetes Paternal Grandmother     Past Surgical History:  Procedure Laterality Date   APPLICATION OF WOUND VAC Right 05/13/2022   Procedure: APPLICATION OF PREVENA WOUND VAC;  Surgeon: Nada Libman, MD;  Location: [facility] OR;   Service: Vascular;  Laterality: Right;   APPLICATION OF WOUND VAC Right 06/10/2022   Procedure: APPLICATION OF WOUND VAC;  Surgeon: [name], MD;  Location: [facility] OR;  Service: Vascular;  Laterality: Right;   CESAREAN SECTION     CESAREAN SECTION WITH BILATERAL TUBAL LIGATION Bilateral 06/01/2014   Procedure: CESAREAN SECTION WITH BILATERAL TUBAL LIGATION;  Surgeon: Sherian Rein, MD;  Location: [facility];  Service: Obstetrics;  Laterality: Bilateral;  MD requests RNFA   CHEST TUBE INSERTION     FEMORAL ARTERY EXPLORATION Right 05/13/2022   Procedure: RIGHTGROIN EXPLORATION , HEMATOMA EVACUATION;  Surgeon: Nada Libman, MD;  Location: [facility] OR;  Service: Vascular;  Laterality: Right;   FEMORAL-POPLITEAL BYPASS GRAFT Right 05/07/2022   Procedure: RIGHT FEMORAL-POPLITEAL ARTERY EMBOLECTOMY AND 4  COMPARMENT FASCIOTOMY;  Surgeon: [name], MD;  Location:  [facility] OR;  Service: Vascular;  Laterality: Right;   TONSILLECTOMY     TRACHEOSTOMY     WOUND EXPLORATION Right 06/10/2022   Procedure: IRRGATION AND DEBRIDMENT OF RIGHT GROIN;  Surgeon: [name], MD;  Location: [facility] OR;  Service: Vascular;  Laterality: Right;    ROS: Review of Systems Negative except as stated above  PHYSICAL EXAM: BP 124/84   Pulse 81   Temp 98 F (36.7 C) (Oral)   Ht 5\' 2"  (1.575 m)   Wt 181 lb 9.6 oz (82.4 kg)   SpO2 96%   BMI 33.22 kg/m   Physical Exam HENT:     Head: Normocephalic and atraumatic.     Nose: Nose normal.     Mouth/Throat:     Mouth: Mucous membranes are moist.     Pharynx: Oropharynx is clear.  Eyes:     Extraocular Movements: Extraocular movements intact.     Conjunctiva/sclera: Conjunctivae normal.     Pupils: Pupils are equal, round, and reactive to light.  Cardiovascular:     Rate and Rhythm: Normal rate and regular rhythm.     Pulses: Normal pulses.     Heart sounds: Normal heart sounds.  Pulmonary:     Effort: Pulmonary effort is normal.     Breath sounds: Normal  breath sounds.  Musculoskeletal:        General: Normal range of motion.     Cervical back: Normal range of motion and neck supple.  Neurological:     General: No focal deficit present.     Mental Status: She is alert and oriented to person, place, and time.  Psychiatric:        Mood and Affect: Mood normal.        Behavior: Behavior normal.     ASSESSMENT AND PLAN: 1. Type 2 diabetes mellitus with hyperglycemia, with long-term current use of insulin (HCC) - Continue Metformin as prescribed.  - Continue Insulin Glargine-YFGN as prescribed. No refills needed as of present.  - Discussed the importance of healthy eating habits, low-carbohydrate diet, low-sugar diet, regular aerobic exercise (at least 150 minutes a week as tolerated) and medication compliance to achieve or maintain control of diabetes. - Keep upcoming appointment with Endocrinology.  - metFORMIN (GLUCOPHAGE-XR) 500 MG 24 hr tablet; TAKE 2 TABLETS BY MOUTH TWICE DAILY WITH A MEAL  Dispense: 120 tablet; Refill: 2  2. GAD (generalized anxiety disorder) - Patient denies thoughts of self-harm, suicidal ideations, homicidal ideations. - Fluoxetine as prescribed. Counseled on medication adherence/adverse effects. - Keep all appointments with therapist.  - Follow-up with primary provider in 4 weeks or sooner if needed.  - FLUoxetine (PROZAC) 10 MG tablet; Take 1 tablet (10 mg total) by mouth daily.  Dispense: 30 tablet; Refill: 1   Patient was given the opportunity to ask questions.  Patient verbalized understanding of the plan and was able to repeat key elements of the plan. Patient was given clear instructions to go to Emergency Department or return to medical center if symptoms don't improve, worsen, or new problems develop.The patient verbalized understanding.   Requested Prescriptions   Signed Prescriptions Disp Refills   FLUoxetine (PROZAC) 10 MG tablet 30 tablet 1    Sig: Take 1 tablet (10 mg total) by mouth daily.    metFORMIN (GLUCOPHAGE-XR) 500 MG 24 hr tablet 120 tablet 2    Sig: TAKE 2 TABLETS BY MOUTH TWICE DAILY WITH A MEAL    Return in about 4 weeks (around 11/12/2022) for  Follow-Up or next available anxiety .  Rema Fendt, NP

## 2022-10-22 ENCOUNTER — Ambulatory Visit
Admission: EM | Admit: 2022-10-22 | Discharge: 2022-10-22 | Disposition: A | Discharge status: Home / Self Care | Payer: No Typology Code available for payment source | Attending: Internal Medicine | Admitting: Internal Medicine

## 2022-10-22 DIAGNOSIS — R1031 Right lower quadrant pain: Secondary | ICD-10-CM | POA: Diagnosis not present

## 2022-10-22 DIAGNOSIS — M545 Low back pain, unspecified: Secondary | ICD-10-CM | POA: Diagnosis not present

## 2022-10-22 MED ORDER — METHOCARBAMOL 500 MG PO TABS: 500.0000 mg | ORAL_TABLET | Freq: Two times a day (BID) | ORAL | 0 refills | Status: AC | PRN

## 2022-10-22 NOTE — Discharge Instructions (Signed)
I have prescribed you a muscle relaxer to take as needed.  Please be advised that it can make you drowsy so do not drive or drink alcohol with it.  Please call the provider who did your surgery to let them know that you are having groin pain as well.

## 2022-10-22 NOTE — ED Provider Notes (Signed)
[name]    CSN: [id number] Arrival date & time: 10/22/22  0800      History   Chief Complaint Chief Complaint  Patient presents with   Back Pain   Groin Pain    HPI [name] [name] is a 42 y.o. female.   Patient presents with left lower back pain and right groin pain that started about 2 days ago.  Patient denies any injury to either of the areas.  Denies urinary frequency, urinary or bowel continence, saddle anesthesia.  Patient states she does a lot of lifting, pushing, pulling, bending on a daily basis at her job but denies any obvious injury.  She has taken Tylenol for pain with minimal improvement.  Denies numbness or tingling.  Patient did have a right femoral-popliteal embolectomy and 4 compartment fasciotomy a few months prior to the right groin.  Although, she states that skin and wounds are healing well, and this groin pain "feels different" as she states it feels like a pulled muscle.   Back Pain Groin Pain    Past Medical History:  Diagnosis Date   Anxiety    Back fracture    Bipolar disorder (HCC)    Depression    Diabetes mellitus without complication (HCC)    Family history of adverse reaction to anesthesia    sister gets nausea and vomitting and itching on her face. Around her nose and mouth.   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   Hypertensive retinopathy of both eyes 11/08/2018   Nuclear age-related cataract, both eyes 11/08/2018    MDD (major depressive disorder), recurrent episode, moderate (HCC) 08/04/2018   PTSD (post-traumatic stress disorder) 08/04/2018   MDD (major depressive disorder), severe (HCC) 07/12/2018   GERD (gastroesophageal reflux disease) 02/17/2018   Type 2 diabetes mellitus without complication, without long-term current use of insulin (HCC) 02/17/2018   S/P cesarean section 06/01/2014   S/P tubal ligation 06/01/2014   H/O cesarean section complicating pregnancy 04/18/2014   Emotional disorder (HCC) 03/16/2013   Tobacco user 03/16/2013   Acute chest pain 03/01/2013   HTN (hypertension) 09/28/2012   DM (diabetes mellitus) (HCC) 11/06/2011    Past Surgical History:  Procedure Laterality Date   APPLICATION OF WOUND VAC Right 05/13/2022   Procedure: APPLICATION OF PREVENA WOUND VAC;  Surgeon: Nada Libman, MD;  Location: [facility] OR;  Service: Vascular;  Laterality: Right;   APPLICATION OF WOUND VAC Right 06/10/2022   Procedure: APPLICATION OF WOUND VAC;  Surgeon: [name], MD;  Location: [facility] OR;  Service: Vascular;  Laterality: Right;   CESAREAN SECTION     CESAREAN SECTION WITH BILATERAL TUBAL LIGATION Bilateral 06/01/2014   Procedure: CESAREAN SECTION WITH BILATERAL TUBAL LIGATION;  Surgeon: Sherian Rein, MD;  Location: [facility];  Service: Obstetrics;  Laterality: Bilateral;  MD requests RNFA   CHEST TUBE INSERTION     FEMORAL ARTERY EXPLORATION Right 05/13/2022   Procedure: RIGHTGROIN EXPLORATION ,  HEMATOMA EVACUATION;  Surgeon: Nada Libman, MD;  Location: [facility] OR;  Service: Vascular;  Laterality: Right;   FEMORAL-POPLITEAL BYPASS GRAFT Right 05/07/2022   Procedure: RIGHT FEMORAL-POPLITEAL ARTERY EMBOLECTOMY AND 4  COMPARMENT FASCIOTOMY;  Surgeon: [name], MD;  Location: [facility] OR;  Service: Vascular;  Laterality: Right;   TONSILLECTOMY     TRACHEOSTOMY     WOUND EXPLORATION Right 06/10/2022   Procedure: Reola Calkins AND DEBRIDMENT OF RIGHT GROIN;  Surgeon: [name], MD;   Location: [facility] OR;  Service: Vascular;  Laterality: Right;    OB History     Gravida  2   Para  2   Term  2   Preterm      AB      Living  2      SAB      IAB      Ectopic      Multiple  0   Live Births  2            Home Medications    Prior to Admission medications   Medication Sig Start Date End Date Taking? Authorizing Provider  acetaminophen (TYLENOL) 500 MG tablet Take 500-1,000 mg by mouth every 6 (six) hours as needed (pain.).   Yes [provider]  aspirin EC 81 MG tablet Take 1 tablet (81 mg total) by mouth daily at 6 (six) AM. Swallow whole. 05/10/22  Yes Baglia, Corrina, PA-C  Blood Glucose Monitoring Suppl (ACCU-CHEK GUIDE) w/Device KIT 1 each by Other route 4 (four) times daily -  before meals and at bedtime. 08/06/21  Yes Zonia Kief, Amy J, NP  FLUoxetine (PROZAC) 10 MG tablet Take 1 tablet (10 mg total) by mouth daily. 10/15/22  Yes Zonia Kief, Amy J, NP  gabapentin (NEURONTIN) 300 MG capsule TAKE 1 CAPSULE BY MOUTH THREE TIMES DAILY 10/02/22  Yes Zonia Kief, Amy J, NP  glucose blood (ACCU-CHEK GUIDE) test strip Use as instructed 08/06/21  Yes Zonia Kief, Amy J, NP  insulin glargine-yfgn (SEMGLEE) 100 UNIT/ML Pen Inject 28 Units into the skin at bedtime. 08/26/22 11/24/22 Yes Zonia Kief, Amy J, NP  Insulin Pen Needle (PEN NEEDLES 3/16") 31G X 5 MM MISC 1 each by Other route daily. Use to inject insulin 07/15/22  Yes Zonia Kief, Amy J, NP  Lancets (ACCU-CHEK MULTICLIX) lancets Use as instructed 08/06/21  Yes Zonia Kief, Amy J, NP  lisinopril (ZESTRIL) 40 MG tablet Take 1 tablet (40 mg total) by mouth daily. 07/15/22  Yes Zonia Kief, Amy J, NP  metFORMIN (GLUCOPHAGE-XR) 500 MG 24 hr tablet TAKE 2 TABLETS BY MOUTH TWICE DAILY WITH A MEAL 10/15/22  Yes Zonia Kief, Amy J, NP  methocarbamol (ROBAXIN) 500 MG tablet Take 1 tablet (500 mg total) by mouth 2 (two) times daily as needed for muscle spasms. 10/22/22  Yes , Acie Fredrickson, FNP  rivaroxaban (XARELTO) 20 MG TABS tablet Take  1 tablet (20 mg total) by mouth daily with supper. 07/30/22  Yes [name], MD  rosuvastatin (CRESTOR) 20 MG tablet Take 1 tablet (20 mg total) by mouth daily. Patient taking differently: Take 20 mg by mouth every evening. 05/09/22  Yes Baglia, Corrina, PA-C    Family History Family History  Problem Relation Age of Onset   Diabetes Maternal Grandmother    Diabetes Paternal Grandmother     Social History Social History   Tobacco Use   Smoking status: Former    Current packs/day: 0.00    Average packs/day: 1 pack/day for 12.0 years (12.0 ttl pk-yrs)  Types: Cigarettes    Start date: 06/09/2010    Quit date: 06/09/2022    Years since quitting: 0.3    Passive exposure: Current   Smokeless tobacco: Never  Vaping Use   Vaping status: Never Used  Substance Use Topics   Alcohol use: No   Drug use: No     Allergies   Sulfa antibiotics   Review of Systems Review of Systems Per HPI  Physical Exam Triage Vital Signs ED Triage Vitals  Encounter Vitals Group     BP 10/22/22 0834 (!) 145/87     Systolic BP Percentile --      Diastolic BP Percentile --      Pulse Rate 10/22/22 0834 83     Resp 10/22/22 0834 16     Temp 10/22/22 0834 98.2 F (36.8 C)     Temp Source 10/22/22 0834 Oral     SpO2 10/22/22 0834 96 %     Weight --      Height --      Head Circumference --      Peak Flow --      Pain Score 10/22/22 0833 9     Pain Loc --      Pain Education --      Exclude from Growth Chart --    No data found.  Updated Vital Signs BP (!) 145/87 (BP Location: Left Arm)   Pulse 83   Temp 98.2 F (36.8 C) (Oral)   Resp 16   LMP 10/22/2022 (Approximate)   SpO2 96%   Visual Acuity Right Eye Distance:   Left Eye Distance:   Bilateral Distance:    Right Eye Near:   Left Eye Near:    Bilateral Near:     Physical Exam Exam conducted with a chaperone present.  Constitutional:      General: She is not in acute distress.    Appearance: Normal appearance. She  is not toxic-appearing or diaphoretic.  HENT:     Head: Normocephalic and atraumatic.  Eyes:     Extraocular Movements: Extraocular movements intact.     Conjunctiva/sclera: Conjunctivae normal.  Pulmonary:     Effort: Pulmonary effort is normal.  Abdominal:       Comments: Patient has tenderness to palpation to mid right groin that extends slightly into right anterior upper thigh.  No discoloration or swelling noted.  No obvious wounds. Neurovascularly intact.   Musculoskeletal:       Back:     Comments: Patient has tenderness to palpation to left lower lumbar region that extends slightly into buttocks area.  No direct spinal tenderness, crepitus, step-off noted.  No swelling or discoloration noted.  Neurological:     General: No focal deficit present.     Mental Status: She is alert and oriented to person, place, and time. Mental status is at baseline.     Deep Tendon Reflexes: Reflexes are normal and symmetric.  Psychiatric:        Mood and Affect: Mood normal.        Behavior: Behavior normal.        Thought Content: Thought content normal.        Judgment: Judgment normal.      UC Treatments / Results  Labs (all labs ordered are listed, but only abnormal results are displayed) Labs Reviewed - No data to display  EKG   Radiology No results found.  Procedures Procedures (including critical care time)  Medications Ordered in UC Medications -  No data to display  Initial Impression / Assessment and Plan / UC Course  I have reviewed the triage vital signs and the nursing notes.  Pertinent labs & imaging results that were available during my care of the patient were reviewed by me and considered in my medical decision making (see chart for details).     Suspect lumbar strain of left lower back.  Patient is denying any urinary symptoms so UA was deferred.  Given no injury or direct spinal tenderness, imaging was deferred.  Discussed with patient following up with the  surgeon who performed procedure a few months ago to the right groin to discuss groin pain.  No significant neurovascular compromise or surgical complications noted on physical exam so do not think that emergent evaluation is necessary but I do think follow-up is necessary. Appears to be neurovascularly at baseline as well. Will prescribe muscle relaxer for lower back and patient reports that groin pain feels like a muscle strain so will send muscle relaxer to help with this.  Advised patient that muscle relaxer medication can make her drowsy and do not drive or drink alcohol with taking it.  Advise supportive care and strict ER precautions.  Patient verbalized understanding and was agreeable with plan. Final Clinical Impressions(s) / UC Diagnoses   Final diagnoses:  Acute left-sided low back pain without sciatica  Right groin pain     Discharge Instructions      I have prescribed you a muscle relaxer to take as needed.  Please be advised that it can make you drowsy so do not drive or drink alcohol with it.  Please call the provider who did your surgery to let them know that you are having groin pain as well.    ED Prescriptions     Medication Sig Dispense Auth. Provider   methocarbamol (ROBAXIN) 500 MG tablet Take 1 tablet (500 mg total) by mouth 2 (two) times daily as needed for muscle spasms. 20 tablet [place], Acie Fredrickson, [place]      PDMP not reviewed this encounter.   Gustavus Bryant, [place] 10/22/22 [phone]

## 2022-10-22 NOTE — ED Triage Notes (Signed)
Pt presents to the office for groin pain and lower back pain x 2 days. Denies any recent falls or trauma. Pain 10/10

## 2022-10-23 ENCOUNTER — Ambulatory Visit: Payer: No Typology Code available for payment source | Admitting: Physician Assistant

## 2022-10-23 ENCOUNTER — Telehealth: Payer: Self-pay

## 2022-10-23 ENCOUNTER — Encounter: Payer: Self-pay | Admitting: Physician Assistant

## 2022-10-23 VITALS — BP 149/100 | HR 100 | Temp 97.8°F | Wt 181.6 lb

## 2022-10-23 DIAGNOSIS — R1031 Right lower quadrant pain: Secondary | ICD-10-CM | POA: Diagnosis not present

## 2022-10-23 DIAGNOSIS — Z9889 Other specified postprocedural states: Secondary | ICD-10-CM

## 2022-10-23 NOTE — Progress Notes (Signed)
HISTORY AND PHYSICAL     CC:  follow up. Requesting Provider:  [facility]  HPI: [name] who has hx of  right femoral-popliteal embolectomy, 4 compartment fasciotomy on 05/07/2022 by Dr. Karin Lieu.  She underwent right groin exploration with evacuation hematoma on 05/13/2022 by Dr. Myra Gianotti.  On 06/10/2022, she underwent right groin washout, soft tissue debridement, antibiotic bead placement, myriad morecell powder and vac placement by Dr. Karin Lieu.   Pt was last seen 08/21/2022 and at that time, she was doing well and had healed her wound.    The pt returns today for follow up.  She states that she woke up Wednesday morning with severe pain in the right groin and down into her inner thigh.  She states that she does not remember any injury at work.  She felt like she had a pulled muscle.  She went to the urgent care and was prescribed a muscle relaxer, which has not helped.  Before all this happened 2 days ago, she was doing quite well.  She did have some swelling in her leg that had been present since surgery and this was not any worse and she did not have pain in her leg.   She has not had any drainage from her right groin.  There is no redness. She has not had any fevers.  She has tried ice packs as well as heat without relief.    The ER provider states that she was also having some left lower back pain as well.  She was not having any urinary or bowel incontinence or saddle anesthesia.  The pt is on a statin for cholesterol management.    The pt is on an aspirin.    Other AC:  Xarelto The pt is on ACEI for hypertension.  The pt is on medication for diabetes. Tobacco hx:  current-she did go back to smoking after being stopped for a month    Past Medical History:  Diagnosis Date   Anxiety    Back fracture    Bipolar disorder (HCC)    Depression    Diabetes mellitus without complication (HCC)    Family history of adverse reaction to anesthesia    sister gets nausea  and vomitting and itching on her face. Around her nose and mouth.   Fatty liver disease, nonalcoholic    GERD (gastroesophageal reflux disease)    H/O cesarean section complicating pregnancy 04/18/2014   Hypertension    Neuromuscular disorder (HCC)    eye twitch on left eye    Obesity    Pelvis fracture (HCC)    Pneumonia 2021   S/P cesarean section 06/01/2014   S/P tubal ligation 06/01/2014    Past Surgical History:  Procedure Laterality Date   APPLICATION OF WOUND VAC Right 05/13/2022   Procedure: APPLICATION OF PREVENA WOUND VAC;  Surgeon: Nada Libman, MD;  Location: [facility] OR;  Service: Vascular;  Laterality: Right;   APPLICATION OF WOUND VAC Right 06/10/2022   Procedure: APPLICATION OF WOUND VAC;  Surgeon: [name], MD;  Location: [facility] OR;  Service: Vascular;  Laterality: Right;   CESAREAN SECTION     CESAREAN SECTION WITH BILATERAL TUBAL LIGATION Bilateral 06/01/2014   Procedure: CESAREAN SECTION WITH BILATERAL TUBAL LIGATION;  Surgeon: Sherian Rein, MD;  Location: [facility]S;  Service: Obstetrics;  Laterality: Bilateral;  MD requests RNFA   CHEST TUBE INSERTION     FEMORAL ARTERY EXPLORATION Right 05/13/2022   Procedure: RIGHTGROIN  EXPLORATION , HEMATOMA EVACUATION;  Surgeon: Nada Libman, MD;  Location: [facility] OR;  Service: Vascular;  Laterality: Right;   FEMORAL-POPLITEAL BYPASS GRAFT Right 05/07/2022   Procedure: RIGHT FEMORAL-POPLITEAL ARTERY EMBOLECTOMY AND 4  COMPARMENT FASCIOTOMY;  Surgeon: [name], MD;  Location: [facility] OR;  Service: Vascular;  Laterality: Right;   TONSILLECTOMY     TRACHEOSTOMY     WOUND EXPLORATION Right 06/10/2022   Procedure: Reola Calkins AND DEBRIDMENT OF RIGHT GROIN;  Surgeon: [name], MD;  Location: [facility] OR;  Service: Vascular;  Laterality: Right;    Allergies  Allergen Reactions   Sulfa Antibiotics Hives    Current Outpatient Medications  Medication Sig Dispense Refill   acetaminophen (TYLENOL) 500 MG tablet Take 500-1,000  mg by mouth every 6 (six) hours as needed (pain.).     aspirin EC 81 MG tablet Take 1 tablet (81 mg total) by mouth daily at 6 (six) AM. Swallow whole. 30 tablet 12   Blood Glucose Monitoring Suppl (ACCU-CHEK GUIDE) w/Device KIT 1 each by Other route 4 (four) times daily -  before meals and at bedtime. 1 kit 0   FLUoxetine (PROZAC) 10 MG tablet Take 1 tablet (10 mg total) by mouth daily. 30 tablet 1   gabapentin (NEURONTIN) 300 MG capsule TAKE 1 CAPSULE BY MOUTH THREE TIMES DAILY 270 capsule 0   glucose blood (ACCU-CHEK GUIDE) test strip Use as instructed 100 each 12   insulin glargine-yfgn (SEMGLEE) 100 UNIT/ML Pen Inject 28 Units into the skin at bedtime. 27 mL 0   Insulin Pen Needle (PEN NEEDLES 3/16") 31G X 5 MM MISC 1 each by Other route daily. Use to inject insulin 100 each 0   Lancets (ACCU-CHEK MULTICLIX) lancets Use as instructed 100 each 12   lisinopril (ZESTRIL) 40 MG tablet Take 1 tablet (40 mg total) by mouth daily. 90 tablet 0   metFORMIN (GLUCOPHAGE-XR) 500 MG 24 hr tablet TAKE 2 TABLETS BY MOUTH TWICE DAILY WITH A MEAL 120 tablet 2   methocarbamol (ROBAXIN) 500 MG tablet Take 1 tablet (500 mg total) by mouth 2 (two) times daily as needed for muscle spasms. 20 tablet 0   rivaroxaban (XARELTO) 20 MG TABS tablet Take 1 tablet (20 mg total) by mouth daily with supper. 90 tablet 3   rosuvastatin (CRESTOR) 20 MG tablet Take 1 tablet (20 mg total) by mouth daily. (Patient taking differently: Take 20 mg by mouth every evening.) 30 tablet 11   No current facility-administered medications for this visit.    Family History  Problem Relation Age of Onset   Diabetes Maternal Grandmother    Diabetes Paternal Grandmother     Social History   Socioeconomic History   Marital status: Legally Separated    Spouse name: Not on file   Number of children: Not on file   Years of education: Not on file   Highest education level: GED or equivalent  Occupational History   Not on file  Tobacco  Use   Smoking status: Former    Current packs/day: 0.00    Average packs/day: 1 pack/day for 12.0 years (12.0 ttl pk-yrs)    Types: Cigarettes    Start date: 06/09/2010    Quit date: 06/09/2022    Years since quitting: 0.3    Passive exposure: Current   Smokeless tobacco: Never  Vaping Use   Vaping status: Never Used  Substance and Sexual Activity   Alcohol use: No   Drug use: No   Sexual activity:  Yes    Birth control/protection: None  Other Topics Concern   Not on file  Social History Narrative   Not on file   Social Determinants of Health   Financial Resource Strain: Medium Risk (07/11/2022)   Overall Financial Resource Strain (CARDIA)    Difficulty of Paying Living Expenses: Somewhat hard  Food Insecurity: No Food Insecurity (07/11/2022)   Hunger Vital Sign    Worried About Running Out of Food in the Last Year: Never true    Ran Out of Food in the Last Year: Never true  Transportation Needs: No Transportation Needs (07/11/2022)   PRAPARE - Administrator, Civil Service (Medical): No    Lack of Transportation (Non-Medical): No  Physical Activity: Unknown (07/11/2022)   Exercise Vital Sign    Days of Exercise per Week: 0 days    Minutes of Exercise per Session: Not on file  Stress: Stress Concern Present (07/11/2022)   [employer] of Occupational Health - Occupational Stress Questionnaire    Feeling of Stress : To some extent  Social Connections: Moderately Isolated (07/11/2022)   Social Connection and Isolation Panel [NHANES]    Frequency of Communication with Friends and Family: More than three times a week    Frequency of Social Gatherings with Friends and Family: Patient declined    Attends Religious Services: 1 to 4 times per year    Active Member of [employer] or Organizations: No    Attends Engineer, structural: Not on file    Marital Status: Separated  Intimate Partner Violence: Patient Declined (05/14/2022)   Humiliation, Afraid, Rape, and Kick  questionnaire    Fear of Current or Ex-Partner: Patient declined    Emotionally Abused: Patient declined    Physically Abused: Patient declined    Sexually Abused: Patient declined     REVIEW OF SYSTEMS:   [X]  denotes positive finding, [ ]  denotes negative finding Cardiac  Comments:  Chest pain or chest pressure:    Shortness of breath upon exertion:    Short of breath when lying flat:    Irregular heart rhythm:        Vascular    Pain in calf, thigh, or hip brought on by ambulation:    Pain in feet at night that wakes you up from your sleep:     Blood clot in your veins:    Leg swelling:  x       Pulmonary    Oxygen at home:    Productive cough:     Wheezing:         Neurologic    Sudden weakness in arms or legs:     Sudden numbness in arms or legs:     Sudden onset of difficulty speaking or slurred speech:    Temporary loss of vision in one eye:     Problems with dizziness:         Gastrointestinal    Blood in stool:     Vomited blood:         Genitourinary    Burning when urinating:     Blood in urine:        Psychiatric    Major depression:         Hematologic    Bleeding problems:    Problems with blood clotting too easily:        Skin    Rashes or ulcers:        Constitutional    Fever or chills:  PHYSICAL EXAMINATION:  Today's Vitals   10/23/22 1313  BP: (!) 149/100  Pulse: 100  Temp: 97.8 F (36.6 C)  TempSrc: Temporal  Weight: 181 lb 9.6 oz (82.4 kg)   Body mass index is 33.22 kg/m.   General:  WDWN in NAD; vital signs documented above Gait: Not observed HENT: WNL, normocephalic Pulmonary: normal non-labored breathing , without wheezing Cardiac: regular HR Abdomen: soft, NT Skin: without rashes Vascular Exam/Pulses: She has an easily palpable right DP pulse; she does not have a pulsatile mass in her right groin.  No bruit is heard in her right groin.  Also examined by fellow PA in office with same physical exam findings.   She is tender to palpation over the right groin and into her medial thigh.   Extremities: without ischemic changes, without Gangrene , without cellulitis; without open wounds; there is no erythema, the right inner thigh is very soft and no palpable masses or firmness.  Musculoskeletal: no muscle wasting or atrophy  Neurologic: A&O X 3 Psychiatric:  The pt has Normal and tearful affect.     ASSESSMENT/PLAN:: 42 y.o. female here for follow up with hx of right femoral-popliteal embolectomy, 4 compartment fasciotomy on 05/07/2022 by Dr. Karin Lieu.  She underwent right groin exploration with evacuation hematoma on 05/13/2022 by Dr. Myra Gianotti.  On 06/10/2022, she underwent right groin washout, soft tissue debridement, antibiotic bead placement, myriad morecell powder and vac placement by Dr. Karin Lieu.   -pt comes in today with painful right groin.  Her incision is healed and there is no drainage present. She does not have a pulsatile mass in her right groin, she does not have any palpable masses or firmness in the right inner thigh.  There is no erythema and she has not had any fevers.  Fortunately, she does have an easily palpable right DP pulse.  Right leg is soft and mildly swollen, which pt states is normal.  -at this time, would continue with muscle relaxer and heat/ice packs to see if she can get some relief.  If not better by mid week, would contact PCP for further guidance.   -pt will f/u as needed.    Doreatha Massed, [facility] [phone]  Clinic MD:   Hetty Blend on call MD

## 2022-10-23 NOTE — Telephone Encounter (Signed)
Pt called after hours line, spoke with Dr. Lenell Antu who advised that she call the office when it opens to make an appt for today.  Caller: Patient  Concern: severe groin pain at the surgical incision area, difficulty ambulating or repositioning  Location: R groin & upper thigh  Description:  new onset as of Wednesday AM, progressively worsened  Aggravating Factors: movement  Treatments:  Pt went to UC yesterday, dx of pulled muscle, given Methocarbamol with no improvement  Procedure: Embolectomy and hematoma evac and I&D in April and May 2024  Resolution: Appointment scheduled for today per Dr. Lenell Antu  Next Appt: Appointment scheduled for 10/11 @ 1315 with PA

## 2022-10-28 ENCOUNTER — Other Ambulatory Visit: Payer: Self-pay

## 2022-10-28 ENCOUNTER — Other Ambulatory Visit: Payer: Self-pay | Admitting: Family

## 2022-10-28 DIAGNOSIS — I10 Essential (primary) hypertension: Secondary | ICD-10-CM

## 2022-10-28 MED ORDER — LISINOPRIL 40 MG PO TABS: 40.0000 mg | ORAL_TABLET | Freq: Every day | ORAL | 0 refills | Status: DC

## 2022-11-09 ENCOUNTER — Ambulatory Visit (INDEPENDENT_AMBULATORY_CARE_PROVIDER_SITE_OTHER): Payer: No Typology Code available for payment source | Admitting: Internal Medicine

## 2022-11-09 ENCOUNTER — Encounter: Payer: Self-pay | Admitting: Internal Medicine

## 2022-11-09 VITALS — BP 126/86 | HR 88 | Ht 62.0 in | Wt 183.6 lb

## 2022-11-09 DIAGNOSIS — E1129 Type 2 diabetes mellitus with other diabetic kidney complication: Secondary | ICD-10-CM

## 2022-11-09 DIAGNOSIS — E1151 Type 2 diabetes mellitus with diabetic peripheral angiopathy without gangrene: Secondary | ICD-10-CM | POA: Diagnosis not present

## 2022-11-09 DIAGNOSIS — E1142 Type 2 diabetes mellitus with diabetic polyneuropathy: Secondary | ICD-10-CM | POA: Diagnosis not present

## 2022-11-09 DIAGNOSIS — Z794 Long term (current) use of insulin: Secondary | ICD-10-CM

## 2022-11-09 DIAGNOSIS — E1165 Type 2 diabetes mellitus with hyperglycemia: Secondary | ICD-10-CM

## 2022-11-09 DIAGNOSIS — R809 Proteinuria, unspecified: Secondary | ICD-10-CM

## 2022-11-09 LAB — POCT GLUCOSE (DEVICE FOR HOME USE): POC Glucose: 190 mg/dL — AB (ref 70–99)

## 2022-11-09 LAB — POCT GLYCOSYLATED HEMOGLOBIN (HGB A1C): Hemoglobin A1C: 10.1 % — AB (ref 4.0–5.6)

## 2022-11-09 MED ORDER — METFORMIN HCL ER 500 MG PO TB24: 1000.0000 mg | ORAL_TABLET | Freq: Two times a day (BID) | ORAL | 2 refills | Status: DC

## 2022-11-09 MED ORDER — INSULIN GLARGINE-YFGN 100 UNIT/ML ~~LOC~~ SOPN: 36.0000 [IU] | PEN_INJECTOR | Freq: Every day | SUBCUTANEOUS | 4 refills | Status: DC

## 2022-11-09 MED ORDER — INSULIN PEN NEEDLE 32G X 4 MM MISC: 1.0000 | Freq: Every day | 3 refills | Status: DC

## 2022-11-09 MED ORDER — TIRZEPATIDE 2.5 MG/0.5ML ~~LOC~~ SOAJ: 2.5000 mg | SUBCUTANEOUS | 2 refills | Status: DC

## 2022-11-09 MED ORDER — DEXCOM G7 SENSOR MISC
1.0000 | 3 refills | Status: DC
Start: 2022-11-09 — End: 2023-02-23

## 2022-11-09 NOTE — Patient Instructions (Addendum)
Start Mounjaro 2.5 mg weekly  Continue Metformin 500g XR , 2 tablets with breakfast and Supper  Increase Semglee 36 units once daily    HOW TO TREAT LOW BLOOD SUGARS (Blood sugar LESS THAN 70 MG/DL) Please follow the RULE OF 15 for the treatment of hypoglycemia treatment (when your (blood sugars are less than 70 mg/dL)   STEP 1: Take 15 grams of carbohydrates when your blood sugar is low, which includes:  3-4 GLUCOSE TABS  OR 3-4 OZ OF JUICE OR REGULAR SODA OR ONE TUBE OF GLUCOSE GEL    STEP 2: RECHECK blood sugar in 15 MINUTES STEP 3: If your blood sugar is still low at the 15 minute recheck --> then, go back to STEP 1 and treat AGAIN with another 15 grams of carbohydrates.

## 2022-11-09 NOTE — Progress Notes (Signed)
Name: [name] [name]  MRN/ DOB: [id number], 07/05/1980   Age/ Sex: 42 y.o., female    PCP: [facility]   Reason for Endocrinology Evaluation: Type 2 Diabetes Mellitus     Date of Initial Endocrinology Visit: 11/09/2022     PATIENT IDENTIFIER: [name] [name] is a 42 y.o. female with a past medical history of DM, HTN, PAD, GERD. The patient presented for initial endocrinology clinic visit on 11/09/2022 for consultative assistance with her diabetes management.    HPI: [name] [name] was    Diagnosed with DM years ago  Prior Medications tried/Intolerance: Jardiance- insurance coverage. Trulicity - nausea and vomiting , Ozempic - does not recall side effects  Currently checking blood sugars 0 x / day Hypoglycemia episodes : 0              Hemoglobin A1c has ranged from 8.1% in 2020, peaking at 11.0% in 2023.  In terms of diet, the patient eats 1 meal a day. Snacks occasionally. Avoids sugar sweetened beverages.   She works at [employer] tree   Denies nausea or vomiting  Denies constipation or diarrhea     HOME DIABETES REGIMEN: Metformin 500 mg XR , 2 tabs BID Semglee 33 units daily      Statin: Yes ACE-I/ARB: Yes   METER DOWNLOAD SUMMARY: n/a  DIABETIC COMPLICATIONS: Microvascular complications:  Microalbuminuria , neuropathy  Denies: CKD Last eye exam: Completed   Macrovascular complications:  PVD Denies: CAD, CVA   PAST HISTORY: Past Medical History:  Past Medical History:  Diagnosis Date   Anxiety    Back fracture    Bipolar disorder (HCC)    Depression    Diabetes mellitus without complication (HCC)    Family history of adverse reaction to anesthesia    sister gets nausea and vomitting and itching on her face. Around her nose and mouth.   Fatty liver disease, nonalcoholic    GERD (gastroesophageal reflux disease)    H/O cesarean section complicating pregnancy 04/18/2014   Hypertension    Neuromuscular disorder (HCC)    eye  twitch on left eye    Obesity    Pelvis fracture (HCC)    Pneumonia 2021   S/P cesarean section 06/01/2014   S/P tubal ligation 06/01/2014   Past Surgical History:  Past Surgical History:  Procedure Laterality Date   APPLICATION OF WOUND VAC Right 05/13/2022   Procedure: APPLICATION OF PREVENA WOUND VAC;  Surgeon: Nada Libman, MD;  Location: [facility] OR;  Service: Vascular;  Laterality: Right;   APPLICATION OF WOUND VAC Right 06/10/2022   Procedure: APPLICATION OF WOUND VAC;  Surgeon: [name], MD;  Location: [facility] OR;  Service: Vascular;  Laterality: Right;   CESAREAN SECTION     CESAREAN SECTION WITH BILATERAL TUBAL LIGATION Bilateral 06/01/2014   Procedure: CESAREAN SECTION WITH BILATERAL TUBAL LIGATION;  Surgeon: Sherian Rein, MD;  Location: [facility] ORS;  Service: Obstetrics;  Laterality: Bilateral;  MD requests RNFA   CHEST TUBE INSERTION     FEMORAL ARTERY EXPLORATION Right 05/13/2022   Procedure: RIGHTGROIN EXPLORATION , HEMATOMA EVACUATION;  Surgeon: Nada Libman, MD;  Location: [facility] OR;  Service: Vascular;  Laterality: Right;   FEMORAL-POPLITEAL BYPASS GRAFT Right 05/07/2022   Procedure: RIGHT FEMORAL-POPLITEAL ARTERY EMBOLECTOMY AND 4  COMPARMENT FASCIOTOMY;  Surgeon: [name], MD;  Location: [facility] OR;  Service: Vascular;  Laterality: Right;   TONSILLECTOMY     TRACHEOSTOMY     WOUND EXPLORATION Right 06/10/2022  Procedure: IRRGATION AND DEBRIDMENT OF RIGHT GROIN;  Surgeon: [name], MD;  Location: [facility] OR;  Service: Vascular;  Laterality: Right;    Social History:  reports that she quit smoking about 5 months ago. Her smoking use included cigarettes. She started smoking about 12 years ago. She has a 12 pack-year smoking history. She has been exposed to tobacco smoke. She has never used smokeless tobacco. She reports that she does not drink alcohol and does not use drugs. Family History:  Family History  Problem Relation Age of Onset   Diabetes Maternal  Grandmother    Diabetes Paternal Grandmother      HOME MEDICATIONS: Allergies as of 11/09/2022       Reactions   Sulfa Antibiotics Hives        Medication List        Accurate as of November 09, 2022 11:09 AM. If you have any questions, ask your nurse or doctor.          Accu-Chek Guide test strip Generic drug: glucose blood Use as instructed   Accu-Chek Guide w/Device Kit 1 each by Other route 4 (four) times daily -  before meals and at bedtime.   accu-chek multiclix lancets Use as instructed   acetaminophen 500 MG tablet Commonly known as: TYLENOL Take 500-1,000 mg by mouth every 6 (six) hours as needed (pain.).   aspirin EC 81 MG tablet Take 1 tablet (81 mg total) by mouth daily at 6 (six) AM. Swallow whole.   Dexcom G7 Sensor Misc 1 Device by Does not apply route as directed. Started by: Johnney Ou Garvin Ellena   FLUoxetine 10 MG tablet Commonly known as: PROZAC Take 1 tablet (10 mg total) by mouth daily.   gabapentin 300 MG capsule Commonly known as: NEURONTIN TAKE 1 CAPSULE BY MOUTH THREE TIMES DAILY   insulin glargine-yfgn 100 UNIT/ML Pen Commonly known as: SEMGLEE Inject 36 Units into the skin at bedtime. What changed: how much to take Changed by: Johnney Ou Anahlia Iseminger   Insulin Pen Needle 32G X 4 MM Misc 1 Device by Does not apply route daily in the afternoon. What changed:  medication strength how much to take how to take this when to take this additional instructions Changed by: Johnney Ou China Deitrick   lisinopril 40 MG tablet Commonly known as: ZESTRIL Take 1 tablet by mouth once daily   lisinopril 40 MG tablet Commonly known as: ZESTRIL Take 1 tablet (40 mg total) by mouth daily.   metFORMIN 500 MG 24 hr tablet Commonly known as: GLUCOPHAGE-XR Take 2 tablets (1,000 mg total) by mouth 2 (two) times daily with a meal. What changed:  how much to take how to take this when to take this additional instructions Changed by: Johnney Ou  Ardeth Repetto   methocarbamol 500 MG tablet Commonly known as: ROBAXIN Take 1 tablet (500 mg total) by mouth 2 (two) times daily as needed for muscle spasms.   rivaroxaban 20 MG Tabs tablet Commonly known as: XARELTO Take 1 tablet (20 mg total) by mouth daily with supper.   rosuvastatin 20 MG tablet Commonly known as: CRESTOR Take 1 tablet (20 mg total) by mouth daily. What changed: when to take this   tirzepatide 2.5 MG/0.5ML Pen Commonly known as: MOUNJARO Inject 2.5 mg into the skin once a week. Started by: Johnney Ou Lalena Salas         ALLERGIES: Allergies  Allergen Reactions   Sulfa Antibiotics Hives     REVIEW OF SYSTEMS: A comprehensive  ROS was conducted with the patient and is negative except as per HPI     OBJECTIVE:   VITAL SIGNS: BP 126/86 (BP Location: Left Arm, Patient Position: Sitting, Cuff Size: Large)   Pulse 88   Ht 5\' 2"  (1.575 m)   Wt 183 lb 9.6 oz (83.3 kg)   LMP 10/22/2022 (Approximate)   SpO2 96%   BMI 33.58 kg/m    PHYSICAL EXAM:  General: Pt appears well and is in NAD  Neck: General: Supple without adenopathy or carotid bruits. Thyroid: Thyroid size normal.  No goiter or nodules appreciated.   Lungs: Clear with good BS bilat   Heart: RRR   Abdomen:  soft, nontender  Extremities:  Lower extremities - No pretibial edema.   Neuro: MS is good with appropriate affect, pt is alert and Ox3    DM foot exam: 11/09/2022  The skin of the feet is intact without sores or ulcerations. The pedal pulses are 2+ on right and 2+ on left. The sensation is absent  to a screening 5.07, 10 gram monofilament bilaterally    DATA REVIEWED:  Lab Results  Component Value Date   HGBA1C 10.1 (A) 11/09/2022   HGBA1C 8.6 (H) 07/15/2022   HGBA1C 9.0 (H) 05/08/2022    Latest Reference Range & Units 07/15/22 14:06  Sodium 134 - 144 mmol/L 138  Potassium 3.5 - 5.2 mmol/L 4.7  Chloride 96 - 106 mmol/L 102  CO2 20 - 29 mmol/L 22  Glucose 70 - 99 mg/dL  409 (H)  BUN 6 - 24 mg/dL 11  Creatinine 8.11 - 9.14 mg/dL 7.82 (L)  Calcium 8.7 - 10.2 mg/dL 9.9  BUN/Creatinine Ratio 9 - 23  23  eGFR >59 mL/min/1.73 120    Latest Reference Range & Units 07/15/22 14:07  MICROALB/CREAT RATIO 0 - 29 mg/g creat 68 (H)  (H): Data is abnormally high  ASSESSMENT / PLAN / RECOMMENDATIONS:   1) Type 2 Diabetes Mellitus, Poorly controlled, With microalbuminuria, neuropathic and macrovascular  complications - Most recent A1c of 10.1 %. Goal A1c < 7.0 %.      GENERAL: I have discussed with the patient the pathophysiology of diabetes. We went over the natural progression of the disease. We stressed the importance of lifestyle changes. I explained the complications associated with diabetes including retinopathy, nephropathy, neuropathy as well as increased risk of cardiovascular disease. We went over the benefit seen with glycemic control.   I explained to the patient that diabetic patients are at higher than normal risk for amputations.  She has been on Trulicity as well as Ozempic.  She does recall GI side effects to Trulicity, she does not recall the cause of discontinuation of Ozempic I have recommended Mounjaro due to more tolerable side effect profile I will increase the insulin as below We also discussed SGLT2 inhibitors, but we opted to hold off on this for now  MEDICATIONS: Start Mounjaro 2.5 mg weekly Continue metformin 500 mg XR, 2 tabs twice daily Increase Semglee 36 units once daily  EDUCATION / INSTRUCTIONS: BG monitoring instructions: Patient is instructed to check her blood sugars 1 times a day. Call [facility] if: BG persistently < 70  I reviewed the Rule of 15 for the treatment of hypoglycemia in detail with the patient. Literature supplied.   2) Diabetic complications:  Eye: Does not have known diabetic retinopathy.  Neuro/ Feet: Does  have known diabetic peripheral neuropathy. Renal: Patient does not have known  baseline CKD. She is  on an ACEI/ARB at present.  3)Microalbuminuria :   -Patient on lisinopril -Encouraged optimizing glucose control   Signed electronically by: Lyndle Herrlich, MD  [facility] Endocrinology  [facility] [address] [place], [place] [address] Phone: [phone] FAX: [phone]   CC: [facility] [address]  [address] [place] [address] Phone: [phone]  Fax: [phone]    Return to Endocrinology clinic as below: Future Appointments  Date Time Provider Department Center  11/25/2022  4:00 PM Rema Fendt, NP [facility] None  02/23/2023  3:00 PM Saja Bartolini, Konrad Dolores, MD [facility] None

## 2022-11-16 ENCOUNTER — Telehealth: Payer: Self-pay

## 2022-11-16 ENCOUNTER — Other Ambulatory Visit (HOSPITAL_COMMUNITY): Payer: Self-pay

## 2022-11-16 ENCOUNTER — Ambulatory Visit: Payer: No Typology Code available for payment source | Admitting: Family

## 2022-11-16 NOTE — Telephone Encounter (Signed)
Pharmacy Patient Advocate Encounter   Received notification from CoverMyMeds that prior authorization for [facility] is required/requested.   Insurance verification completed.   The patient is insured through [facility] .   Per test claim: PA required; PA submitted to above mentioned insurance via CoverMyMeds Key/confirmation #/EOC [id number] Status is pending

## 2022-11-17 ENCOUNTER — Other Ambulatory Visit (HOSPITAL_COMMUNITY): Payer: Self-pay

## 2022-11-17 NOTE — Telephone Encounter (Signed)
Pharmacy Patient Advocate Encounter  Received notification from [facility] that Prior Authorization for [name] [name] has been APPROVED through 11/16/2023

## 2022-11-25 ENCOUNTER — Ambulatory Visit (INDEPENDENT_AMBULATORY_CARE_PROVIDER_SITE_OTHER): Payer: No Typology Code available for payment source | Admitting: Family

## 2022-11-25 VITALS — BP 112/77 | HR 83 | Temp 97.8°F | Resp 18 | Ht 62.0 in | Wt 177.2 lb

## 2022-11-25 DIAGNOSIS — F411 Generalized anxiety disorder: Secondary | ICD-10-CM

## 2022-11-25 MED ORDER — FLUOXETINE HCL 20 MG PO TABS: 20.0000 mg | ORAL_TABLET | Freq: Every day | ORAL | 1 refills | Status: DC

## 2022-11-25 NOTE — Progress Notes (Unsigned)
Patient ID: [name] [name], female    DOB: 05-20-1980  MRN: [id number]  CC: Chronic Conditions Follow-Up  Subjective: [name] [name] is a 42 y.o. female who presents for chronic conditions follow-up.  Her concerns today include:  - Doesn't feel Fluoxetine is helping as much as she would like. Requests referral to Psychiatry. She denies thoughts of self-harm, suicidal ideations, homicidal ideations. - Established with Endocrinology.  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   Hypertensive retinopathy of both eyes 11/08/2018   Nuclear age-related cataract, both eyes 11/08/2018   MDD (major depressive disorder), recurrent episode, moderate (HCC) 08/04/2018   PTSD (post-traumatic stress disorder) 08/04/2018   MDD (major depressive disorder), severe (HCC) 07/12/2018   GERD (gastroesophageal reflux disease) 02/17/2018   Type 2 diabetes mellitus without complication, without long-term current use of insulin (HCC) 02/17/2018   S/P cesarean section 06/01/2014   S/P tubal ligation 06/01/2014   H/O cesarean section complicating pregnancy 04/18/2014   Emotional disorder (HCC) 03/16/2013   Tobacco user 03/16/2013   Acute chest pain 03/01/2013   HTN (hypertension) 09/28/2012   DM (diabetes mellitus) (HCC) 11/06/2011     Current Outpatient Medications on File Prior to Visit  Medication Sig Dispense Refill   acetaminophen (TYLENOL) 500 MG tablet Take 500-1,000 mg by mouth every 6 (six) hours as needed (pain.).     aspirin EC 81 MG tablet Take 1 tablet (81 mg total) by mouth daily at 6 (six) AM. Swallow whole. 30 tablet 12   Blood Glucose Monitoring Suppl (ACCU-CHEK GUIDE) w/Device KIT 1 each by Other route 4 (four) times daily -  before meals and at bedtime. 1 kit 0   Continuous Glucose Sensor (DEXCOM G7 SENSOR) MISC 1  Device by Does not apply route as directed. 9 each 3   gabapentin (NEURONTIN) 300 MG capsule TAKE 1 CAPSULE BY MOUTH THREE TIMES DAILY 270 capsule 0   glucose blood (ACCU-CHEK GUIDE) test strip Use as instructed 100 each 12   insulin glargine-yfgn (SEMGLEE) 100 UNIT/ML Pen Inject 36 Units into the skin at bedtime. 30 mL 4   Insulin Pen Needle 32G X 4 MM MISC 1 Device by Does not apply route daily in the afternoon. 100 each 3   Lancets (ACCU-CHEK MULTICLIX) lancets Use as instructed 100 each 12   lisinopril (ZESTRIL) 40 MG tablet Take 1 tablet by mouth once daily 90 tablet 0   lisinopril (ZESTRIL) 40 MG tablet Take 1 tablet (40 mg total) by mouth daily. 90 tablet 0   metFORMIN (GLUCOPHAGE-XR) 500 MG 24 hr tablet Take 2 tablets (1,000 mg total) by mouth 2 (two) times daily with a meal. 360 tablet 2   methocarbamol (ROBAXIN) 500 MG tablet Take 1 tablet (500 mg total) by mouth 2 (two) times daily as needed for muscle spasms. 20 tablet 0   rivaroxaban (XARELTO) 20 MG TABS tablet Take 1 tablet (20 mg total) by mouth daily with supper. 90 tablet 3   rosuvastatin (CRESTOR) 20 MG tablet Take 1 tablet (20 mg total) by mouth daily. (Patient taking differently: Take 20 mg by mouth every evening.) 30 tablet 11   tirzepatide (MOUNJARO) 2.5 MG/0.5ML Pen Inject 2.5 mg into the skin once a week. 6 mL 2   No current facility-administered medications on file prior to visit.    Allergies  Allergen  Reactions   Sulfa Antibiotics Hives    Social History   Socioeconomic History   Marital status: Legally Separated    Spouse name: Not on file   Number of children: Not on file   Years of education: Not on file   Highest education level: GED or equivalent  Occupational History   Not on file  Tobacco Use   Smoking status: Former    Current packs/day: 0.00    Average packs/day: 1 pack/day for 12.0 years (12.0 ttl pk-yrs)    Types: Cigarettes    Start date: 06/09/2010    Quit date: 06/09/2022    Years since  quitting: 0.4    Passive exposure: Current   Smokeless tobacco: Never  Vaping Use   Vaping status: Never Used  Substance and Sexual Activity   Alcohol use: No   Drug use: No   Sexual activity: Yes    Birth control/protection: None  Other Topics Concern   Not on file  Social History Narrative   Not on file   Social Determinants of Health   Financial Resource Strain: Patient Declined (11/25/2022)   Overall Financial Resource Strain (CARDIA)    Difficulty of Paying Living Expenses: Patient declined  Food Insecurity: Patient Declined (11/25/2022)   Hunger Vital Sign    Worried About Running Out of Food in the Last Year: Patient declined    Ran Out of Food in the Last Year: Patient declined  Transportation Needs: No Transportation Needs (11/25/2022)   PRAPARE - Administrator, Civil Service (Medical): No    Lack of Transportation (Non-Medical): No  Physical Activity: Sufficiently Active (11/25/2022)   Exercise Vital Sign    Days of Exercise per Week: 3 days    Minutes of Exercise per Session: 110 min  Stress: Stress Concern Present (07/11/2022)   [employer] of Occupational Health - Occupational Stress Questionnaire    Feeling of Stress : To some extent  Social Connections: Unknown (11/25/2022)   Social Connection and Isolation Panel [NHANES]    Frequency of Communication with Friends and Family: Patient declined    Frequency of Social Gatherings with Friends and Family: Never    Attends Religious Services: Patient declined    Database administrator or Organizations: No    Attends Engineer, structural: Not on file    Marital Status: Divorced  Intimate Partner Violence: Patient Declined (05/14/2022)   Humiliation, Afraid, Rape, and Kick questionnaire    Fear of Current or Ex-Partner: Patient declined    Emotionally Abused: Patient declined    Physically Abused: Patient declined    Sexually Abused: Patient declined    Family History  Problem  Relation Age of Onset   Diabetes Maternal Grandmother    Diabetes Paternal Grandmother     Past Surgical History:  Procedure Laterality Date   APPLICATION OF WOUND VAC Right 05/13/2022   Procedure: APPLICATION OF PREVENA WOUND VAC;  Surgeon: Nada Libman, MD;  Location: [facility] OR;  Service: Vascular;  Laterality: Right;   APPLICATION OF WOUND VAC Right 06/10/2022   Procedure: APPLICATION OF WOUND VAC;  Surgeon: [name], MD;  Location: [facility] OR;  Service: Vascular;  Laterality: Right;   CESAREAN SECTION     CESAREAN SECTION WITH BILATERAL TUBAL LIGATION Bilateral 06/01/2014   Procedure: CESAREAN SECTION WITH BILATERAL TUBAL LIGATION;  Surgeon: Sherian Rein, MD;  Location: [facility];  Service: Obstetrics;  Laterality: Bilateral;  MD requests RNFA   CHEST TUBE INSERTION  FEMORAL ARTERY EXPLORATION Right 05/13/2022   Procedure: RIGHTGROIN EXPLORATION , HEMATOMA EVACUATION;  Surgeon: Nada Libman, MD;  Location: [facility] OR;  Service: Vascular;  Laterality: Right;   FEMORAL-POPLITEAL BYPASS GRAFT Right 05/07/2022   Procedure: RIGHT FEMORAL-POPLITEAL ARTERY EMBOLECTOMY AND 4  COMPARMENT FASCIOTOMY;  Surgeon: [name], MD;  Location[facility];  Service: Vascular;  Laterality: Right;   TONSILLECTOMY     TRACHEOSTOMY     WOUND EXPLORATION Right 06/10/2022   Procedure: IRRGATION AND DEBRIDMENT OF RIGHT GROIN;  Surgeon: [name], MD;  Location: [facility];  Service: Vascular;  Laterality: Right;    ROS: Review of Systems Negative except as stated above  PHYSICAL EXAM: BP 112/77   Pulse 83   Temp 97.8 F (36.6 C)   Resp 18   Ht 5\' 2"  (1.575 m)   Wt 177 lb 3.2 oz (80.4 kg)   SpO2 94%   BMI 32.41 kg/m   Physical Exam HENT:     Head: Normocephalic and atraumatic.     Nose: Nose normal.     Mouth/Throat:     Mouth: Mucous membranes are moist.     Pharynx: Oropharynx is clear.  Eyes:     Extraocular Movements: Extraocular movements intact.     Conjunctiva/sclera:  Conjunctivae normal.     Pupils: Pupils are equal, round, and reactive to light.  Cardiovascular:     Rate and Rhythm: Normal rate and regular rhythm.     Pulses: Normal pulses.     Heart sounds: Normal heart sounds.  Pulmonary:     Effort: Pulmonary effort is normal.     Breath sounds: Normal breath sounds.  Musculoskeletal:        General: Normal range of motion.     Cervical back: Normal range of motion and neck supple.  Neurological:     General: No focal deficit present.     Mental Status: She is alert and oriented to person, place, and time.  Psychiatric:        Mood and Affect: Mood normal.        Behavior: Behavior normal.     ASSESSMENT AND PLAN: 1. GAD (generalized anxiety disorder) - Patient denies thoughts of self-harm, suicidal ideations, homicidal ideations. - Increase Fluoxetine from 10 mg daily to 20 mg daily. Counseled on medication adherence/adverse effects. - Referral to Psychiatry for further evaluation/management. During the interim follow-up with primary provider in 4 weeks or sooner if needed until established with referral. - FLUoxetine (PROZAC) 20 MG tablet; Take 1 tablet (20 mg total) by mouth daily.  Dispense: 30 tablet; Refill: 1 - Ambulatory referral to Psychiatry    Patient was given the opportunity to ask questions.  Patient verbalized understanding of the plan and was able to repeat key elements of the plan. Patient was given clear instructions to go to Emergency Department or return to medical center if symptoms don't improve, worsen, or new problems develop.The patient verbalized understanding.   Orders Placed This Encounter  Procedures   Ambulatory referral to Psychiatry     Requested Prescriptions   Signed Prescriptions Disp Refills   FLUoxetine (PROZAC) 20 MG tablet 30 tablet 1    Sig: Take 1 tablet (20 mg total) by mouth daily.    Follow-up with primary provider as scheduled.   Rema Fendt, NP

## 2022-12-11 ENCOUNTER — Other Ambulatory Visit: Payer: Self-pay | Admitting: Family

## 2022-12-11 DIAGNOSIS — F411 Generalized anxiety disorder: Secondary | ICD-10-CM

## 2023-01-02 ENCOUNTER — Other Ambulatory Visit: Payer: Self-pay | Admitting: Family

## 2023-01-02 DIAGNOSIS — E1141 Type 2 diabetes mellitus with diabetic mononeuropathy: Secondary | ICD-10-CM

## 2023-01-04 NOTE — Telephone Encounter (Signed)
Complete

## 2023-02-04 ENCOUNTER — Telehealth: Payer: No Typology Code available for payment source | Admitting: Physician Assistant

## 2023-02-04 DIAGNOSIS — B9689 Other specified bacterial agents as the cause of diseases classified elsewhere: Secondary | ICD-10-CM

## 2023-02-04 DIAGNOSIS — J019 Acute sinusitis, unspecified: Secondary | ICD-10-CM | POA: Diagnosis not present

## 2023-02-04 MED ORDER — AMOXICILLIN-POT CLAVULANATE 875-125 MG PO TABS: 1.0000 | ORAL_TABLET | Freq: Two times a day (BID) | ORAL | 0 refills | Status: DC

## 2023-02-04 NOTE — Progress Notes (Signed)
I have spent 5 minutes in review of e-visit questionnaire, review and updating patient chart, medical decision making and response to patient.   Mia Milan Cody Jacklynn Dehaas, PA-C    

## 2023-02-04 NOTE — Progress Notes (Signed)

## 2023-02-23 ENCOUNTER — Ambulatory Visit (INDEPENDENT_AMBULATORY_CARE_PROVIDER_SITE_OTHER): Payer: No Typology Code available for payment source | Admitting: Internal Medicine

## 2023-02-23 ENCOUNTER — Encounter: Payer: Self-pay | Admitting: Internal Medicine

## 2023-02-23 VITALS — BP 120/74 | HR 89 | Ht 62.0 in | Wt 176.0 lb

## 2023-02-23 DIAGNOSIS — E1142 Type 2 diabetes mellitus with diabetic polyneuropathy: Secondary | ICD-10-CM | POA: Diagnosis not present

## 2023-02-23 DIAGNOSIS — E1165 Type 2 diabetes mellitus with hyperglycemia: Secondary | ICD-10-CM | POA: Diagnosis not present

## 2023-02-23 DIAGNOSIS — Z794 Long term (current) use of insulin: Secondary | ICD-10-CM | POA: Diagnosis not present

## 2023-02-23 DIAGNOSIS — E1129 Type 2 diabetes mellitus with other diabetic kidney complication: Secondary | ICD-10-CM

## 2023-02-23 DIAGNOSIS — E1151 Type 2 diabetes mellitus with diabetic peripheral angiopathy without gangrene: Secondary | ICD-10-CM | POA: Diagnosis not present

## 2023-02-23 DIAGNOSIS — R809 Proteinuria, unspecified: Secondary | ICD-10-CM

## 2023-02-23 LAB — POCT GLYCOSYLATED HEMOGLOBIN (HGB A1C): Hemoglobin A1C: 6.9 % — AB (ref 4.0–5.6)

## 2023-02-23 LAB — POCT GLUCOSE (DEVICE FOR HOME USE): POC Glucose: 176 mg/dL — AB (ref 70–99)

## 2023-02-23 MED ORDER — METFORMIN HCL ER 500 MG PO TB24: 1000.0000 mg | ORAL_TABLET | Freq: Two times a day (BID) | ORAL | 3 refills | Status: DC

## 2023-02-23 MED ORDER — TIRZEPATIDE 5 MG/0.5ML ~~LOC~~ SOAJ: 5.0000 mg | SUBCUTANEOUS | 3 refills | Status: DC

## 2023-02-23 MED ORDER — INSULIN GLARGINE-YFGN 100 UNIT/ML ~~LOC~~ SOPN: 30.0000 [IU] | PEN_INJECTOR | Freq: Every day | SUBCUTANEOUS | 4 refills | Status: DC

## 2023-02-23 MED ORDER — INSULIN PEN NEEDLE 32G X 4 MM MISC: 1.0000 | Freq: Every day | 3 refills | Status: DC

## 2023-02-23 MED ORDER — ONDANSETRON HCL 4 MG PO TABS: 4.0000 mg | ORAL_TABLET | Freq: Three times a day (TID) | ORAL | 0 refills | Status: AC | PRN

## 2023-02-23 NOTE — Progress Notes (Signed)
 Name: [name] [name]  MRN/ DOB: [id number], [birth year]   Age/ Sex: 43 y.o., female    PCP: [facility]   Reason for Endocrinology Evaluation: Type 2 Diabetes Mellitus     Date of Initial Endocrinology Visit: 11/09/2022    PATIENT IDENTIFIER: [name] [name] is a 43 y.o. female with a past medical history of DM, HTN, PAD, GERD. The patient presented for initial endocrinology clinic visit on 11/09/2022 for consultative assistance with her diabetes management.    HPI: [name] [name] was    Diagnosed with DM years ago  Prior Medications tried/Intolerance: Jardiance- insurance coverage. Trulicity - nausea and vomiting , Ozempic - does not recall side effects         Hemoglobin A1c has ranged from 8.1% in 2020, peaking at 11.0% in 2023.   She works at [employer]   On her initial visit to our clinic she had an A1c of 10.1%, she was on metformin and Semglee, and I started her on Mounjaro    SUBJECTIVE:   During the last visit (11/09/2022): A1c 10.1%  Today (02/23/23): [name] [name] is here for follow-up on diabetes management.  She checks her blood sugars 0 times daily.    Has nausea but no vomiting  Has occasional diarrhea but no constipation    HOME DIABETES REGIMEN: Metformin 500 mg XR , 2 tabs BID Mounjaro 2.5 mg weekly Semglee 36 units daily      Statin: Yes ACE-I/ARB: Yes   METER DOWNLOAD SUMMARY: n/a  DIABETIC COMPLICATIONS: Microvascular complications:  Microalbuminuria , neuropathy  Denies: CKD Last eye exam: Completed   Macrovascular complications:  PVD Denies: CAD, CVA   PAST HISTORY: Past Medical History:  Past Medical History:  Diagnosis Date   Anxiety    Back fracture    Bipolar disorder (HCC)    Depression    Diabetes mellitus without complication (HCC)    Family history of adverse reaction to anesthesia    sister gets nausea and vomitting and itching on her face. Around her nose and mouth.   Fatty liver  disease, nonalcoholic    GERD (gastroesophageal reflux disease)    H/O cesarean section complicating pregnancy 04/18/2014   Hypertension    Neuromuscular disorder (HCC)    eye twitch on left eye    Obesity    Pelvis fracture (HCC)    Pneumonia 2021   S/P cesarean section 06/01/2014   S/P tubal ligation 06/01/2014   Past Surgical History:  Past Surgical History:  Procedure Laterality Date   APPLICATION OF WOUND VAC Right 05/13/2022   Procedure: APPLICATION OF PREVENA WOUND VAC;  Surgeon: Nada Libman, MD;  Location: [facility] OR;  Service: Vascular;  Laterality: Right;   APPLICATION OF WOUND VAC Right 06/10/2022   Procedure: APPLICATION OF WOUND VAC;  Surgeon: [name], MD;  Location: [facility] OR;  Service: Vascular;  Laterality: Right;   CESAREAN SECTION     CESAREAN SECTION WITH BILATERAL TUBAL LIGATION Bilateral 06/01/2014   Procedure: CESAREAN SECTION WITH BILATERAL TUBAL LIGATION;  Surgeon: Sherian Rein, MD;  Location: [facility];  Service: Obstetrics;  Laterality: Bilateral;  MD requests RNFA   CHEST TUBE INSERTION     FEMORAL ARTERY EXPLORATION Right 05/13/2022   Procedure: RIGHTGROIN EXPLORATION , HEMATOMA EVACUATION;  Surgeon: Nada Libman, MD;  Location: [facility] OR;  Service: Vascular;  Laterality: Right;   FEMORAL-POPLITEAL BYPASS GRAFT Right 05/07/2022   Procedure: RIGHT FEMORAL-POPLITEAL ARTERY EMBOLECTOMY AND 4  COMPARMENT FASCIOTOMY;  Surgeon: [name], MD;  Location: [facility] OR;  Service: Vascular;  Laterality: Right;   TONSILLECTOMY     TRACHEOSTOMY     WOUND EXPLORATION Right 06/10/2022   Procedure: Reola Calkins AND DEBRIDMENT OF RIGHT GROIN;  Surgeon: [name], MD;  Location: [facility] OR;  Service: Vascular;  Laterality: Right;    Social History:  reports that she quit smoking about 8 months ago. Her smoking use included cigarettes. She started smoking about 12 years ago. She has a 12 pack-year smoking history. She has been exposed to tobacco smoke. She has never used  smokeless tobacco. She reports that she does not drink alcohol and does not use drugs. Family History:  Family History  Problem Relation Age of Onset   Diabetes Maternal Grandmother    Diabetes Paternal Grandmother      HOME MEDICATIONS: Allergies as of 02/23/2023       Reactions   Sulfa Antibiotics Hives        Medication List        Accurate as of February 23, 2023  3:05 PM. If you have any questions, ask your nurse or doctor.          STOP taking these medications    amoxicillin-clavulanate 875-125 MG tablet Commonly known as: AUGMENTIN Stopped by: Johnney Ou Riyana Biel       TAKE these medications    Accu-Chek Guide test strip Generic drug: glucose blood Use as instructed   Accu-Chek Guide w/Device Kit 1 each by Other route 4 (four) times daily -  before meals and at bedtime.   accu-chek multiclix lancets Use as instructed   acetaminophen 500 MG tablet Commonly known as: TYLENOL Take 500-1,000 mg by mouth every 6 (six) hours as needed (pain.).   aspirin EC 81 MG tablet Take 1 tablet (81 mg total) by mouth daily at 6 (six) AM. Swallow whole.   Dexcom G7 Sensor Misc 1 Device by Does not apply route as directed.   FLUoxetine 20 MG tablet Commonly known as: PROZAC Take 1 tablet (20 mg total) by mouth daily.   gabapentin 300 MG capsule Commonly known as: NEURONTIN TAKE 1 CAPSULE BY MOUTH THREE TIMES DAILY   insulin glargine-yfgn 100 UNIT/ML Pen Commonly known as: SEMGLEE Inject 36 Units into the skin at bedtime.   Insulin Pen Needle 32G X 4 MM Misc 1 Device by Does not apply route daily in the afternoon.   lisinopril 40 MG tablet Commonly known as: ZESTRIL Take 1 tablet by mouth once daily   lisinopril 40 MG tablet Commonly known as: ZESTRIL Take 1 tablet (40 mg total) by mouth daily.   metFORMIN 500 MG 24 hr tablet Commonly known as: GLUCOPHAGE-XR Take 2 tablets (1,000 mg total) by mouth 2 (two) times daily with a meal.    methocarbamol 500 MG tablet Commonly known as: ROBAXIN Take 1 tablet (500 mg total) by mouth 2 (two) times daily as needed for muscle spasms.   rivaroxaban 20 MG Tabs tablet Commonly known as: XARELTO Take 1 tablet (20 mg total) by mouth daily with supper.   rosuvastatin 20 MG tablet Commonly known as: CRESTOR Take 1 tablet (20 mg total) by mouth daily. What changed: when to take this   tirzepatide 2.5 MG/0.5ML Pen Commonly known as: MOUNJARO Inject 2.5 mg into the skin once a week.         ALLERGIES: Allergies  Allergen Reactions   Sulfa Antibiotics Hives     REVIEW OF SYSTEMS: A comprehensive ROS  was conducted with the patient and is negative except as per HPI     OBJECTIVE:   VITAL SIGNS: BP 120/74 (BP Location: Left Arm, Patient Position: Sitting, Cuff Size: Normal)   Pulse 89   Ht 5\' 2"  (1.575 m)   Wt 176 lb (79.8 kg)   SpO2 98%   BMI 32.19 kg/m    PHYSICAL EXAM:  General: Pt appears well and is in NAD  Neck: General: Supple without adenopathy or carotid bruits. Thyroid: Thyroid size normal.  No goiter or nodules appreciated.   Lungs: Clear with good BS bilat   Heart: RRR   Abdomen:  soft, nontender  Extremities:  Lower extremities - No pretibial edema.   Neuro: MS is good with appropriate affect, pt is alert and Ox3    DM foot exam: 11/09/2022  The skin of the feet is intact without sores or ulcerations. The pedal pulses are 2+ on right and 2+ on left. The sensation is absent  to a screening 5.07, 10 gram monofilament bilaterally    DATA REVIEWED:  Lab Results  Component Value Date   HGBA1C 6.9 (A) 02/23/2023   HGBA1C 10.1 (A) 11/09/2022   HGBA1C 8.6 (H) 07/15/2022    Latest Reference Range & Units 07/15/22 14:06  Sodium 134 - 144 mmol/L 138  Potassium 3.5 - 5.2 mmol/L 4.7  Chloride 96 - 106 mmol/L 102  CO2 20 - 29 mmol/L 22  Glucose 70 - 99 mg/dL 132 (H)  BUN 6 - 24 mg/dL 11  Creatinine 4.40 - 1.02 mg/dL 7.25 (L)  Calcium 8.7 -  10.2 mg/dL 9.9  BUN/Creatinine Ratio 9 - 23  23  eGFR >59 mL/min/1.73 120    Latest Reference Range & Units 07/15/22 14:07  MICROALB/CREAT RATIO 0 - 29 mg/g creat 68 (H)  (H): Data is abnormally high  In office BG 176 Mg/DL  ASSESSMENT / PLAN / RECOMMENDATIONS:   1) Type 2 Diabetes Mellitus, Optimally  controlled, With microalbuminuria, neuropathic and macrovascular  complications - Most recent A1c of 6.9%. Goal A1c < 7.0 %.     -I have praised the patient on improving glycemic control, A1c trended down from 10.1% to 6.9% -She has been on Trulicity as well as Ozempic.  She does recall GI side effects to Trulicity, she does not recall the cause of discontinuation of Ozempic -We also discussed SGLT2 inhibitors, but we opted to hold off on this for now -Will increase Mounjaro and decrease insulin as below  MEDICATIONS: Increase Mounjaro 5 mg weekly Continue metformin 500 mg XR, 2 tabs twice daily Decrease Semglee 30 units once daily  EDUCATION / INSTRUCTIONS: BG monitoring instructions: Patient is instructed to check her blood sugars 1 times a day. Call [facility] if: BG persistently < 70  I reviewed the Rule of 15 for the treatment of hypoglycemia in detail with the patient. Literature supplied.   2) Diabetic complications:  Eye: Does not have known diabetic retinopathy.  Neuro/ Feet: Does  have known diabetic peripheral neuropathy. Renal: Patient does not have known baseline CKD. She is  on an ACEI/ARB at present.  3)Microalbuminuria :  -I have praised the patient on improved glycemic control -Patient on lisinopril -Will check by next visit  4) Nausea:  -She has occasional nausea, Zofran was prescribed to be used as needed  Signed electronically by: Lyndle Herrlich, MD  [facility]  [facility] [address] [place] [place], [place] [address] Phone: [phone] FAX: [phone]  CC: [facility] [address]  [address] [place] [address] Phone: [phone]  Fax: [phone]    Return to Endocrinology clinic as below: No future appointments.

## 2023-02-23 NOTE — Patient Instructions (Addendum)
Increase Mounjaro 5 mg weekly  Continue Metformin 500g XR , 2 tablets with breakfast and Supper  Decrease 30 units once daily    HOW TO TREAT LOW BLOOD SUGARS (Blood sugar LESS THAN 70 MG/DL) Please follow the RULE OF 15 for the treatment of hypoglycemia treatment (when your (blood sugars are less than 70 mg/dL)   STEP 1: Take 15 grams of carbohydrates when your blood sugar is low, which includes:  3-4 GLUCOSE TABS  OR 3-4 OZ OF JUICE OR REGULAR SODA OR ONE TUBE OF GLUCOSE GEL    STEP 2: RECHECK blood sugar in 15 MINUTES STEP 3: If your blood sugar is still low at the 15 minute recheck --> then, go back to STEP 1 and treat AGAIN with another 15 grams of carbohydrates.

## 2023-04-03 ENCOUNTER — Other Ambulatory Visit: Payer: Self-pay | Admitting: Family

## 2023-04-03 DIAGNOSIS — Z794 Long term (current) use of insulin: Secondary | ICD-10-CM

## 2023-04-03 DIAGNOSIS — E1141 Type 2 diabetes mellitus with diabetic mononeuropathy: Secondary | ICD-10-CM

## 2023-04-05 NOTE — Telephone Encounter (Signed)
 Complete

## 2023-04-14 ENCOUNTER — Telehealth: Payer: Self-pay

## 2023-04-14 ENCOUNTER — Other Ambulatory Visit: Payer: Self-pay

## 2023-04-14 NOTE — Telephone Encounter (Signed)
 Triage: -pt LM stating she is having problems with both her legs, the right is worse than the L and she thinks she has a DVT. -returned call to pt who describes both legs as being heaving, tired, painful, and swollen with the right being worse than the other.  She states it feels like she has a clot.   She states she is wearing her compression hose when she can, she is taking her xarelto as directed, but she is working long days and is on her feet more than usually.   -consulted with PA.  Schedule duplex and office visit.

## 2023-04-15 ENCOUNTER — Other Ambulatory Visit: Payer: Self-pay

## 2023-04-15 DIAGNOSIS — Z9889 Other specified postprocedural states: Secondary | ICD-10-CM

## 2023-04-20 ENCOUNTER — Ambulatory Visit (HOSPITAL_COMMUNITY)
Admission: RE | Admit: 2023-04-20 | Discharge: 2023-04-20 | Disposition: A | Discharge status: Home / Self Care | Source: Ambulatory Visit | Attending: Vascular Surgery | Admitting: Vascular Surgery

## 2023-04-20 DIAGNOSIS — Z9889 Other specified postprocedural states: Secondary | ICD-10-CM | POA: Insufficient documentation

## 2023-04-21 ENCOUNTER — Ambulatory Visit: Admitting: Physician Assistant

## 2023-04-21 ENCOUNTER — Encounter: Payer: Self-pay | Admitting: Vascular Surgery

## 2023-04-21 VITALS — BP 138/90 | HR 79 | Temp 98.1°F | Ht 62.0 in | Wt 172.4 lb

## 2023-04-21 DIAGNOSIS — M7989 Other specified soft tissue disorders: Secondary | ICD-10-CM | POA: Diagnosis not present

## 2023-04-21 NOTE — Progress Notes (Unsigned)
 Office Note   History of Present Illness   [name] [name] is a 43 y.o. (14-Sep-1980) female who presents for surveillance of PAD. They have a history of ***  The patient returns today for follow up. He/she denies any recent medical history changes. The patient also denies any claudication, rest pain, or tissue loss of the lower extremities.  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 Went from being on her feet 6-8 hrs a day to 14-16 hrs daily. Much worse leg swelling, causing intense heaviness and deep throbbing. Wearing knee high compression stockings and it does help control her swelling and sx. Not able to elevate her legs at all at work. Tries to elevate at home. 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 Given a leave of absence letter to stay out of work tiil the 20th. RTC PRN. Can return in the future with reflux studies if swelling continues to worsen  Current Outpatient Medications  Medication Sig Dispense Refill   acetaminophen (TYLENOL) 500 MG tablet Take 500-1,000 mg by mouth every 6 (six) hours as needed (pain.).     aspirin EC 81 MG tablet Take 1 tablet (81 mg total) by mouth daily at 6 (six) AM. Swallow whole. 30 tablet 12   Blood Glucose Monitoring Suppl (ACCU-CHEK GUIDE) w/Device KIT 1 each by Other route 4 (four) times daily -  before meals and at bedtime. 1 kit 0   FLUoxetine (PROZAC) 20 MG tablet Take 1 tablet (20 mg total) by mouth daily. 30 tablet 1   gabapentin (NEURONTIN) 300 MG capsule TAKE 1 CAPSULE BY MOUTH THREE TIMES DAILY 270 capsule 0   glucose blood (ACCU-CHEK GUIDE) test strip Use as instructed 100 each 12   insulin glargine-yfgn (SEMGLEE) 100 UNIT/ML Pen Inject 30 Units into the skin at bedtime. 30 mL 4   Insulin Pen Needle 32G X 4 MM MISC 1 Device by Does not apply route daily in the afternoon. 100 each 3   Lancets (ACCU-CHEK MULTICLIX) lancets Use as instructed 100  each 12   lisinopril (ZESTRIL) 40 MG tablet Take 1 tablet by mouth once daily 90 tablet 0   lisinopril (ZESTRIL) 40 MG tablet Take 1 tablet (40 mg total) by mouth daily. 90 tablet 0   metFORMIN (GLUCOPHAGE-XR) 500 MG 24 hr tablet Take 2 tablets (1,000 mg total) by mouth 2 (two) times daily with a meal. 360 tablet 3   methocarbamol (ROBAXIN) 500 MG tablet Take 1 tablet (500 mg total) by mouth 2 (two) times daily as needed for muscle spasms. 20 tablet 0   ondansetron (ZOFRAN) 4 MG tablet Take 1 tablet (4 mg total) by mouth every 8 (eight) hours as needed for nausea or vomiting. 20 tablet 0   rivaroxaban (XARELTO) 20 MG TABS tablet Take 1 tablet (20 mg total) by mouth daily with supper. 90 tablet 3   rosuvastatin (CRESTOR) 20 MG tablet Take 1 tablet (20 mg total) by mouth daily. (Patient taking differently: Take 20 mg by mouth every evening.) 30 tablet 11   tirzepatide (MOUNJARO) 5 MG/0.5ML Pen Inject 5 mg into the skin once a week. 6 mL 3   No current facility-administered medications for this visit.    ***REVIEW OF SYSTEMS (negative unless checked):   Cardiac:  []  Chest pain or chest pressure? []  Shortness of breath upon activity? []  Shortness of breath when lying flat? []  Irregular heart rhythm?  Vascular:  []  Pain in calf, thigh, or hip brought on by walking? []  Pain in feet at night that wakes you up from your sleep? []  Blood clot in your veins? []  Leg swelling?  Pulmonary:  []  Oxygen at home? []  Productive cough? []  Wheezing?  Neurologic:  []  Sudden weakness in arms or legs? []  Sudden numbness in arms or legs? []  Sudden onset of difficult speaking or slurred speech? []  Temporary loss of vision in one eye? []  Problems with dizziness?  Gastrointestinal:  []  Blood in stool? []  Vomited blood?  Genitourinary:  []  Burning when urinating? []  Blood in urine?  Psychiatric:  []  Major depression  Hematologic:  []  Bleeding problems? []  Problems with blood  clotting?  Dermatologic:  []  Rashes or ulcers?  Constitutional:  []  Fever or chills?  Ear/Nose/Throat:  []  Change in hearing? []  Nose bleeds? []  Sore throat?  Musculoskeletal:  []  Back pain? []  Joint pain? []  Muscle pain?   Physical Examination  ***There were no vitals filed for this visit. ***There is no height or weight on file to calculate BMI.  General:  WDWN in NAD; vital signs documented above Gait: Not observed HENT: WNL, normocephalic Pulmonary: normal non-labored breathing , without rales, rhonchi,  wheezing Cardiac: {Desc; regular/irreg:14544} HR, without murmurs {With/Without:20273} carotid bruit*** Abdomen: soft, NT, no masses Skin: {With/Without:20273} rashes Vascular Exam/Pulses: Palpable/nonpalpable femoral pulses, palpable/nonpalpable popliteal pulses, palpable/nonpalpable pedal pulses. Left DP/PT/Peroneal doppler signals. Right DP/PT/Peroneal doppler signals Extremities: {With/Without:20273} ischemic changes, {With/Without:20273} gangrene , {With/Without:20273} cellulitis; {With/Without:20273} open wounds;  Musculoskeletal: no muscle wasting or atrophy  Neurologic: A&O X 3;  No focal weakness or paresthesias are detected Psychiatric:  The pt has {Desc; normal/abnormal:11317::"Normal"} affect.  Non-Invasive Vascular imaging   ABI (***) R:  ABI: *** (***),  PT: {Signals:19197::"none","mono","bi","tri"} DP: {Signals:19197::"none","mono","bi","tri"} TBI:  *** L:  ABI: *** (***),  PT: {Signals:19197::"none","mono","bi","tri"} DP: {Signals:19197::"none","mono","bi","tri"} TBI: ***  Aortoiliac Duplex (***)   *** Arterial Duplex (***)   Medical Decision Making   [name] [name] is a 43 y.o. female who presents for surveillance of PAD  Based on the patient's vascular studies, their ABIs are essentially unchanged since last visit. *** Arterial duplex *** The patient denies any claudication, rest pain, or tissue loss. They have palpable/nonpalpable  pedal pulses with *** doppler signals They will continue their *** and follow up with our office in *** months/year with ABIs and ***   Loel Dubonnet PA-C [facility] Office: [phone]  Clinic MD: ***

## 2023-04-29 ENCOUNTER — Other Ambulatory Visit: Payer: Self-pay | Admitting: Family

## 2023-04-29 DIAGNOSIS — I10 Essential (primary) hypertension: Secondary | ICD-10-CM

## 2023-06-29 ENCOUNTER — Encounter: Payer: Self-pay | Admitting: Internal Medicine

## 2023-06-29 ENCOUNTER — Ambulatory Visit (INDEPENDENT_AMBULATORY_CARE_PROVIDER_SITE_OTHER): Payer: No Typology Code available for payment source | Admitting: Internal Medicine

## 2023-06-29 VITALS — BP 122/80 | HR 84 | Ht 62.0 in | Wt 168.0 lb

## 2023-06-29 DIAGNOSIS — Z794 Long term (current) use of insulin: Secondary | ICD-10-CM

## 2023-06-29 DIAGNOSIS — E1142 Type 2 diabetes mellitus with diabetic polyneuropathy: Secondary | ICD-10-CM | POA: Diagnosis not present

## 2023-06-29 DIAGNOSIS — E1151 Type 2 diabetes mellitus with diabetic peripheral angiopathy without gangrene: Secondary | ICD-10-CM | POA: Diagnosis not present

## 2023-06-29 DIAGNOSIS — E785 Hyperlipidemia, unspecified: Secondary | ICD-10-CM | POA: Diagnosis not present

## 2023-06-29 DIAGNOSIS — E1129 Type 2 diabetes mellitus with other diabetic kidney complication: Secondary | ICD-10-CM | POA: Diagnosis not present

## 2023-06-29 DIAGNOSIS — R809 Proteinuria, unspecified: Secondary | ICD-10-CM

## 2023-06-29 LAB — POCT GLUCOSE (DEVICE FOR HOME USE): Glucose Fasting, POC: 138 mg/dL — AB (ref 70–99)

## 2023-06-29 LAB — POCT GLYCOSYLATED HEMOGLOBIN (HGB A1C): Hemoglobin A1C: 6.8 % — AB (ref 4.0–5.6)

## 2023-06-29 MED ORDER — TIRZEPATIDE 5 MG/0.5ML ~~LOC~~ SOAJ: 5.0000 mg | SUBCUTANEOUS | 3 refills | Status: DC

## 2023-06-29 NOTE — Progress Notes (Signed)
 Name: [name] [name]  MRN/ DOB: [id number], 05-Jul-1980   Age/ Sex: 42 y.o., female    PCP: Senaida Dama, NP   Reason for Endocrinology Evaluation: Type 2 Diabetes Mellitus     Date of Initial Endocrinology Visit: 11/09/2022    PATIENT IDENTIFIER: [name] [name] is a 43 y.o. female with a past medical history of DM, HTN, PAD, GERD. The patient presented for initial endocrinology clinic visit on 11/09/2022 for consultative assistance with her diabetes management.    HPI: [name] [name] was    Diagnosed with DM years ago  Prior Medications tried/Intolerance: Jardiance - insurance coverage. Trulicity  - nausea and vomiting , Ozempic - does not recall side effects         Hemoglobin A1c has ranged from 8.1% in 2020, peaking at 11.0% in 2023.   She works at [employer]   On her initial visit to our clinic she had an A1c of 10.1%, she was on metformin  and Semglee , and I started her on Mounjaro     SUBJECTIVE:   During the last visit (02/23/2023): A1c 6.9%     Today (06/29/23): [name] [name] is here for follow-up on diabetes management.  She checks her blood sugars 0 times daily.    Continues with mild nausea but no vomiting - uses zofran  as needed  Has occasional diarrhea but no constipation      HOME DIABETES REGIMEN: Metformin  500 mg XR , 2 tabs BID Mounjaro  5 mg weekly Semglee  30 units daily -takes 36 units     Statin: Yes ACE-I/ARB: Yes   METER DOWNLOAD SUMMARY: n/a  DIABETIC COMPLICATIONS: Microvascular complications:  Microalbuminuria , neuropathy  Denies: CKD Last eye exam: Completed   Macrovascular complications:  PVD Denies: CAD, CVA   PAST HISTORY: Past Medical History:  Past Medical History:  Diagnosis Date   Anxiety    Back fracture    Bipolar disorder (HCC)    Depression    Diabetes mellitus without complication (HCC)    Family history of adverse reaction to anesthesia    sister gets nausea and vomitting and itching on her  face. Around her nose and mouth.   Fatty liver disease, nonalcoholic    GERD (gastroesophageal reflux disease)    H/O cesarean section complicating pregnancy 04/18/2014   Hypertension    Neuromuscular disorder (HCC)    eye twitch on left eye    Obesity    Pelvis fracture (HCC)    Pneumonia 2021   S/P cesarean section 06/01/2014   S/P tubal ligation 06/01/2014   Past Surgical History:  Past Surgical History:  Procedure Laterality Date   APPLICATION OF WOUND VAC Right 05/13/2022   Procedure: APPLICATION OF PREVENA WOUND VAC;  Surgeon: Margherita Shell, MD;  Location: [facility] OR;  Service: Vascular;  Laterality: Right;   APPLICATION OF WOUND VAC Right 06/10/2022   Procedure: APPLICATION OF WOUND VAC;  Surgeon: Kayla Part, MD;  Location: [facility] OR;  Service: Vascular;  Laterality: Right;   CESAREAN SECTION     CESAREAN SECTION WITH BILATERAL TUBAL LIGATION Bilateral 06/01/2014   Procedure: CESAREAN SECTION WITH BILATERAL TUBAL LIGATION;  Surgeon: Margaretmary Shaver, MD;  Location: [facility] ORS;  Service: Obstetrics;  Laterality: Bilateral;  MD requests RNFA   CHEST TUBE INSERTION     FEMORAL ARTERY EXPLORATION Right 05/13/2022   Procedure: RIGHTGROIN EXPLORATION , HEMATOMA EVACUATION;  Surgeon: Margherita Shell, MD;  Location: [facility] OR;  Service: Vascular;  Laterality: Right;   FEMORAL-POPLITEAL BYPASS GRAFT Right  05/07/2022   Procedure: RIGHT FEMORAL-POPLITEAL ARTERY EMBOLECTOMY AND 4  COMPARMENT FASCIOTOMY;  Surgeon: Kayla Part, MD;  Location: [facility] OR;  Service: Vascular;  Laterality: Right;   TONSILLECTOMY     TRACHEOSTOMY     WOUND EXPLORATION Right 06/10/2022   Procedure: IRRGATION AND DEBRIDMENT OF RIGHT GROIN;  Surgeon: Kayla Part, MD;  Location: [facility] OR;  Service: Vascular;  Laterality: Right;    Social History:  reports that she quit smoking about 12 months ago. Her smoking use included cigarettes. She started smoking about 13 years ago. She has a 12 pack-year smoking history. She has  been exposed to tobacco smoke. She has never used smokeless tobacco. She reports that she does not drink alcohol and does not use drugs. Family History:  Family History  Problem Relation Age of Onset   Diabetes Maternal Grandmother    Diabetes Paternal Grandmother      HOME MEDICATIONS: Allergies as of 06/29/2023       Reactions   Sulfa Antibiotics Hives        Medication List        Accurate as of June 29, 2023  8:12 AM. If you have any questions, ask your nurse or doctor.          Accu-Chek Guide test strip Generic drug: glucose blood Use as instructed   Accu-Chek Guide w/Device Kit 1 each by Other route 4 (four) times daily -  before meals and at bedtime.   accu-chek multiclix lancets Use as instructed   acetaminophen  500 MG tablet Commonly known as: TYLENOL  Take 500-1,000 mg by mouth every 6 (six) hours as needed (pain.).   aspirin  EC 81 MG tablet Take 1 tablet (81 mg total) by mouth daily at 6 (six) AM. Swallow whole.   FLUoxetine  20 MG tablet Commonly known as: PROZAC  Take 1 tablet (20 mg total) by mouth daily.   gabapentin  300 MG capsule Commonly known as: NEURONTIN  TAKE 1 CAPSULE BY MOUTH THREE TIMES DAILY   insulin  glargine-yfgn 100 UNIT/ML Pen Commonly known as: SEMGLEE  Inject 30 Units into the skin at bedtime. What changed: how much to take   Insulin  Pen Needle 32G X 4 MM Misc 1 Device by Does not apply route daily in the afternoon.   lisinopril  40 MG tablet Commonly known as: ZESTRIL  Take 1 tablet by mouth once daily   lisinopril  40 MG tablet Commonly known as: ZESTRIL  Take 1 tablet (40 mg total) by mouth daily.   metFORMIN  500 MG 24 hr tablet Commonly known as: GLUCOPHAGE -XR Take 2 tablets (1,000 mg total) by mouth 2 (two) times daily with a meal.   methocarbamol  500 MG tablet Commonly known as: ROBAXIN  Take 1 tablet (500 mg total) by mouth 2 (two) times daily as needed for muscle spasms.   ondansetron  4 MG tablet Commonly  known as: Zofran  Take 1 tablet (4 mg total) by mouth every 8 (eight) hours as needed for nausea or vomiting.   rivaroxaban  20 MG Tabs tablet Commonly known as: XARELTO  Take 1 tablet (20 mg total) by mouth daily with supper.   rosuvastatin  20 MG tablet Commonly known as: CRESTOR  Take 1 tablet (20 mg total) by mouth daily. What changed: when to take this   tirzepatide  5 MG/0.5ML Pen Commonly known as: MOUNJARO  Inject 5 mg into the skin once a week.         ALLERGIES: Allergies  Allergen Reactions   Sulfa Antibiotics Hives     REVIEW OF SYSTEMS: A comprehensive  ROS was conducted with the patient and is negative except as per HPI     OBJECTIVE:   VITAL SIGNS: BP 122/80 (BP Location: Left Arm, Patient Position: Sitting, Cuff Size: Normal)   Pulse 84   SpO2 98%      There were no vitals filed for this visit.  PHYSICAL EXAM:  General: Pt appears well and is in NAD  Neck: General: Supple without adenopathy or carotid bruits. Thyroid : Thyroid  size normal.  No goiter or nodules appreciated.   Lungs: Clear with good BS bilat   Heart: RRR   Abdomen:  soft, nontender  Extremities:  Lower extremities - No pretibial edema.   Neuro: MS is good with appropriate affect, pt is alert and Ox3    DM foot exam: 06/29/2023  The skin of the feet is intact without sores or ulcerations. The pedal pulses are 2+ on right and 2+ on left. The sensation is decreased  to a screening 5.07, 10 gram monofilament bilaterally    DATA REVIEWED:  Lab Results  Component Value Date   HGBA1C 6.8 (A) 06/29/2023   HGBA1C 6.9 (A) 02/23/2023   HGBA1C 10.1 (A) 11/09/2022    Latest Reference Range & Units 06/29/23 08:32  Sodium 135 - 146 mmol/L 139  Potassium 3.5 - 5.3 mmol/L 4.7  Chloride 98 - 110 mmol/L 105  CO2 20 - 32 mmol/L 26  Glucose 65 - 99 mg/dL 409 (H)  BUN 7 - 25 mg/dL 11  Creatinine 8.11 - 9.14 mg/dL 7.82  Calcium  8.6 - 10.2 mg/dL 9.4  BUN/Creatinine Ratio 6 - 22 (calc) SEE  NOTE:  eGFR > OR = 60 mL/min/1.71m2 117  Total CHOL/HDL Ratio <5.0 (calc) 3.7  Cholesterol <200 mg/dL 956  HDL Cholesterol > OR = 50 mg/dL 38 (L)  LDL Cholesterol (Calc) mg/dL (calc) 79  MICROALB/CREAT RATIO <30 mg/g creat 20  Non-HDL Cholesterol (Calc) <130 mg/dL (calc) 213  Triglycerides <150 mg/dL 086 (H)    Latest Reference Range & Units 06/29/23 08:32  Microalb, Ur mg/dL 2.4  MICROALB/CREAT RATIO <30 mg/g creat 20  Creatinine, Urine 20 - 275 mg/dL 578      In office BG 469 Mg/DL  ASSESSMENT / PLAN / RECOMMENDATIONS:   1) Type 2 Diabetes Mellitus, Optimally  controlled, With microalbuminuria, neuropathic and macrovascular  complications - Most recent A1c of 6.8 %. Goal A1c < 7.0 %.     -A1c remains optimal -She has been on Trulicity  as well as Ozempic.  She does recall GI side effects to Trulicity , she does not recall the cause of discontinuation of Ozempic -I had increased her Mounjaro  on the last visit, but [facility] continues to give her the Mounjaro  2.5 mg dose -Will increase Mounjaro  again and decrease insulin  preemptively to prevent hypoglycemia   MEDICATIONS: Increase Mounjaro  5 mg weekly Continue metformin  500 mg XR, 2 tabs twice daily Decrease Semglee  32 units once daily  EDUCATION / INSTRUCTIONS: BG monitoring instructions: Patient is instructed to check her blood sugars 1 times a day. Call [facility] if: BG persistently < 70  I reviewed the Rule of 15 for the treatment of hypoglycemia in detail with the patient. Literature supplied.   2) Diabetic complications:  Eye: Does not have known diabetic retinopathy.  Neuro/ Feet: Does  have known diabetic peripheral neuropathy. Renal: Patient does not have known baseline CKD. She is  on an ACEI/ARB at present.  3)Microalbuminuria :  -I have praised the patient on improved glycemic control -Patient on  lisinopril  - Resolved  3) Dyslipidemia :  - Lipid panel shows optimization of LDL,  triglycerides slightly elevated but has trended down tremendously   Medication Continue rosuvastatin  20 mg daily  Follow-up in 6 months  Signed electronically by: Natale Bail, MD  [facility]  [facility] [address] [place], [place] [address] Phone: [phone] FAX: [phone]   CC: Senaida Dama, NP [address] [place] [address] Phone: [phone]  Fax: [phone]    Return to Endocrinology clinic as below: No future appointments.

## 2023-06-29 NOTE — Patient Instructions (Signed)
 Increase Mounjaro  5 mg weekly  Continue Metformin  500g XR , 2 tablets with breakfast and Supper  Decrease Lantus  32 units once daily    HOW TO TREAT LOW BLOOD SUGARS (Blood sugar LESS THAN 70 MG/DL) Please follow the RULE OF 15 for the treatment of hypoglycemia treatment (when your (blood sugars are less than 70 mg/dL)   STEP 1: Take 15 grams of carbohydrates when your blood sugar is low, which includes:  3-4 GLUCOSE TABS  OR 3-4 OZ OF JUICE OR REGULAR SODA OR ONE TUBE OF GLUCOSE GEL    STEP 2: RECHECK blood sugar in 15 MINUTES STEP 3: If your blood sugar is still low at the 15 minute recheck --> then, go back to STEP 1 and treat AGAIN with another 15 grams of carbohydrates.

## 2023-06-30 ENCOUNTER — Ambulatory Visit: Payer: Self-pay | Admitting: Internal Medicine

## 2023-06-30 LAB — BASIC METABOLIC PANEL WITH GFR
BUN: 11 mg/dL (ref 7–25)
CO2: 26 mmol/L (ref 20–32)
Calcium: 9.4 mg/dL (ref 8.6–10.2)
Chloride: 105 mmol/L (ref 98–110)
Creat: 0.51 mg/dL (ref 0.50–0.99)
Glucose, Bld: 123 mg/dL — ABNORMAL HIGH (ref 65–99)
Potassium: 4.7 mmol/L (ref 3.5–5.3)
Sodium: 139 mmol/L (ref 135–146)
eGFR: 117 mL/min/{1.73_m2} (ref >=60)

## 2023-06-30 LAB — LIPID PANEL
Cholesterol: 142 mg/dL (ref <200)
HDL: 38 mg/dL — ABNORMAL LOW (ref >=50)
LDL Cholesterol (Calc): 79 mg/dL
Non-HDL Cholesterol (Calc): 104 mg/dL (ref <130)
Total CHOL/HDL Ratio: 3.7 (calc) (ref <5.0)
Triglycerides: 156 mg/dL — ABNORMAL HIGH (ref <150)

## 2023-06-30 LAB — MICROALBUMIN / CREATININE URINE RATIO
Creatinine, Urine: 119 mg/dL (ref 20–275)
Microalb Creat Ratio: 20 mg/g{creat} (ref <30)
Microalb, Ur: 2.4 mg/dL

## 2023-06-30 MED ORDER — ROSUVASTATIN CALCIUM 20 MG PO TABS: 20.0000 mg | ORAL_TABLET | Freq: Every day | ORAL | 3 refills | Status: AC

## 2023-07-07 ENCOUNTER — Other Ambulatory Visit: Payer: Self-pay | Admitting: Internal Medicine

## 2023-07-20 ENCOUNTER — Ambulatory Visit: Payer: Self-pay

## 2023-07-20 NOTE — Telephone Encounter (Signed)
 Noted

## 2023-07-20 NOTE — Telephone Encounter (Signed)
 FYI Only or Action Required?: FYI only for provider.  Patient was last seen in primary care on 11/25/2022 by Lorren Greig PARAS, NP.  Called Nurse Triage reporting Leg Swelling and Arm Swelling.  Symptoms began today.  Interventions attempted: Rest, hydration, or home remedies.  Symptoms are: gradually worsening.  Triage Disposition: See Physician Within 24 Hours  Patient/caregiver understands and will follow disposition?: Yes  Copied from CRM [id number]. Topic: Clinical - Red Word Triage >> Jul 20, 2023 10:16 AM Cleave MATSU wrote: Red Word that prompted transfer to Nurse Triage: pt is having swelling in arms and legs. Reason for Disposition  [1] MODERATE leg swelling (e.g., swelling extends up to knees) AND [2] new-onset or worsening  Answer Assessment - Initial Assessment Questions 1. ONSET: When did the swelling start? (e.g., minutes, hours, days)     Chronic but worsening 2. LOCATION: What part of the leg is swollen?  Are both legs swollen or just one leg?     Bilateral  3. SEVERITY: How bad is the swelling? (e.g., localized; mild, moderate, severe)   - Localized: Small area of swelling localized to one leg.   - MILD pedal edema: Swelling limited to foot and ankle, pitting edema < 1/4 inch (6 mm) deep, rest and elevation eliminate most or all swelling.   - MODERATE edema: Swelling of lower leg to knee, pitting edema > 1/4 inch (6 mm) deep, rest and elevation only partially reduce swelling.   - SEVERE edema: Swelling extends above knee, facial or hand swelling present.      Moderate up to knee 4. REDNESS: Does the swelling look red or infected?     Denies  5. PAIN: Is the swelling painful to touch? If Yes, ask: How painful is it?   (Scale 1-10; mild, moderate or severe)     Moderate 6. FEVER: Do you have a fever? If Yes, ask: What is it, how was it measured, and when did it start?      Denies  7. CAUSE: What do you think is causing the leg swelling?     Heat   8. MEDICAL HISTORY: Do you have a history of blood clots (e.g., DVT), cancer, heart failure, kidney disease, or liver failure?      9. RECURRENT SYMPTOM: Have you had leg swelling before? If Yes, ask: When was the last time? What happened that time?     Yes 10. OTHER SYMPTOMS: Do you have any other symptoms? (e.g., chest pain, difficulty breathing)       New arm swelling  Protocols used: Leg Swelling and Edema-A-AH

## 2023-07-21 ENCOUNTER — Encounter: Payer: Self-pay | Admitting: Family

## 2023-07-21 ENCOUNTER — Ambulatory Visit (INDEPENDENT_AMBULATORY_CARE_PROVIDER_SITE_OTHER): Admitting: Family

## 2023-07-21 VITALS — BP 137/88 | HR 87 | Temp 98.1°F | Resp 16 | Ht 62.5 in | Wt 167.4 lb

## 2023-07-21 DIAGNOSIS — F419 Anxiety disorder, unspecified: Secondary | ICD-10-CM | POA: Diagnosis not present

## 2023-07-21 DIAGNOSIS — M7989 Other specified soft tissue disorders: Secondary | ICD-10-CM | POA: Diagnosis not present

## 2023-07-21 DIAGNOSIS — F32A Depression, unspecified: Secondary | ICD-10-CM | POA: Diagnosis not present

## 2023-07-21 MED ORDER — FLUOXETINE HCL 20 MG PO TABS: 20.0000 mg | ORAL_TABLET | Freq: Every day | ORAL | 0 refills | Status: DC

## 2023-07-21 NOTE — Progress Notes (Signed)
 Yesterday left arm and leg swelling,patient was dizzy because her glucose drop really low, Patient scored a 17 on GAD-7

## 2023-07-21 NOTE — Progress Notes (Signed)
 Patient ID: [name] [name], female    DOB: October 20, 1980  MRN: [id number]  CC: Chronic Conditions Follow-Up  Subjective: [name] [name] is a 43 y.o. female who presents for chronic conditions follow-up. She is accompanied by her son.  Her concerns today include:  - Intermittent left leg swelling. Denies red flag symptoms. States wears compression stockings to help. Reports she is established with [facility]. - States left arm swelling only on yesterday. Denies red flag symptoms. States thinks related to she was upset at work. - Doing well on Fluoxetine , no issues/concerns. She denies thoughts of self-harm, suicidal ideations, homicidal ideations. - Established with Endocrinology for diabetes and related chronic conditions management.   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   Hypertensive retinopathy of both eyes 11/08/2018   Nuclear age-related cataract, both eyes 11/08/2018   MDD (major depressive disorder), recurrent episode, moderate (HCC) 08/04/2018   PTSD (post-traumatic stress disorder) 08/04/2018   MDD (major depressive disorder), severe (HCC) 07/12/2018   GERD (gastroesophageal reflux disease) 02/17/2018   Type 2 diabetes mellitus without complication, without long-term current use of insulin  (HCC) 02/17/2018   S/P cesarean section 06/01/2014   S/P tubal ligation 06/01/2014   H/O cesarean section complicating pregnancy 04/18/2014   Emotional disorder (HCC) 03/16/2013   Tobacco user 03/16/2013   Acute chest pain 03/01/2013   HTN (hypertension) 09/28/2012   DM (diabetes mellitus) (HCC) 11/06/2011     Current Outpatient Medications on File Prior to Visit  Medication Sig Dispense Refill   acetaminophen  (TYLENOL ) 500 MG tablet Take 500-1,000 mg by mouth every 6 (six)  hours as needed (pain.).     aspirin  EC 81 MG tablet Take 1 tablet (81 mg total) by mouth daily at 6 (six) AM. Swallow whole. 30 tablet 12   Blood Glucose Monitoring Suppl (ACCU-CHEK GUIDE) w/Device KIT 1 each by Other route 4 (four) times daily -  before meals and at bedtime. 1 kit 0   gabapentin  (NEURONTIN ) 300 MG capsule TAKE 1 CAPSULE BY MOUTH THREE TIMES DAILY 270 capsule 0   glucose blood (ACCU-CHEK GUIDE) test strip Use as instructed 100 each 12   Insulin  Pen Needle 32G X 4 MM MISC 1 Device by Does not apply route daily in the afternoon. 100 each 3   Lancets (ACCU-CHEK MULTICLIX) lancets Use as instructed 100 each 12   lisinopril  (ZESTRIL ) 40 MG tablet Take 1 tablet by mouth once daily 90 tablet 0   metFORMIN  (GLUCOPHAGE -XR) 500 MG 24 hr tablet Take 2 tablets (1,000 mg total) by mouth 2 (two) times daily with a meal. 360 tablet 3   methocarbamol  (ROBAXIN ) 500 MG tablet Take 1 tablet (500 mg total) by mouth 2 (two) times daily as needed for muscle spasms. 20 tablet 0   ondansetron  (ZOFRAN ) 4 MG tablet Take 1 tablet (4 mg total) by mouth every 8 (eight) hours as needed for nausea or vomiting. 20 tablet 0   rivaroxaban  (XARELTO ) 20 MG TABS tablet Take 1 tablet (20 mg total) by mouth daily with supper. 90 tablet 3   rosuvastatin  (CRESTOR ) 20 MG tablet Take 1 tablet (20 mg total) by mouth daily. 90 tablet 3   tirzepatide  (MOUNJARO ) 5 MG/0.5ML Pen Inject 5 mg into the skin once a week. 6 mL 3   insulin  glargine-yfgn (SEMGLEE )  100 UNIT/ML Pen Inject 30 Units into the skin at bedtime. (Patient taking differently: Inject 36 Units into the skin at bedtime.) 30 mL 4   lisinopril  (ZESTRIL ) 40 MG tablet Take 1 tablet (40 mg total) by mouth daily. (Patient not taking: Reported on 07/21/2023) 90 tablet 0   No current facility-administered medications on file prior to visit.    Allergies  Allergen Reactions   Sulfa Antibiotics Hives    Social History   Socioeconomic History   Marital status: Legally  Separated    Spouse name: Not on file   Number of children: Not on file   Years of education: Not on file   Highest education level: GED or equivalent  Occupational History   Not on file  Tobacco Use   Smoking status: Former    Current packs/day: 0.00    Average packs/day: 1 pack/day for 12.0 years (12.0 ttl pk-yrs)    Types: Cigarettes    Start date: 06/09/2010    Quit date: 06/09/2022    Years since quitting: 1.1    Passive exposure: Current   Smokeless tobacco: Never  Vaping Use   Vaping status: Never Used  Substance and Sexual Activity   Alcohol use: No   Drug use: No   Sexual activity: Yes    Birth control/protection: None  Other Topics Concern   Not on file  Social History Narrative   Not on file   Social Drivers of Health   Financial Resource Strain: Medium Risk (07/20/2023)   Overall Financial Resource Strain (CARDIA)    Difficulty of Paying Living Expenses: Somewhat hard  Food Insecurity: Food Insecurity Present (07/20/2023)   Hunger Vital Sign    Worried About Running Out of Food in the Last Year: Sometimes true    Ran Out of Food in the Last Year: Never true  Transportation Needs: No Transportation Needs (07/20/2023)   PRAPARE - Administrator, Civil Service (Medical): No    Lack of Transportation (Non-Medical): No  Physical Activity: Sufficiently Active (07/21/2023)   Exercise Vital Sign    Days of Exercise per Week: 7 days    Minutes of Exercise per Session: 30 min  Stress: Stress Concern Present (07/21/2023)   [employer] of Occupational Health - Occupational Stress Questionnaire    Feeling of Stress: Very much  Social Connections: Unknown (07/21/2023)   Social Connection and Isolation Panel    Frequency of Communication with Friends and Family: Patient declined    Frequency of Social Gatherings with Friends and Family: Patient declined    Attends Religious Services: Patient declined    Active Member of Clubs or Organizations: No    Attends Occupational hygienist Meetings: Never    Marital Status: Divorced  Catering manager Violence: Not At Risk (07/21/2023)   Humiliation, Afraid, Rape, and Kick questionnaire    Fear of Current or Ex-Partner: No    Emotionally Abused: No    Physically Abused: No    Sexually Abused: No    Family History  Problem Relation Age of Onset   Diabetes Maternal Grandmother    Diabetes Paternal Grandmother     Past Surgical History:  Procedure Laterality Date   APPLICATION OF WOUND VAC Right 05/13/2022   Procedure: APPLICATION OF PREVENA WOUND VAC;  Surgeon: Serene Gaile ORN, MD;  Location: [facility] OR;  Service: Vascular;  Laterality: Right;   APPLICATION OF WOUND VAC Right 06/10/2022   Procedure: APPLICATION OF WOUND VAC;  Surgeon: Lanis Fonda BRAVO, MD;  Location: [facility] OR;  Service: Vascular;  Laterality: Right;   CESAREAN SECTION     CESAREAN SECTION WITH BILATERAL TUBAL LIGATION Bilateral 06/01/2014   Procedure: CESAREAN SECTION WITH BILATERAL TUBAL LIGATION;  Surgeon: Ezzie Buba, MD;  Location: [facility] ORS;  Service: Obstetrics;  Laterality: Bilateral;  MD requests RNFA   CHEST TUBE INSERTION     FEMORAL ARTERY EXPLORATION Right 05/13/2022   Procedure: RIGHTGROIN EXPLORATION , HEMATOMA EVACUATION;  Surgeon: Serene Gaile ORN, MD;  Location: [facility] OR;  Service: Vascular;  Laterality: Right;   FEMORAL-POPLITEAL BYPASS GRAFT Right 05/07/2022   Procedure: RIGHT FEMORAL-POPLITEAL ARTERY EMBOLECTOMY AND 4  COMPARMENT FASCIOTOMY;  Surgeon: Lanis Fonda BRAVO, MD;  Location: [facility] OR;  Service: Vascular;  Laterality: Right;   TONSILLECTOMY     TRACHEOSTOMY     WOUND EXPLORATION Right 06/10/2022   Procedure: IRRGATION AND DEBRIDMENT OF RIGHT GROIN;  Surgeon: Lanis Fonda BRAVO, MD;  Location: [facility] OR;  Service: Vascular;  Laterality: Right;    ROS: Review of Systems Negative except as stated above  PHYSICAL EXAM: BP 137/88   Pulse 87   Temp 98.1 F (36.7 C) (Oral)   Resp 16   Ht 5' 2.5 (1.588 m)   Wt 167 lb 6.4 oz  (75.9 kg)   LMP 07/15/2023 (Exact Date)   SpO2 96%   BMI 30.13 kg/m   Physical Exam HENT:     Head: Normocephalic and atraumatic.     Nose: Nose normal.     Mouth/Throat:     Mouth: Mucous membranes are moist.     Pharynx: Oropharynx is clear.  Eyes:     Extraocular Movements: Extraocular movements intact.     Conjunctiva/sclera: Conjunctivae normal.     Pupils: Pupils are equal, round, and reactive to light.  Cardiovascular:     Rate and Rhythm: Normal rate and regular rhythm.     Pulses: Normal pulses.     Heart sounds: Normal heart sounds.  Pulmonary:     Effort: Pulmonary effort is normal.     Breath sounds: Normal breath sounds.  Musculoskeletal:        General: Normal range of motion.     Right shoulder: Normal.     Left shoulder: Normal.     Right upper arm: Normal.     Left upper arm: Normal.     Right elbow: Normal.     Left elbow: Normal.     Right forearm: Normal.     Left forearm: Normal.     Right wrist: Normal.     Left wrist: Normal.     Right hand: Normal.     Left hand: Normal.     Cervical back: Normal range of motion and neck supple.     Right hip: Normal.     Left hip: Normal.     Right upper leg: Normal.     Left upper leg: Normal.     Right knee: Normal.     Left knee: Normal.     Right lower leg: Normal.     Left lower leg: Normal.     Right ankle: Normal.     Left ankle: Normal.     Right foot: Normal.     Left foot: Normal.  Neurological:     General: No focal deficit present.     Mental Status: She is alert and oriented to person, place, and time.  Psychiatric:        Mood and Affect: Mood normal.  Behavior: Behavior normal.     ASSESSMENT AND PLAN: 1. Leg swelling (Primary) - Vascular Lower Extremity Venous for evaluation. - Keep all scheduled appointments with [facility]. - VAS US  LOWER EXTREMITY VENOUS (DVT); Future  2. Anxiety and depression - Patient denies thoughts of self-harm, suicidal ideations,  homicidal ideations. - Continue Fluoxetine  as prescribed. Counseled on medication adherence/adverse effects. - Follow-up with primary provider in 3 months or sooner if needed.  - FLUoxetine  (PROZAC ) 20 MG tablet; Take 1 tablet (20 mg total) by mouth daily.  Dispense: 90 tablet; Refill: 0   Patient was given the opportunity to ask questions.  Patient verbalized understanding of the plan and was able to repeat key elements of the plan. Patient was given clear instructions to go to Emergency Department or return to medical center if symptoms don't improve, worsen, or new problems develop.The patient verbalized understanding.   Orders Placed This Encounter  Procedures   VAS US  LOWER EXTREMITY VENOUS (DVT)     Requested Prescriptions   Signed Prescriptions Disp Refills   FLUoxetine  (PROZAC ) 20 MG tablet 90 tablet 0    Sig: Take 1 tablet (20 mg total) by mouth daily.    Follow-up with primary provider as scheduled.  Greig JINNY Drones, NP

## 2023-07-27 ENCOUNTER — Ambulatory Visit: Payer: Self-pay | Admitting: Family

## 2023-07-27 ENCOUNTER — Ambulatory Visit (HOSPITAL_COMMUNITY)
Admission: RE | Admit: 2023-07-27 | Discharge: 2023-07-27 | Disposition: A | Discharge status: Home / Self Care | Source: Ambulatory Visit | Attending: Vascular Surgery | Admitting: Vascular Surgery

## 2023-07-27 DIAGNOSIS — M7989 Other specified soft tissue disorders: Secondary | ICD-10-CM | POA: Insufficient documentation

## 2023-10-10 IMAGING — DX DG ELBOW COMPLETE 3+V*R*
4 series · 4 of 4 positions shown · non-contrast
Comparison: None.

CLINICAL DATA: Right elbow pain.

EXAM:
RIGHT ELBOW - COMPLETE 3+ VIEW

[elbow ap (1 of 2)]
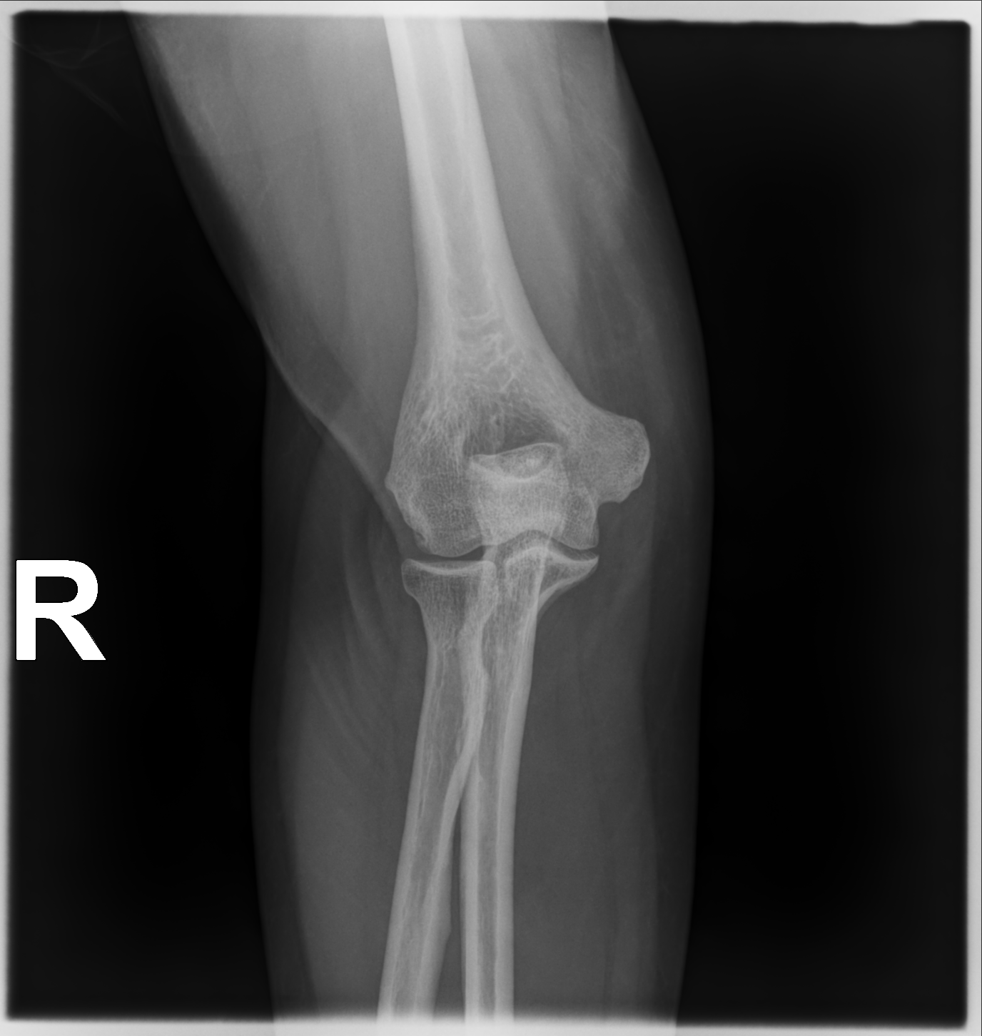

[elbow lat (1 of 2)]
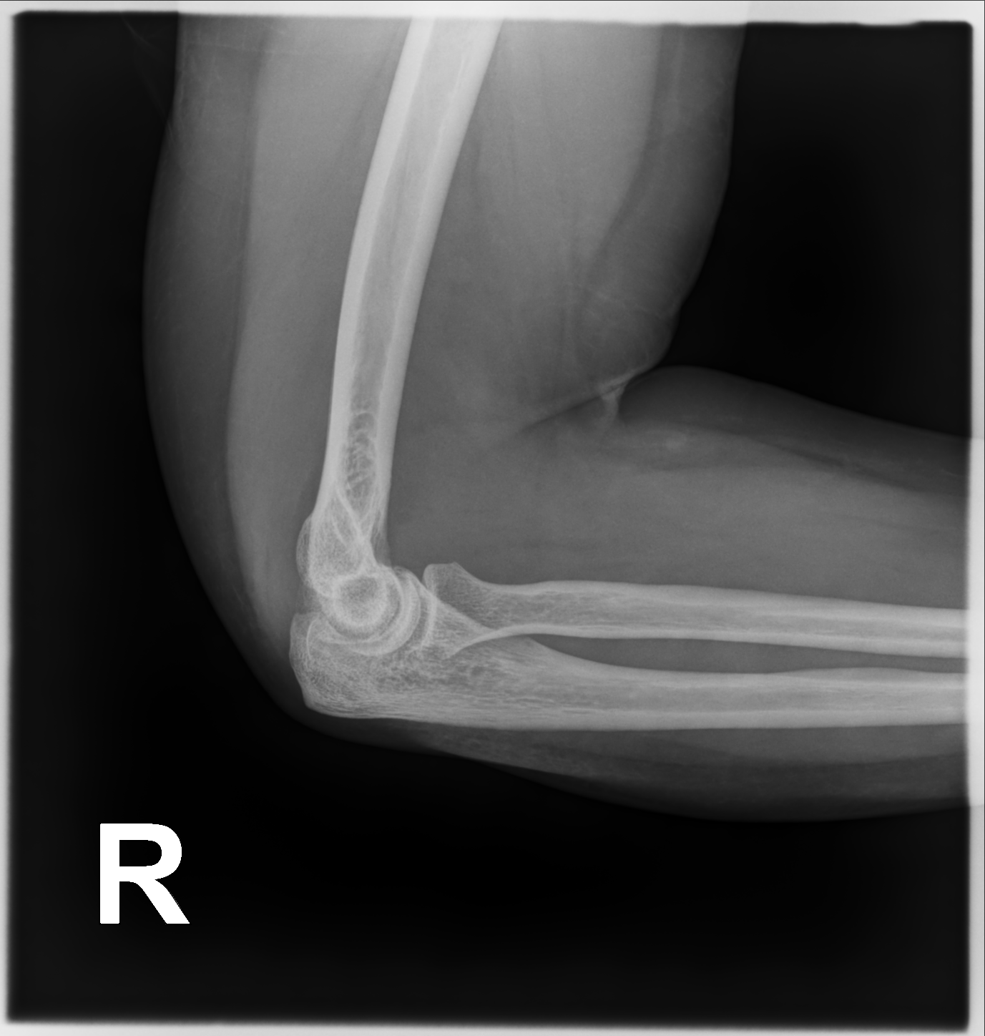

[elbow ap (2 of 2)]
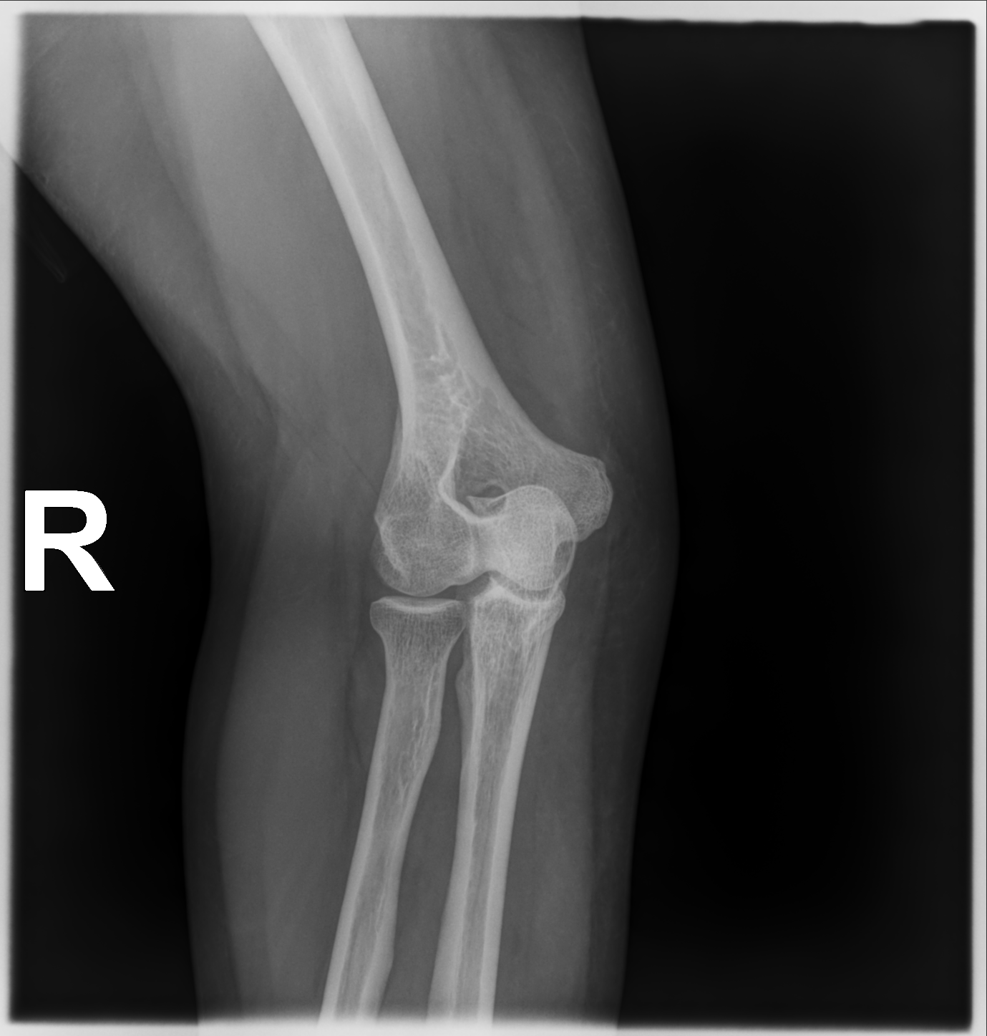

[elbow lat (2 of 2)]
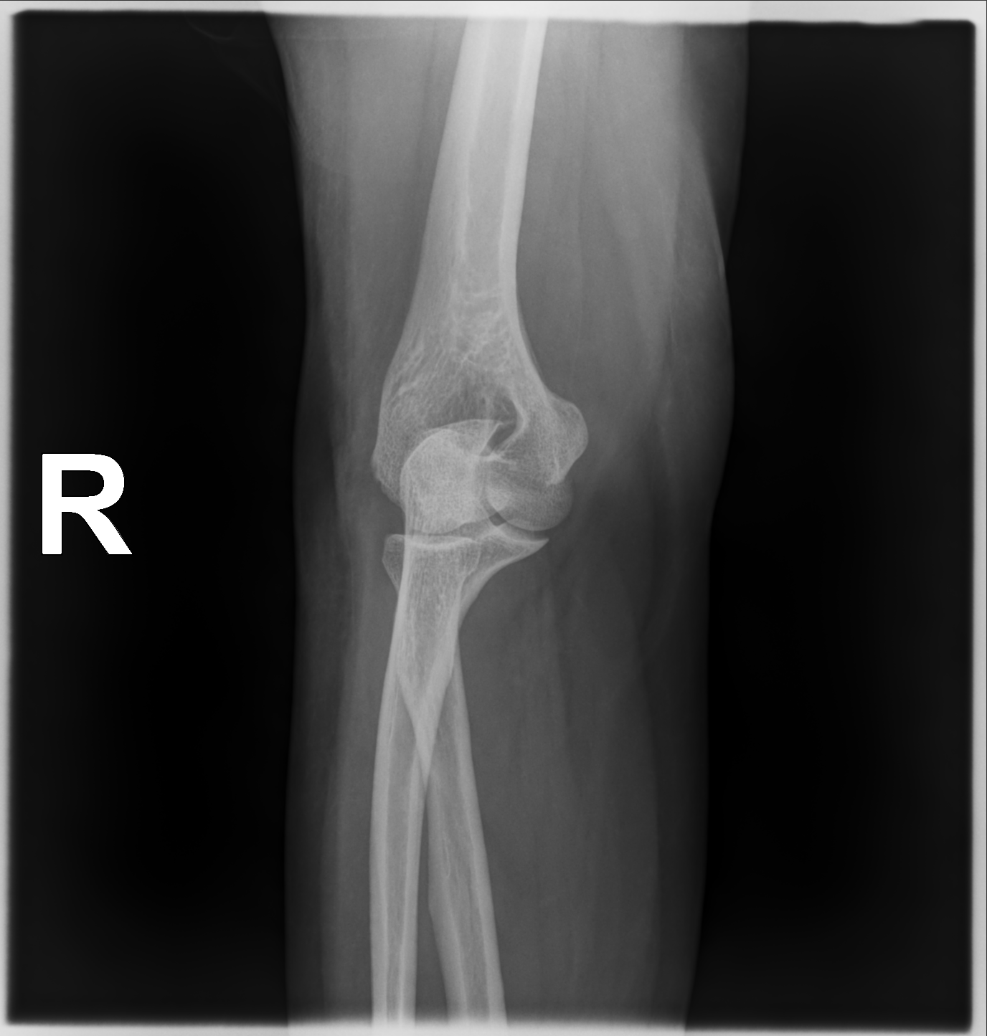

[4 of 4 positions shown; findings below may reference images not displayed]

FINDINGS: No joint effusion. No fracture or dislocation identified. No
significant arthropathy. Soft tissues are unremarkable.
IMPRESSION: Negative.

## 2023-11-12 ENCOUNTER — Telehealth: Payer: Self-pay | Admitting: Pharmacy Technician

## 2023-11-12 NOTE — Telephone Encounter (Signed)
 Pharmacy Patient Advocate Encounter   Received notification from CoverMyMeds that prior authorization for Mounjaro  2.5MG /0.5ML auto-injectors is due for renewal.   Insurance verification completed.   The patient is insured through [facility].  Action: Medication has been discontinued. Archived Key: [id number]  **Patient is on a higher dose.**

## 2023-12-20 ENCOUNTER — Ambulatory Visit: Payer: Self-pay

## 2023-12-20 ENCOUNTER — Ambulatory Visit
Admission: RE | Admit: 2023-12-20 | Discharge: 2023-12-20 | Disposition: A | Discharge status: Home / Self Care | Source: Ambulatory Visit

## 2023-12-20 VITALS — BP 162/102 | HR 80 | Temp 97.7°F | Resp 18 | Wt 167.3 lb

## 2023-12-20 DIAGNOSIS — G518 Other disorders of facial nerve: Secondary | ICD-10-CM

## 2023-12-20 MED ORDER — KETOROLAC TROMETHAMINE 15 MG/ML IJ SOLN
30.0000 mg | Freq: Once | INTRAMUSCULAR | Status: AC
  Administered 2023-12-20: 30 mg via INTRAMUSCULAR

## 2023-12-20 MED ORDER — DEXAMETHASONE SOD PHOSPHATE PF 10 MG/ML IJ SOLN
10.0000 mg | Freq: Once | INTRAMUSCULAR | Status: AC
  Administered 2023-12-20: 10 mg via INTRAMUSCULAR

## 2023-12-20 MED ORDER — PREDNISONE 10 MG (21) PO TBPK: ORAL_TABLET | ORAL | 0 refills | Status: AC

## 2023-12-20 NOTE — Telephone Encounter (Signed)
 FYI Only or Action Required?: FYI only for provider: pt could not come to appt offered today, pt was assisted in scheduling at the [facility] near her PCP office. Pt will be seen there today and was advised to go to ED if s/s worsen.  Patient was last seen in primary care on 07/21/2023 by Jaycee Greig PARAS, NP.  Called Nurse Triage reporting Headache.  Symptoms began several days ago.  Interventions attempted: OTC medications: aleve.  Symptoms are: gradually worsening.  Triage Disposition: See HCP Within 4 Hours (Or PCP Triage)  Patient/caregiver understands and will follow disposition?: Yes, will follow disposition  Copied from CRM [id number]. Topic: Clinical - Red Word Triage >> Dec 20, 2023 11:55 AM Myrick T wrote: Red Word that prompted transfer to Nurse Triage: patient said she is having excruciating pain in her head down into her neck and left shoulder. Reason for Disposition  [1] SEVERE headache (e.g., excruciating) AND [2] not improved after 2 hours of pain medicine  Answer Assessment - Initial Assessment Questions 1. LOCATION: Where does it hurt?      L side of head 2. ONSET: When did the headache start? (e.g., minutes, hours, days)      Started about 4 days ago, getting worse with time 3. PATTERN: Does the pain come and go, or has it been constant since it started?     constant 4. SEVERITY: How bad is the pain? and What does it keep you from doing?  (e.g., Scale 1-10; mild, moderate, or severe)     10 5. RECURRENT SYMPTOM: Have you ever had headaches before? If Yes, ask: When was the last time? and What happened that time?      Denies having had this before 6. CAUSE: What do you think is causing the headache?     unsure 7. MIGRAINE: Have you been diagnosed with migraine headaches? If Yes, ask: Is this headache similar?  States that she has a hx, states that she does not have medication 8. HEAD INJURY: Has there been any recent injury to your head?       denies 9. OTHER SYMPTOMS: Do you have any other symptoms? (e.g., fever, stiff neck, eye pain, sore throat, cold symptoms)     Denies fever, denies stiff neck, states that pain in neck in shoulder is nerve pain states that clothing touches her hurts 10. PREGNANCY: Is there any chance you are pregnant? When was your last menstrual period?       Denies  Denies shingles or chicken pox exposure. States there is redness and swelling in jaw area to the neck. Denies tooth pain. Denies jaw pain.  Protocols used: [facility]

## 2023-12-20 NOTE — Discharge Instructions (Addendum)
 You have been prescribed steroids for your illness and you have diabetes.  Steroids may raise your blood sugar, so check blood sugar more frequently.  If your blood sugar gets to 400 or above you need to report to the ER immediately.

## 2023-12-20 NOTE — ED Triage Notes (Addendum)
 Pt presents c/o neck pain, head pain, and shoulder pain x 4 days. Pt states,  It's kinda hard to describe. So I have this twitch in my face that has always been there but the last 4 days the pain has intensified. The pain is now radiating from my face down to my shoulder. It's just the whole left side of my head, neck, and shoulder is in excruciating pain. Just my clothes touching my skins burns. Pt denies falls and/or injury.

## 2023-12-20 NOTE — Telephone Encounter (Signed)
 Noted

## 2023-12-21 NOTE — ED Provider Notes (Signed)
 [name]    CSN: [id number] Arrival date & time: 12/20/23  1721      History   Chief Complaint Chief Complaint  Patient presents with   Headache    Head, neck, shoulder pain, denies injury - Entered by patient    HPI [name] [name] is a 43 y.o. female.   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 Pt states that she has been using gabapentin  with mild relief but it causes fatigue so she is unable to take it during the day.   The history is provided by the patient.  Headache   Past Medical History:  Diagnosis Date   Anxiety    Back fracture    Bipolar disorder (HCC)    Depression    Diabetes mellitus without complication (HCC)    Family history of adverse reaction to anesthesia    sister gets nausea and vomitting and itching on her face. Around her nose and mouth.   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   Hypertensive retinopathy of both eyes 11/08/2018   Nuclear age-related cataract, both eyes 11/08/2018   MDD (major depressive disorder), recurrent episode, moderate (HCC) 08/04/2018   PTSD (post-traumatic stress disorder) 08/04/2018   MDD (major depressive disorder), severe (HCC) 07/12/2018   GERD (gastroesophageal reflux disease) 02/17/2018   Type 2 diabetes mellitus without complication, without long-term current use of  insulin  (HCC) 02/17/2018   S/P cesarean section 06/01/2014   S/P tubal ligation 06/01/2014   H/O cesarean section complicating pregnancy 04/18/2014   Emotional disorder 03/16/2013   Tobacco user 03/16/2013   Acute chest pain 03/01/2013   HTN (hypertension) 09/28/2012   DM (diabetes mellitus) (HCC) 11/06/2011    Past Surgical History:  Procedure Laterality Date   APPLICATION OF WOUND VAC Right 05/13/2022   Procedure: APPLICATION OF PREVENA WOUND VAC;  Surgeon: Serene Gaile ORN, MD;  Location: [facility] OR;  Service: Vascular;  Laterality: Right;   APPLICATION OF WOUND VAC Right 06/10/2022   Procedure: APPLICATION OF WOUND VAC;  Surgeon: Lanis Fonda BRAVO, MD;  Location: [facility] OR;  Service: Vascular;  Laterality: Right;   CESAREAN SECTION     CESAREAN SECTION WITH BILATERAL TUBAL LIGATION Bilateral 06/01/2014   Procedure: CESAREAN SECTION WITH BILATERAL TUBAL LIGATION;  Surgeon: Ezzie Buba, MD;  Location: [facility];  Service: Obstetrics;  Laterality: Bilateral;  MD requests RNFA   CHEST TUBE INSERTION     FEMORAL ARTERY EXPLORATION Right 05/13/2022   Procedure: RIGHTGROIN EXPLORATION , HEMATOMA EVACUATION;  Surgeon: Serene Gaile ORN, MD;  Location: [facility] OR;  Service: Vascular;  Laterality: Right;   FEMORAL-POPLITEAL BYPASS GRAFT Right 05/07/2022   Procedure: RIGHT FEMORAL-POPLITEAL ARTERY EMBOLECTOMY AND 4  COMPARMENT FASCIOTOMY;  Surgeon: Lanis Fonda BRAVO, MD;  Location: [facility] OR;  Service: Vascular;  Laterality: Right;  TONSILLECTOMY     TRACHEOSTOMY     WOUND EXPLORATION Right 06/10/2022   Procedure: IRRGATION AND DEBRIDMENT OF RIGHT GROIN;  Surgeon: Lanis Fonda BRAVO, MD;  Location: [facility] OR;  Service: Vascular;  Laterality: Right;    OB History     Gravida  2   Para  2   Term  2   Preterm      AB      Living  2      SAB      IAB      Ectopic      Multiple  0   Live Births  2            Home Medications    Prior to Admission medications   Medication Sig Start Date End  Date Taking? Authorizing Provider  lisinopril  (ZESTRIL ) 40 MG tablet Take 1 tablet (40 mg total) by mouth daily. 10/28/22  Yes Jaycee, Amy J, NP  metFORMIN  (GLUCOPHAGE -XR) 500 MG 24 hr tablet Take 2 tablets (1,000 mg total) by mouth 2 (two) times daily with a meal. 02/23/23  Yes Shamleffer, Donell Cardinal, MD  predniSONE  (STERAPRED UNI-PAK 21 TAB) 10 MG (21) TBPK tablet Take as directed on back of package 12/20/23  Yes Andra Krabbe C, PA-C  acetaminophen  (TYLENOL ) 500 MG tablet Take 500-1,000 mg by mouth every 6 (six) hours as needed (pain.).    [provider]  aspirin  EC 81 MG tablet Take 1 tablet (81 mg total) by mouth daily at 6 (six) AM. Swallow whole. 05/10/22   Baglia, Corrina, PA-C  Blood Glucose Monitoring Suppl (ACCU-CHEK GUIDE) w/Device KIT 1 each by Other route 4 (four) times daily -  before meals and at bedtime. 08/06/21   Jaycee Greig PARAS, NP  FLUoxetine  (PROZAC ) 20 MG tablet Take 1 tablet (20 mg total) by mouth daily. 07/21/23   Jaycee Greig PARAS, NP  gabapentin  (NEURONTIN ) 300 MG capsule TAKE 1 CAPSULE BY MOUTH THREE TIMES DAILY 04/05/23   Jaycee Greig PARAS, NP  glucose blood (ACCU-CHEK GUIDE) test strip Use as instructed 08/06/21   Jaycee Greig PARAS, NP  insulin  glargine-yfgn (SEMGLEE ) 100 UNIT/ML Pen Inject 30 Units into the skin at bedtime. Patient taking differently: Inject 36 Units into the skin at bedtime. 02/23/23   Shamleffer, Ibtehal Jaralla, MD  Insulin  Pen Needle 32G X 4 MM MISC 1 Device by Does not apply route daily in the afternoon. 02/23/23   Shamleffer, Ibtehal Jaralla, MD  Lancets (ACCU-CHEK MULTICLIX) lancets Use as instructed 08/06/21   Jaycee Greig PARAS, NP  lisinopril  (ZESTRIL ) 40 MG tablet Take 1 tablet by mouth once daily 10/28/22   Jaycee Greig PARAS, NP  methocarbamol  (ROBAXIN ) 500 MG tablet Take 1 tablet (500 mg total) by mouth 2 (two) times daily as needed for muscle spasms. 10/22/22   Hazen Darryle BRAVO, FNP  ondansetron  (ZOFRAN ) 4 MG tablet Take 1 tablet (4 mg total) by mouth  every 8 (eight) hours as needed for nausea or vomiting. 02/23/23   Shamleffer, Donell Cardinal, MD  rivaroxaban  (XARELTO ) 20 MG TABS tablet Take 1 tablet (20 mg total) by mouth daily with supper. 07/30/22   Lanis Fonda BRAVO, MD  rosuvastatin  (CRESTOR ) 20 MG tablet Take 1 tablet (20 mg total) by mouth daily. 06/30/23   Shamleffer, Ibtehal Jaralla, MD  tirzepatide  (MOUNJARO ) 5 MG/0.5ML Pen Inject 5 mg into the skin once a week. 06/29/23   Shamleffer, Donell Cardinal, MD    Family History Family History  Problem Relation Age of  Onset   Diabetes Maternal Grandmother    Diabetes Paternal Grandmother     Social History Social History   Tobacco Use   Smoking status: Former    Current packs/day: 0.00    Average packs/day: 1 pack/day for 12.0 years (12.0 ttl pk-yrs)    Types: Cigarettes    Start date: 06/09/2010    Quit date: 06/09/2022    Years since quitting: 1.5    Passive exposure: Current   Smokeless tobacco: Never  Vaping Use   Vaping status: Never Used  Substance Use Topics   Alcohol use: No   Drug use: No     Allergies   Sulfa antibiotics   Review of Systems Review of Systems  Neurological:  Positive for headaches.     Physical Exam Triage Vital Signs ED Triage Vitals  Encounter Vitals Group     BP 12/20/23 1741 (!) 162/102     Girls Systolic BP Percentile --      Girls Diastolic BP Percentile --      Boys Systolic BP Percentile --      Boys Diastolic BP Percentile --      Pulse Rate 12/20/23 1741 80     Resp 12/20/23 1741 18     Temp 12/20/23 1741 97.7 F (36.5 C)     Temp Source 12/20/23 1741 Oral     SpO2 12/20/23 1741 96 %     Weight 12/20/23 1740 167 lb 5.3 oz (75.9 kg)     Height --      Head Circumference --      Peak Flow --      Pain Score 12/20/23 1738 10     Pain Loc --      Pain Education --      Exclude from Growth Chart --    No data found.  Updated Vital Signs BP (!) 162/102 (BP Location: Left Arm)   Pulse 80   Temp 97.7 F (36.5 C)  (Oral)   Resp 18   Wt 167 lb 5.3 oz (75.9 kg)   LMP 12/17/2023 (Exact Date)   SpO2 96%   BMI 30.12 kg/m   Visual Acuity Right Eye Distance:   Left Eye Distance:   Bilateral Distance:    Right Eye Near:   Left Eye Near:    Bilateral Near:     Physical Exam Vitals and nursing note reviewed.  Constitutional:      General: She is not in acute distress.    Appearance: Normal appearance. She is not ill-appearing, toxic-appearing or diaphoretic.  Eyes:     General: No scleral icterus. Cardiovascular:     Rate and Rhythm: Normal rate and regular rhythm.     Heart sounds: Normal heart sounds.  Pulmonary:     Effort: Pulmonary effort is normal. No respiratory distress.     Breath sounds: Normal breath sounds. No wheezing or rhonchi.  Skin:    General: Skin is warm.  Neurological:     Mental Status: She is alert and oriented to person, place, and time.     Cranial Nerves: Facial asymmetry present.     Comments: Left sided facial weakness and spasm  Psychiatric:        Mood and Affect: Mood normal.        Behavior: Behavior normal.      UC Treatments / Results  Labs (all labs ordered are listed, but only abnormal results are displayed) Labs Reviewed - No data to display  EKG  Radiology No results found.  Procedures Procedures (including critical care time)  Medications Ordered in UC Medications  ketorolac  (TORADOL ) 15 MG/ML injection 30 mg (30 mg Intramuscular Given 12/20/23 1819)  dexamethasone  (DECADRON ) injection 10 mg (10 mg Intramuscular Given 12/20/23 1821)    Initial Impression / Assessment and Plan / UC Course  I have reviewed the triage vital signs and the nursing notes.  Pertinent labs & imaging results that were available during my care of the patient were reviewed by me and considered in my medical decision making (see chart for details).     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  Steroids may raise your blood sugar, so check blood sugar more frequently.  If your blood sugar gets to 400 or above you need to report to the ER immediately.     ED Prescriptions     Medication Sig Dispense Auth. Provider   predniSONE  (STERAPRED UNI-PAK 21 TAB) 10 MG (21) TBPK tablet Take as directed on back of package 21 tablet Andra Corean BROCKS, PA-C      PDMP not reviewed this encounter.   Andra Corean BROCKS, PA-C 12/21/23 1548

## 2023-12-22 ENCOUNTER — Encounter: Payer: Self-pay | Admitting: Internal Medicine

## 2023-12-22 ENCOUNTER — Ambulatory Visit: Admitting: Internal Medicine

## 2023-12-22 VITALS — BP 112/78 | Ht 62.5 in | Wt 168.0 lb

## 2023-12-22 DIAGNOSIS — E785 Hyperlipidemia, unspecified: Secondary | ICD-10-CM

## 2023-12-22 DIAGNOSIS — R809 Proteinuria, unspecified: Secondary | ICD-10-CM

## 2023-12-22 DIAGNOSIS — E1141 Type 2 diabetes mellitus with diabetic mononeuropathy: Secondary | ICD-10-CM | POA: Diagnosis not present

## 2023-12-22 DIAGNOSIS — Z794 Long term (current) use of insulin: Secondary | ICD-10-CM

## 2023-12-22 DIAGNOSIS — E1129 Type 2 diabetes mellitus with other diabetic kidney complication: Secondary | ICD-10-CM | POA: Diagnosis not present

## 2023-12-22 DIAGNOSIS — E1151 Type 2 diabetes mellitus with diabetic peripheral angiopathy without gangrene: Secondary | ICD-10-CM | POA: Diagnosis not present

## 2023-12-22 DIAGNOSIS — E1142 Type 2 diabetes mellitus with diabetic polyneuropathy: Secondary | ICD-10-CM | POA: Diagnosis not present

## 2023-12-22 LAB — POCT GLYCOSYLATED HEMOGLOBIN (HGB A1C): Hemoglobin A1C: 6.3 % — AB (ref 4.0–5.6)

## 2023-12-22 MED ORDER — METFORMIN HCL ER 500 MG PO TB24: 2000.0000 mg | ORAL_TABLET | Freq: Every day | ORAL | 3 refills | Status: AC

## 2023-12-22 MED ORDER — INSULIN GLARGINE-YFGN 100 UNIT/ML ~~LOC~~ SOPN: 20.0000 [IU] | PEN_INJECTOR | Freq: Every day | SUBCUTANEOUS | 4 refills | Status: AC

## 2023-12-22 MED ORDER — INSULIN PEN NEEDLE 32G X 4 MM MISC: 1.0000 | Freq: Every day | 3 refills | Status: AC

## 2023-12-22 MED ORDER — ACCU-CHEK GUIDE W/DEVICE KIT: 1.0000 | PACK | Freq: Every day | 0 refills | Status: AC

## 2023-12-22 MED ORDER — DEXCOM G7 SENSOR MISC: 1.0000 | 3 refills | Status: AC

## 2023-12-22 MED ORDER — ACCU-CHEK GUIDE TEST VI STRP: 1.0000 | ORAL_STRIP | Freq: Every day | 12 refills | Status: AC

## 2023-12-22 MED ORDER — TIRZEPATIDE 7.5 MG/0.5ML ~~LOC~~ SOAJ: 7.5000 mg | SUBCUTANEOUS | 3 refills | Status: AC

## 2023-12-22 NOTE — Progress Notes (Signed)
 Name: [name] [name]  MRN/ DOB: [id number], Nov 22, 1980   Age/ Sex: 43 y.o., female    PCP: Jaycee Greig PARAS, NP   Reason for Endocrinology Evaluation: Type 2 Diabetes Mellitus     Date of Initial Endocrinology Visit: 11/09/2022    PATIENT IDENTIFIER: [name] [name] is a 43 y.o. female with a past medical history of DM, HTN, PAD, GERD. The patient presented for initial endocrinology clinic visit on 11/09/2022 for consultative assistance with her diabetes management.    HPI: Ms. Crewe was    Diagnosed with DM years ago  Prior Medications tried/Intolerance: Jardiance - insurance coverage. Trulicity  - nausea and vomiting , Ozempic - does not recall side effects         Hemoglobin A1c has ranged from 8.1% in 2020, peaking at 11.0% in 2023.   She works at [employer]   On her initial visit to our clinic she had an A1c of 10.1%, she was on metformin  and Semglee , and I started her on Mounjaro     SUBJECTIVE:   During the last visit (06/29/2023): A1c 6.8%     Today (12/22/23): [name] [name] is here for follow-up on diabetes management.  She checks her blood sugars 0 times daily. She broke her meter  She was evaluated by urgent care for facial pain, was diagnosed with neuralgia, she is on gabapentin  but it makes her drowsy.  She is on prednisone  taper, she understands this will increase her glucose She has been forgetting to forget the Metformin  due to busy schedule  Has minimal nausea , which has improved  Has constipation , has been staying hydrated     HOME DIABETES REGIMEN: Metformin  500 mg XR , 2 tabs BID Mounjaro  5 mg weekly Semglee  32 units daily      Statin: Yes ACE-I/ARB: Yes   METER DOWNLOAD SUMMARY: n/a  DIABETIC COMPLICATIONS: Microvascular complications:  Microalbuminuria , neuropathy  Denies: CKD Last eye exam: Completed   Macrovascular complications:  PVD Denies: CAD, CVA   PAST HISTORY: Past Medical History:  Past Medical History:   Diagnosis Date   Anxiety    Back fracture    Bipolar disorder (HCC)    Depression    Diabetes mellitus without complication (HCC)    Family history of adverse reaction to anesthesia    sister gets nausea and vomitting and itching on her face. Around her nose and mouth.   Fatty liver disease, nonalcoholic    GERD (gastroesophageal reflux disease)    H/O cesarean section complicating pregnancy 04/18/2014   Hypertension    Neuromuscular disorder (HCC)    eye twitch on left eye    Obesity    Pelvis fracture (HCC)    Pneumonia 2021   S/P cesarean section 06/01/2014   S/P tubal ligation 06/01/2014   Past Surgical History:  Past Surgical History:  Procedure Laterality Date   APPLICATION OF WOUND VAC Right 05/13/2022   Procedure: APPLICATION OF PREVENA WOUND VAC;  Surgeon: Serene Gaile ORN, MD;  Location: [facility] OR;  Service: Vascular;  Laterality: Right;   APPLICATION OF WOUND VAC Right 06/10/2022   Procedure: APPLICATION OF WOUND VAC;  Surgeon: Lanis Fonda BRAVO, MD;  Location[facility] OR;  Service: Vascular;  Laterality: Right;   CESAREAN SECTION     CESAREAN SECTION WITH BILATERAL TUBAL LIGATION Bilateral 06/01/2014   Procedure: CESAREAN SECTION WITH BILATERAL TUBAL LIGATION;  Surgeon: Ezzie Buba, MD;  Location: [facility];  Service: Obstetrics;  Laterality: Bilateral;  MD requests RNFA  CHEST TUBE INSERTION     FEMORAL ARTERY EXPLORATION Right 05/13/2022   Procedure: RIGHTGROIN EXPLORATION , HEMATOMA EVACUATION;  Surgeon: Serene Gaile ORN, MD;  Location: [facility] OR;  Service: Vascular;  Laterality: Right;   FEMORAL-POPLITEAL BYPASS GRAFT Right 05/07/2022   Procedure: RIGHT FEMORAL-POPLITEAL ARTERY EMBOLECTOMY AND 4  COMPARMENT FASCIOTOMY;  Surgeon: Lanis Fonda BRAVO, MD;  Location: [facility] OR;  Service: Vascular;  Laterality: Right;   TONSILLECTOMY     TRACHEOSTOMY     WOUND EXPLORATION Right 06/10/2022   Procedure: OSA AND DEBRIDMENT OF RIGHT GROIN;  Surgeon: Lanis Fonda BRAVO, MD;  Location: [facility]  OR;  Service: Vascular;  Laterality: Right;    Social History:  reports that she quit smoking about 18 months ago. Her smoking use included cigarettes. She started smoking about 13 years ago. She has a 12 pack-year smoking history. She has been exposed to tobacco smoke. She has never used smokeless tobacco. She reports that she does not drink alcohol and does not use drugs. Family History:  Family History  Problem Relation Age of Onset   Diabetes Maternal Grandmother    Diabetes Paternal Grandmother      HOME MEDICATIONS: Allergies as of 12/22/2023       Reactions   Sulfa Antibiotics Hives        Medication List        Accurate as of December 22, 2023  8:25 AM. If you have any questions, ask your nurse or doctor.          Accu-Chek Guide test strip Generic drug: glucose blood Use as instructed   Accu-Chek Guide w/Device Kit 1 each by Other route 4 (four) times daily -  before meals and at bedtime.   accu-chek multiclix lancets Use as instructed   acetaminophen  500 MG tablet Commonly known as: TYLENOL  Take 500-1,000 mg by mouth every 6 (six) hours as needed (pain.).   aspirin  EC 81 MG tablet Take 1 tablet (81 mg total) by mouth daily at 6 (six) AM. Swallow whole.   FLUoxetine  20 MG tablet Commonly known as: PROZAC  Take 1 tablet (20 mg total) by mouth daily.   gabapentin  300 MG capsule Commonly known as: NEURONTIN  TAKE 1 CAPSULE BY MOUTH THREE TIMES DAILY   insulin  glargine-yfgn 100 UNIT/ML Pen Commonly known as: SEMGLEE  Inject 30 Units into the skin at bedtime. What changed: how much to take   Insulin  Pen Needle 32G X 4 MM Misc 1 Device by Does not apply route daily in the afternoon.   lisinopril  40 MG tablet Commonly known as: ZESTRIL  Take 1 tablet by mouth once daily   lisinopril  40 MG tablet Commonly known as: ZESTRIL  Take 1 tablet (40 mg total) by mouth daily.   metFORMIN  500 MG 24 hr tablet Commonly known as: GLUCOPHAGE -XR Take 2 tablets  (1,000 mg total) by mouth 2 (two) times daily with a meal.   methocarbamol  500 MG tablet Commonly known as: ROBAXIN  Take 1 tablet (500 mg total) by mouth 2 (two) times daily as needed for muscle spasms.   ondansetron  4 MG tablet Commonly known as: Zofran  Take 1 tablet (4 mg total) by mouth every 8 (eight) hours as needed for nausea or vomiting.   predniSONE  10 MG (21) Tbpk tablet Commonly known as: STERAPRED UNI-PAK 21 TAB Take as directed on back of package   rivaroxaban  20 MG Tabs tablet Commonly known as: XARELTO  Take 1 tablet (20 mg total) by mouth daily with supper.   rosuvastatin  20 MG tablet Commonly  known as: CRESTOR  Take 1 tablet (20 mg total) by mouth daily.   tirzepatide  5 MG/0.5ML Pen Commonly known as: MOUNJARO  Inject 5 mg into the skin once a week.         ALLERGIES: Allergies  Allergen Reactions   Sulfa Antibiotics Hives     REVIEW OF SYSTEMS: A comprehensive ROS was conducted with the patient and is negative except as per HPI     OBJECTIVE:   VITAL SIGNS: BP 112/78   Ht 5' 2.5 (1.588 m)   Wt 168 lb (76.2 kg)   LMP 12/17/2023 (Exact Date)   BMI 30.24 kg/m      Filed Weights   12/22/23 0813  Weight: 168 lb (76.2 kg)    PHYSICAL EXAM:  General: Pt appears well and is in NAD  Lungs: Clear with good BS bilat   Heart: RRR   Abdomen:  soft, nontender  Extremities:  Lower extremities - No pretibial edema.   Neuro: MS is good with appropriate affect, pt is alert and Ox3    DM foot exam: 06/29/2023  The skin of the feet is intact without sores or ulcerations. The pedal pulses are 2+ on right and 2+ on left. The sensation is decreased  to a screening 5.07, 10 gram monofilament bilaterally    DATA REVIEWED:  Lab Results  Component Value Date   HGBA1C 6.3 (A) 12/22/2023   HGBA1C 6.8 (A) 06/29/2023   HGBA1C 6.9 (A) 02/23/2023    Latest Reference Range & Units 06/29/23 08:32  Sodium 135 - 146 mmol/L 139  Potassium 3.5 - 5.3 mmol/L  4.7  Chloride 98 - 110 mmol/L 105  CO2 20 - 32 mmol/L 26  Glucose 65 - 99 mg/dL 876 (H)  BUN 7 - 25 mg/dL 11  Creatinine 9.49 - 9.00 mg/dL 9.48  Calcium  8.6 - 10.2 mg/dL 9.4  BUN/Creatinine Ratio 6 - 22 (calc) SEE NOTE:  eGFR > OR = 60 mL/min/1.27m2 117  Total CHOL/HDL Ratio <5.0 (calc) 3.7  Cholesterol <200 mg/dL 857  HDL Cholesterol > OR = 50 mg/dL 38 (L)  LDL Cholesterol (Calc) mg/dL (calc) 79  MICROALB/CREAT RATIO <30 mg/g creat 20  Non-HDL Cholesterol (Calc) <130 mg/dL (calc) 895  Triglycerides <150 mg/dL 843 (H)    Latest Reference Range & Units 06/29/23 08:32  Microalb, Ur mg/dL 2.4  MICROALB/CREAT RATIO <30 mg/g creat 20  Creatinine, Urine 20 - 275 mg/dL 880    ASSESSMENT / PLAN / RECOMMENDATIONS:   1) Type 2 Diabetes Mellitus, Optimally  controlled, With microalbuminuria, neuropathic and macrovascular  complications - Most recent A1c of 6.3 %. Goal A1c < 7.0 %.     -A1c remains optimal at 6.3% -She has been on Trulicity  as well as Ozempic.  She does recall GI side effects to Trulicity , she does not recall the cause of discontinuation of Ozempic - She has not been able to check glucose, a prescription for Accu-Chek guide with extra strips as well as Dexcom has been sent to the pharmacy - She understands the risk of hyperglycemia with prednisone  intake, patient was strongly encouraged to limit carbohydrate intake is much as possible while on prednisone  - Will increase Mounjaro  - She has not been consistent with metformin  or Semglee  intake, I have asked her that she could take all 4 tablets of metformin  together once daily.  I will decrease Semglee  to prevent hypoglycemia  MEDICATIONS: Increase Mounjaro  7.5 mg weekly Continue metformin  500 mg XR, 4 tabs daily Decrease Semglee  20 units  once daily  EDUCATION / INSTRUCTIONS: BG monitoring instructions: Patient is instructed to check her blood sugars 1 times a day. Call [facility] if: BG persistently < 70   I reviewed the Rule of 15 for the treatment of hypoglycemia in detail with the patient. Literature supplied.   2) Diabetic complications:  Eye: Does not have known diabetic retinopathy.  Neuro/ Feet: Does  have known diabetic peripheral neuropathy. Renal: Patient does not have known baseline CKD. She is  on an ACEI/ARB at present.  3)Microalbuminuria :  -Patient on lisinopril  - Resolved  3) Dyslipidemia :  - Lipid panel shows optimization of LDL, triglycerides slightly elevated in the past - She is on rosuvastatin , no change   Medication Continue rosuvastatin  20 mg daily   4) Neuralgia:  -Patient was evaluated by urgent care for left facial pain, she is on Lyrica - Will defer this to PCP, may consider neurology referral through PCP if necessary - This is beyond the scope of endocrinology  Follow-up in 6 months  Signed electronically by: Stefano Redgie Butts, MD  [facility] End[facility]  [facility] [address] [place], [place] [address] Phone: [phone] FAX: [phone]   CC: Jaycee Greig PARAS, NP [address] [place] [address] Phone: [phone]  Fax: [phone]    Return to Endocrinology clinic as below: Future Appointments  Date Time Provider Department Center  12/22/2023  8:30 AM Sohana Austell, Donell Redgie, MD [id number] None

## 2023-12-22 NOTE — Patient Instructions (Addendum)
 Increase Mounjaro  7.5 mg weekly  Continue Metformin  500g XR , 4 tablets daily  Decrease Semglee  20 units daily    HOW TO TREAT LOW BLOOD SUGARS (Blood sugar LESS THAN 70 MG/DL) Please follow the RULE OF 15 for the treatment of hypoglycemia treatment (when your (blood sugars are less than 70 mg/dL)   STEP 1: Take 15 grams of carbohydrates when your blood sugar is low, which includes:  3-4 GLUCOSE TABS  OR 3-4 OZ OF JUICE OR REGULAR SODA OR ONE TUBE OF GLUCOSE GEL    STEP 2: RECHECK blood sugar in 15 MINUTES STEP 3: If your blood sugar is still low at the 15 minute recheck --> then, go back to STEP 1 and treat AGAIN with another 15 grams of carbohydrates.

## 2023-12-27 ENCOUNTER — Encounter: Payer: Self-pay | Admitting: Family

## 2023-12-27 ENCOUNTER — Ambulatory Visit: Admitting: Family

## 2023-12-27 VITALS — BP 126/84 | HR 81 | Temp 98.6°F | Resp 16 | Ht 62.5 in | Wt 168.8 lb

## 2023-12-27 DIAGNOSIS — G518 Other disorders of facial nerve: Secondary | ICD-10-CM

## 2023-12-27 NOTE — Progress Notes (Signed)
 Patient ID: [name] [name], female    DOB: [birth year]  MRN: [id number]  CC: Urgent Care Follow-Up  Subjective: [name] [name] is a 43 y.o. female who presents for Urgent Care follow-up.   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 Today reports facial pain persisting. Denies red flag symptoms. States she only took one dose of Prednisone  prescribed from Urgent Care due to Endocrinology told her not to take because it could affect her diabetes.   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   Hypertensive retinopathy of both eyes 11/08/2018   Nuclear age-related cataract, both eyes 11/08/2018   MDD (major depressive disorder), recurrent episode, moderate (HCC) 08/04/2018   PTSD (post-traumatic stress disorder) 08/04/2018   MDD (major depressive disorder), severe (HCC) 07/12/2018   GERD (gastroesophageal reflux disease) 02/17/2018   Type 2 diabetes mellitus without complication, without long-term current use of insulin  (HCC) 02/17/2018   S/P cesarean section 06/01/2014   S/P tubal ligation 06/01/2014   H/O cesarean section complicating pregnancy 04/18/2014   Emotional disorder 03/16/2013   Tobacco user 03/16/2013   Acute chest pain 03/01/2013   HTN (hypertension) 09/28/2012   DM (diabetes mellitus) (HCC) 11/06/2011     Medications Ordered Prior to Encounter[1]  Allergies[2]  Social History   Socioeconomic History   Marital status: Legally Separated    Spouse name: Not on file   Number of children: Not on file   Years of education: Not on file   Highest education level: GED or equivalent  Occupational History   Not on file  Tobacco Use   Smoking status: Former     Current packs/day: 0.00    Average packs/day: 1 pack/day for 12.0 years (12.0 ttl pk-yrs)    Types: Cigarettes    Start date: 06/09/2010    Quit date: 06/09/2022    Years since quitting: 1.5    Passive exposure: Current   Smokeless tobacco: Never  Vaping Use   Vaping status: Never Used  Substance and Sexual Activity   Alcohol use: No   Drug use: No   Sexual activity: Yes    Birth control/protection: None  Other Topics Concern   Not on file  Social History Narrative   Not on file   Social Drivers of Health   Tobacco Use: Medium Risk (12/27/2023)   Patient History    Smoking Tobacco Use: Former    Smokeless Tobacco Use: Never    Passive Exposure: Current  Physicist, Medical Strain: Patient Declined (12/26/2023)   Overall Financial Resource Strain (CARDIA)    Difficulty of Paying Living Expenses: Patient declined  Food Insecurity: Patient Declined (12/26/2023)   Epic    Worried About Programme Researcher, Broadcasting/film/video in the Last Year: Patient declined    Barista in the Last Year: Patient declined  Transportation Needs: Patient Declined (12/26/2023)   Epic    Lack of Transportation (Medical): Patient declined    Lack of Transportation (Non-Medical): Patient declined  Physical Activity: Sufficiently Active (12/26/2023)   Exercise Vital Sign    Days of Exercise per Week: 5 days    Minutes of Exercise per Session: 30 min  Stress: Stress Concern Present (12/26/2023)   [employer] - Occupational Stress Questionnaire    Feeling of Stress: Very much  Social Connections: Unknown (12/26/2023)   Social Connection and Isolation Panel    Frequency of Communication with Friends and Family: Patient declined    Frequency of Social Gatherings with Friends and Family: Patient declined    Attends Religious Services: Patient declined    Active Member of Clubs or Organizations: No    Attends Engineer, Structural: Not on file    Marital Status: Divorced   Intimate Partner Violence: Not At Risk (07/21/2023)   Epic    Fear of Current or Ex-Partner: No    Emotionally Abused: No    Physically Abused: No    Sexually Abused: No  Depression (PHQ2-9): Low Risk (12/27/2023)   Depression (PHQ2-9)    PHQ-2 Score: 0  Alcohol Screen: Low Risk (12/26/2023)   Alcohol Screen    Last Alcohol Screening Score (AUDIT): 1  Housing: Low Risk (12/26/2023)   Epic    Unable to Pay for Housing in the Last Year: No    Number of Times Moved in the Last Year: 0    Homeless in the Last Year: No  Utilities: Not At Risk (07/21/2023)   Epic    Threatened with loss of utilities: No  Health Literacy: Adequate Health Literacy (07/21/2023)   B1300 Health Literacy    Frequency of need for help with medical instructions: Never    Family History  Problem Relation Age of Onset   Diabetes Maternal Grandmother    Diabetes Paternal Grandmother     Past Surgical History:  Procedure Laterality Date   APPLICATION OF WOUND VAC Right 05/13/2022   Procedure: APPLICATION OF PREVENA WOUND VAC;  Surgeon: Serene Gaile ORN, MD;  Location: [facility] OR;  Service: Vascular;  Laterality: Right;   APPLICATION OF WOUND VAC Right 06/10/2022   Procedure: APPLICATION OF WOUND VAC;  Surgeon: Lanis Fonda BRAVO, MD;  Location: [facility] OR;  Service: Vascular;  Laterality: Right;   CESAREAN SECTION     CESAREAN SECTION WITH BILATERAL TUBAL LIGATION Bilateral 06/01/2014   Procedure: CESAREAN SECTION WITH BILATERAL TUBAL LIGATION;  Surgeon: Ezzie Buba, MD;  Location: [facility] ORS;  Service: Obstetrics;  Laterality: Bilateral;  MD requests RNFA   CHEST TUBE INSERTION     FEMORAL ARTERY EXPLORATION Right 05/13/2022   Procedure: RIGHTGROIN EXPLORATION , HEMATOMA EVACUATION;  Surgeon: Serene Gaile ORN, MD;  Location: [facility] OR;  Service: Vascular;  Laterality: Right;   FEMORAL-POPLITEAL BYPASS GRAFT Right 05/07/2022   Procedure: RIGHT FEMORAL-POPLITEAL ARTERY EMBOLECTOMY AND 4  COMPARMENT FASCIOTOMY;  Surgeon: Lanis Fonda BRAVO, MD;  Location: [facility] OR;  Service: Vascular;  Laterality: Right;   TONSILLECTOMY     TRACHEOSTOMY     WOUND EXPLORATION Right 06/10/2022   Procedure: IRRGATION AND DEBRIDMENT OF RIGHT GROIN;  Surgeon: Lanis Fonda BRAVO, MD;  Location: [facility] OR;  Service: Vascular;  Laterality: Right;    ROS: Review of Systems Negative except as stated above  PHYSICAL EXAM: BP 126/84   Pulse 81   Temp 98.6 F (37 C) (Oral)   Resp 16   Ht 5' 2.5 (1.588 m)   Wt 168 lb 12.8 oz (76.6 kg)   LMP 12/17/2023 (Exact Date)   SpO2 96%   BMI 30.38 kg/m   Physical Exam HENT:     Head: Normocephalic and atraumatic.     Nose: Nose normal.     Mouth/Throat:     Mouth: Mucous membranes are moist.  Pharynx: Oropharynx is clear.  Eyes:     Extraocular Movements: Extraocular movements intact.     Conjunctiva/sclera: Conjunctivae normal.     Pupils: Pupils are equal, round, and reactive to light.  Cardiovascular:     Rate and Rhythm: Normal rate and regular rhythm.     Pulses: Normal pulses.     Heart sounds: Normal heart sounds.  Pulmonary:     Effort: Pulmonary effort is normal.     Breath sounds: Normal breath sounds.  Musculoskeletal:        General: Normal range of motion.     Cervical back: Normal range of motion and neck supple.  Neurological:     General: No focal deficit present.     Mental Status: She is alert and oriented to person, place, and time.  Psychiatric:        Mood and Affect: Mood normal.        Behavior: Behavior normal.     ASSESSMENT AND PLAN: 1. 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 - Ambulatory referral to Neurology   Patient was given the opportunity to ask questions.  Patient verbalized understanding of the plan and was able to repeat key elements of the plan. Patient was given clear instructions to go to Emergency Department or return to medical center if symptoms don't improve, worsen, or new  problems develop.The patient verbalized understanding.   Orders Placed This Encounter  Procedures   Ambulatory referral to Neurology    Return for Follow-up as needed.  Greig JINNY Chute, NP      [1]  Current Outpatient Medications on File Prior to Visit  Medication Sig Dispense Refill   acetaminophen  (TYLENOL ) 500 MG tablet Take 500-1,000 mg by mouth every 6 (six) hours as needed (pain.).     aspirin  EC 81 MG tablet Take 1 tablet (81 mg total) by mouth daily at 6 (six) AM. Swallow whole. 30 tablet 12   Blood Glucose Monitoring Suppl (ACCU-CHEK GUIDE) w/Device KIT 1 each by Other route daily in the afternoon. 1 kit 0   Continuous Glucose Sensor (DEXCOM G7 SENSOR) MISC 1 Device by Does not apply route as directed. 9 each 3   FLUoxetine  (PROZAC ) 20 MG tablet Take 1 tablet (20 mg total) by mouth daily. 90 tablet 0   gabapentin  (NEURONTIN ) 300 MG capsule TAKE 1 CAPSULE BY MOUTH THREE TIMES DAILY 270 capsule 0   glucose blood (ACCU-CHEK GUIDE TEST) test strip 1 each by Other route daily in the afternoon. Use as instructed 100 each 12   insulin  glargine-yfgn (SEMGLEE ) 100 UNIT/ML Pen Inject 20 Units into the skin at bedtime. 30 mL 4   Insulin  Pen Needle 32G X 4 MM MISC 1 Device by Does not apply route daily in the afternoon. 100 each 3   Lancets (ACCU-CHEK MULTICLIX) lancets Use as instructed 100 each 12   lisinopril  (ZESTRIL ) 40 MG tablet Take 1 tablet by mouth once daily 90 tablet 0   lisinopril  (ZESTRIL ) 40 MG tablet Take 1 tablet (40 mg total) by mouth daily. 90 tablet 0   metFORMIN  (GLUCOPHAGE -XR) 500 MG 24 hr tablet Take 4 tablets (2,000 mg total) by mouth daily with breakfast. 360 tablet 3   methocarbamol  (ROBAXIN ) 500 MG tablet Take 1 tablet (500 mg total) by mouth 2 (two) times daily as needed for muscle spasms. 20 tablet 0   ondansetron  (ZOFRAN ) 4 MG tablet Take 1 tablet (4 mg total) by mouth every 8 (  eight) hours as needed for nausea or vomiting. 20 tablet 0   predniSONE  (STERAPRED  UNI-PAK 21 TAB) 10 MG (21) TBPK tablet Take as directed on back of package 21 tablet 0   rivaroxaban  (XARELTO ) 20 MG TABS tablet Take 1 tablet (20 mg total) by mouth daily with supper. 90 tablet 3   rosuvastatin  (CRESTOR ) 20 MG tablet Take 1 tablet (20 mg total) by mouth daily. 90 tablet 3   tirzepatide  (MOUNJARO ) 7.5 MG/0.5ML Pen Inject 7.5 mg into the skin once a week. 6 mL 3   No current facility-administered medications on file prior to visit.  [2]  Allergies Allergen Reactions   Sulfa Antibiotics Hives

## 2023-12-27 NOTE — Progress Notes (Signed)
 Neurology referral pain on left side of face, arm and going into her shoulder

## 2023-12-30 ENCOUNTER — Encounter: Payer: Self-pay | Admitting: Neurology

## 2023-12-30 VITALS — BP 138/72 | HR 91 | Ht 62.0 in | Wt 168.5 lb

## 2023-12-30 DIAGNOSIS — G5132 Clonic hemifacial spasm, left: Secondary | ICD-10-CM

## 2023-12-30 DIAGNOSIS — G500 Trigeminal neuralgia: Secondary | ICD-10-CM | POA: Diagnosis not present

## 2023-12-30 MED ORDER — CARBAMAZEPINE ER 200 MG PO TB12: 200.0000 mg | ORAL_TABLET | Freq: Two times a day (BID) | ORAL | 1 refills | Status: AC

## 2023-12-30 MED ORDER — PREGABALIN 150 MG PO CAPS: 150.0000 mg | ORAL_CAPSULE | Freq: Two times a day (BID) | ORAL | 5 refills | Status: AC

## 2023-12-30 MED ORDER — DULOXETINE HCL 30 MG PO CPEP: 30.0000 mg | ORAL_CAPSULE | Freq: Every day | ORAL | 1 refills | Status: AC

## 2023-12-30 NOTE — Patient Instructions (Addendum)
 Will switch Gabapentin  to Pregabalin  150 mg twice daily  Will switch fluoxetine  to duloxetine  30 mg daily  Will add Carbamazepine  200 mg twice daily  Will obtain MRI face trigeminal nerve Follow-up in 6 months or sooner if worse

## 2023-12-30 NOTE — Progress Notes (Signed)
 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  Patient reports the symptoms started since 2005.  She believes this was a result of a head trauma.  This symptom was initially managed with gabapentin  but lately she has been having worsening pain and worsening spasm.  Cold air, touching her face, showering makes the pain worse.  Denies any change in her vision.  She was recently seen in the ED for the same. She tells me at 1 point someone told her that a blood vessel was touching her facial nerve causing her symptoms.  She has not followed up with neurosurgery.   OTHER MEDICAL CONDITIONS: Anxiety/depression, bipolar disorder, hypertension, GERD, diabetes.   REVIEW OF SYSTEMS: Full 14 system review of systems performed and negative with exception of: As noted in the HPI   ALLERGIES: Allergies[1]  HOME MEDICATIONS: Outpatient Medications Prior to Visit  Medication Sig Dispense Refill   acetaminophen  (TYLENOL ) 500 MG tablet Take 500-1,000 mg by mouth every 6 (six) hours as needed (pain.).     aspirin  EC 81 MG tablet Take 1 tablet (81 mg total) by mouth daily at 6 (six) AM. Swallow whole. 30 tablet 12   Blood Glucose Monitoring Suppl (ACCU-CHEK GUIDE) w/Device KIT 1 each by Other route daily in the afternoon. 1 kit 0   Continuous Glucose Sensor (DEXCOM G7 SENSOR) MISC 1 Device by Does not apply route as directed. 9  each 3   glucose blood (ACCU-CHEK GUIDE TEST) test strip 1 each by Other route daily in the afternoon. Use as instructed 100 each 12   insulin  glargine-yfgn (SEMGLEE ) 100 UNIT/ML Pen Inject 20 Units into the skin at bedtime. 30 mL 4   Insulin  Pen Needle 32G X 4 MM MISC 1 Device by Does not apply route daily in the afternoon. 100 each 3   Lancets (ACCU-CHEK MULTICLIX) lancets Use as instructed 100 each 12   lisinopril  (ZESTRIL ) 40 MG tablet Take 1 tablet by mouth once daily 90 tablet 0   metFORMIN  (GLUCOPHAGE -XR) 500 MG 24 hr tablet Take 4 tablets (2,000 mg total) by mouth daily with breakfast. 360 tablet 3   methocarbamol  (ROBAXIN ) 500 MG tablet Take 1 tablet (500 mg total) by mouth 2 (two) times daily as needed for muscle spasms. 20 tablet 0   ondansetron  (ZOFRAN ) 4 MG tablet Take 1 tablet (4 mg total) by mouth every 8 (eight) hours as needed for nausea or vomiting. 20 tablet 0   predniSONE  (STERAPRED UNI-PAK 21 TAB) 10 MG (21) TBPK tablet Take as directed on back of package 21 tablet 0   rivaroxaban  (XARELTO ) 20 MG TABS tablet Take 1 tablet (20 mg total) by mouth daily with supper. 90 tablet 3   rosuvastatin  (CRESTOR ) 20 MG tablet Take 1 tablet (20 mg total) by mouth daily. 90 tablet 3   tirzepatide  (MOUNJARO ) 7.5 MG/0.5ML Pen Inject 7.5 mg into the skin once a week. 6 mL 3   FLUoxetine  (PROZAC ) 20  MG tablet Take 1 tablet (20 mg total) by mouth daily. 90 tablet 0   gabapentin  (NEURONTIN ) 300 MG capsule TAKE 1 CAPSULE BY MOUTH THREE TIMES DAILY 270 capsule 0   lisinopril  (ZESTRIL ) 40 MG tablet Take 1 tablet (40 mg total) by mouth daily. 90 tablet 0   No facility-administered medications prior to visit.    PAST MEDICAL HISTORY: Past Medical History:  Diagnosis Date   Anxiety    Back fracture    Bipolar disorder (HCC)    Depression    Diabetes mellitus without complication (HCC)    Family history of adverse reaction to anesthesia    sister gets nausea and vomitting and itching on her  face. Around her nose and mouth.   Fatty liver disease, nonalcoholic    GERD (gastroesophageal reflux disease)    H/O cesarean section complicating pregnancy 04/18/2014   Hypertension    Neuromuscular disorder (HCC)    eye twitch on left eye    Obesity    Pelvis fracture (HCC)    Pneumonia 2021   S/P cesarean section 06/01/2014   S/P tubal ligation 06/01/2014    PAST SURGICAL HISTORY: Past Surgical History:  Procedure Laterality Date   APPLICATION OF WOUND VAC Right 05/13/2022   Procedure: APPLICATION OF PREVENA WOUND VAC;  Surgeon: Serene Gaile ORN, MD;  Location: [facility] OR;  Service: Vascular;  Laterality: Right;   APPLICATION OF WOUND VAC Right 06/10/2022   Procedure: APPLICATION OF WOUND VAC;  Surgeon: Lanis Fonda BRAVO, MD;  Location: [facility] OR;  Service: Vascular;  Laterality: Right;   CESAREAN SECTION     CESAREAN SECTION WITH BILATERAL TUBAL LIGATION Bilateral 06/01/2014   Procedure: CESAREAN SECTION WITH BILATERAL TUBAL LIGATION;  Surgeon: Ezzie Buba, MD;  Location: [facility] ORS;  Service: Obstetrics;  Laterality: Bilateral;  MD requests RNFA   CHEST TUBE INSERTION     FEMORAL ARTERY EXPLORATION Right 05/13/2022   Procedure: RIGHTGROIN EXPLORATION , HEMATOMA EVACUATION;  Surgeon: Serene Gaile ORN, MD;  Location: [facility] OR;  Service: Vascular;  Laterality: Right;   FEMORAL-POPLITEAL BYPASS GRAFT Right 05/07/2022   Procedure: RIGHT FEMORAL-POPLITEAL ARTERY EMBOLECTOMY AND 4  COMPARMENT FASCIOTOMY;  Surgeon: Lanis Fonda BRAVO, MD;  Location: [facility] OR;  Service: Vascular;  Laterality: Right;   TONSILLECTOMY     TRACHEOSTOMY     WOUND EXPLORATION Right 06/10/2022   Procedure: OSA AND DEBRIDMENT OF RIGHT GROIN;  Surgeon: Lanis Fonda BRAVO, MD;  Location: [facility] OR;  Service: Vascular;  Laterality: Right;    FAMILY HISTORY: Family History  Problem Relation Age of Onset   Diabetes Maternal Grandmother    Diabetes Paternal Grandmother     SOCIAL HISTORY: Social History   Socioeconomic  History   Marital status: Legally Separated    Spouse name: Not on file   Number of children: Not on file   Years of education: Not on file   Highest education level: GED or equivalent  Occupational History   Not on file  Tobacco Use   Smoking status: Former    Current packs/day: 0.00    Average packs/day: 1 pack/day for 12.0 years (12.0 ttl pk-yrs)    Types: Cigarettes    Start date: 06/09/2010    Quit date: 06/09/2022    Years since quitting: 1.5    Passive exposure: Current   Smokeless tobacco: Never  Vaping Use   Vaping status: Never Used  Substance and Sexual Activity   Alcohol use: No   Drug use: No   Sexual activity: Yes  Birth control/protection: None  Other Topics Concern   Not on file  Social History Narrative   Not on file   Social Drivers of Health   Tobacco Use: Medium Risk (12/30/2023)   Patient History    Smoking Tobacco Use: Former    Smokeless Tobacco Use: Never    Passive Exposure: Current  Physicist, Medical Strain: Patient Declined (12/26/2023)   Overall Financial Resource Strain (CARDIA)    Difficulty of Paying Living Expenses: Patient declined  Food Insecurity: Patient Declined (12/26/2023)   Epic    Worried About Programme Researcher, Broadcasting/film/video in the Last Year: Patient declined    Barista in the Last Year: Patient declined  Transportation Needs: Patient Declined (12/26/2023)   Epic    Lack of Transportation (Medical): Patient declined    Lack of Transportation (Non-Medical): Patient declined  Physical Activity: Sufficiently Active (12/26/2023)   Exercise Vital Sign    Days of Exercise per Week: 5 days    Minutes of Exercise per Session: 30 min  Stress: Stress Concern Present (12/26/2023)   [employer] - Occupational Stress Questionnaire    Feeling of Stress: Very much  Social Connections: Unknown (12/26/2023)   Social Connection and Isolation Panel    Frequency of Communication with Friends and Family: Patient  declined    Frequency of Social Gatherings with Friends and Family: Patient declined    Attends Religious Services: Patient declined    Active Member of Clubs or Organizations: No    Attends Engineer, Structural: Not on file    Marital Status: Divorced  Intimate Partner Violence: Not At Risk (07/21/2023)   Epic    Fear of Current or Ex-Partner: No    Emotionally Abused: No    Physically Abused: No    Sexually Abused: No  Depression (PHQ2-9): Low Risk (12/27/2023)   Depression (PHQ2-9)    PHQ-2 Score: 0  Alcohol Screen: Low Risk (12/26/2023)   Alcohol Screen    Last Alcohol Screening Score (AUDIT): 1  Housing: Low Risk (12/26/2023)   Epic    Unable to Pay for Housing in the Last Year: No    Number of Times Moved in the Last Year: 0    Homeless in the Last Year: No  Utilities: Not At Risk (07/21/2023)   Epic    Threatened with loss of utilities: No  Health Literacy: Adequate Health Literacy (07/21/2023)   B1300 Health Literacy    Frequency of need for help with medical instructions: Never    PHYSICAL EXAM  GENERAL EXAM/CONSTITUTIONAL: Vitals:  Vitals:   12/30/23 0810 12/30/23 0816  BP: (!) 145/75 138/72  Pulse: 91   SpO2: 98%   Weight: 168 lb 8 oz (76.4 kg)   Height: 5' 2 (1.575 m)    Body mass index is 30.82 kg/m. Wt Readings from Last 3 Encounters:  12/30/23 168 lb 8 oz (76.4 kg)  12/27/23 168 lb 12.8 oz (76.6 kg)  12/22/23 168 lb (76.2 kg)   Patient is in no distress; well developed, nourished and groomed; neck is supple  MUSCULOSKELETAL: Gait, strength, tone, movements noted in Neurologic exam below  NEUROLOGIC: MENTAL STATUS:      No data to display         awake, alert, oriented to person, place and time recent and remote memory intact normal attention and concentration language fluent, comprehension intact, naming intact fund of knowledge appropriate  CRANIAL NERVE:  2nd, 3rd, 4th, 6th - Visual fields full  to confrontation, extraocular  muscles intact, no nystagmus 5th - facial sensation symmetric There is hemifacial spasms involving the left face,  8th - hearing intact 9th - palate elevates symmetrically, uvula midline 11th - shoulder shrug symmetric 12th - tongue protrusion midline  MOTOR:  normal bulk and tone, full strength in the BUE, BLE  SENSORY:  normal and symmetric to light touch  COORDINATION:  finger-nose-finger, fine finger movements normal  GAIT/STATION:  normal     DIAGNOSTIC DATA (LABS, IMAGING, TESTING) - I reviewed patient records, labs, notes, testing and imaging myself where available.  Lab Results  Component Value Date   WBC 8.5 06/13/2022   HGB 11.3 (L) 06/13/2022   HCT 34.8 (L) 06/13/2022   MCV 85.7 06/13/2022   PLT 302 06/13/2022      Component Value Date/Time   NA 139 06/29/2023 0832   NA 138 07/15/2022 1406   K 4.7 06/29/2023 0832   CL 105 06/29/2023 0832   CO2 26 06/29/2023 0832   GLUCOSE 123 (H) 06/29/2023 0832   BUN 11 06/29/2023 0832   BUN 11 07/15/2022 1406   CREATININE 0.51 06/29/2023 0832   CALCIUM  9.4 06/29/2023 0832   PROT 6.4 (L) 06/10/2022 1055   PROT 6.4 08/06/2021 1613   ALBUMIN  2.9 (L) 06/10/2022 1055   ALBUMIN  4.0 08/06/2021 1613   AST 11 (L) 06/10/2022 1055   ALT 11 06/10/2022 1055   ALKPHOS 50 06/10/2022 1055   BILITOT 0.4 06/10/2022 1055   BILITOT <0.2 08/06/2021 1613   GFRNONAA >60 06/13/2022 0105   GFRAA >60 07/11/2018 2016   Lab Results  Component Value Date   CHOL 142 06/29/2023   HDL 38 (L) 06/29/2023   LDLCALC 79 06/29/2023   TRIG 156 (H) 06/29/2023   CHOLHDL 3.7 06/29/2023   Lab Results  Component Value Date   HGBA1C 6.3 (A) 12/22/2023   No results found for: [id number] Lab Results  Component Value Date   TSH 0.759 07/12/2018     ASSESSMENT AND PLAN  43 y.o. year old female with multiple medical conditions including anxiety/depression, bipolar disorder, diabetes, GERD who is presenting with worsening of her left  hemifacial spasm and trigeminal neuralgia pain.  She is currently on gabapentin  300 mg 3 times daily but states that the symptoms are not improved.  She was previously told that she had a blood vessel touching the nerve.  Plan will be to change some of her medication, will switch gabapentin  to pregabalin , switch fluoxetine  to duloxetine  and add carbamazepine .  I will also repeat her MRI face and trigeminal nerve I will contact the patient to go over the results of the MRI but she understands to contact me sooner if her symptoms do get worse.  We will see her in 6 months for follow-up or sooner if worse.   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 Trigeminal neuralgia pain      Patient Instructions  Will switch Gabapentin  to Pregabalin  150 mg twice daily  Will switch fluoxetine  to duloxetine  30 mg daily  Will add Carbamazepine  200 mg twice daily  Will obtain MRI face trigeminal nerve Follow-up in 6 months or sooner if worse  Orders Placed This Encounter  Procedures   MR BRAIN W WO CONTRAST   MR FACE/TRIGEMINAL WO/W CM    Meds ordered this encounter  Medications   pregabalin  (LYRICA ) 150 MG capsule    Sig: Take 1 capsule (150 mg total) by mouth 2 (two) times daily.  Dispense:  90 capsule    Refill:  5   DULoxetine  (CYMBALTA ) 30 MG capsule    Sig: Take 1 capsule (30 mg total) by mouth daily.    Dispense:  90 capsule    Refill:  1   carbamazepine  (TEGRETOL -XR) 200 MG 12 hr tablet    Sig: Take 1 tablet (200 mg total) by mouth 2 (two) times daily.    Dispense:  180 tablet    Refill:  1    Return in about 6 months (around 06/29/2024).    Pastor Falling, MD 12/30/2023, 8:57 AM  [facility] [address], [address] [place], [place] [address] [phone]     [1]  Allergies Allergen Reactions   Sulfa Antibiotics Hives

## 2024-01-04 ENCOUNTER — Ambulatory Visit

## 2024-01-04 ENCOUNTER — Telehealth: Payer: Self-pay | Admitting: Neurology

## 2024-01-04 DIAGNOSIS — G500 Trigeminal neuralgia: Secondary | ICD-10-CM

## 2024-01-04 DIAGNOSIS — G5132 Clonic hemifacial spasm, left: Secondary | ICD-10-CM | POA: Diagnosis not present

## 2024-01-04 MED ORDER — GADOBENATE DIMEGLUMINE 529 MG/ML IV SOLN
15.0000 mL | Freq: Once | INTRAVENOUS | Status: AC | PRN
  Administered 2024-01-04: 15 mL via INTRAVENOUS

## 2024-01-04 NOTE — Telephone Encounter (Signed)
 Her MRI is today at [facility].

## 2024-01-09 ENCOUNTER — Ambulatory Visit: Payer: Self-pay | Admitting: Neurology

## 2024-01-09 NOTE — Progress Notes (Signed)
 [name] [name] [name]  Your recent face/Trigeminal MRI did not show acute findings, no mass and no compression of the nerves. No further action is required on this test at this time. Please keep any upcoming appointments or tests and  call us  with any interim questions, concerns, problems or updates. Thanks,   Pastor Falling, MD

## 2024-01-25 ENCOUNTER — Ambulatory Visit: Admitting: Family

## 2024-06-21 ENCOUNTER — Ambulatory Visit: Admitting: Internal Medicine

## 2024-08-01 ENCOUNTER — Ambulatory Visit: Admitting: Neurology
